# Patient Record
Sex: Female | Born: 1950
Health system: Southern US, Community
[De-identification: ages and names within clinical notes are randomized; demographics above are authoritative.]

## PROBLEM LIST (undated history)

## (undated) DIAGNOSIS — I493 Ventricular premature depolarization: Secondary | ICD-10-CM

## (undated) DIAGNOSIS — R490 Dysphonia: Secondary | ICD-10-CM

## (undated) DIAGNOSIS — K219 Gastro-esophageal reflux disease without esophagitis: Secondary | ICD-10-CM

## (undated) DIAGNOSIS — G4762 Sleep related leg cramps: Secondary | ICD-10-CM

## (undated) DIAGNOSIS — J040 Acute laryngitis: Secondary | ICD-10-CM

## (undated) DIAGNOSIS — K5792 Diverticulitis of intestine, part unspecified, without perforation or abscess without bleeding: Secondary | ICD-10-CM

## (undated) DIAGNOSIS — E785 Hyperlipidemia, unspecified: Secondary | ICD-10-CM

## (undated) DIAGNOSIS — J3089 Other allergic rhinitis: Secondary | ICD-10-CM

## (undated) DIAGNOSIS — H269 Unspecified cataract: Secondary | ICD-10-CM

## (undated) DIAGNOSIS — M5030 Other cervical disc degeneration, unspecified cervical region: Secondary | ICD-10-CM

## (undated) DIAGNOSIS — R0982 Postnasal drip: Secondary | ICD-10-CM

## (undated) DIAGNOSIS — M810 Age-related osteoporosis without current pathological fracture: Secondary | ICD-10-CM

## (undated) DIAGNOSIS — E039 Hypothyroidism, unspecified: Secondary | ICD-10-CM

## (undated) DIAGNOSIS — E559 Vitamin D deficiency, unspecified: Secondary | ICD-10-CM

## (undated) DIAGNOSIS — E079 Disorder of thyroid, unspecified: Secondary | ICD-10-CM

## (undated) DIAGNOSIS — N393 Stress incontinence (female) (male): Secondary | ICD-10-CM

## (undated) DIAGNOSIS — F329 Major depressive disorder, single episode, unspecified: Secondary | ICD-10-CM

## (undated) HISTORY — DX: Postnasal drip: R09.82

## (undated) HISTORY — DX: Vitamin D deficiency, unspecified: E55.9

## (undated) HISTORY — DX: Stress incontinence (female) (male): N39.3

## (undated) HISTORY — DX: Acute laryngitis: J04.0

## (undated) HISTORY — PX: OTHER SURGICAL HISTORY: SHX169

## (undated) HISTORY — DX: Other allergic rhinitis: J30.89

## (undated) HISTORY — DX: Ventricular premature depolarization: I49.3

## (undated) HISTORY — DX: Hypothyroidism, unspecified: E03.9

## (undated) HISTORY — DX: Hyperlipidemia, unspecified: E78.5

## (undated) HISTORY — DX: Sleep related leg cramps: G47.62

## (undated) HISTORY — DX: Unspecified cataract: H26.9

## (undated) HISTORY — PX: TONSILLECTOMY: SUR1361

## (undated) HISTORY — PX: WISDOM TOOTH EXTRACTION: SHX21

## (undated) HISTORY — PX: BREAST SURGERY: SHX581

## (undated) HISTORY — DX: Dysphonia: R49.0

## (undated) HISTORY — DX: Major depressive disorder, single episode, unspecified: F32.9

## (undated) HISTORY — DX: Other cervical disc degeneration, unspecified cervical region: M50.30

## (undated) HISTORY — PX: LIPOSUCTION: SHX10

## (undated) HISTORY — DX: Age-related osteoporosis without current pathological fracture: M81.0

## (undated) HISTORY — DX: Gastro-esophageal reflux disease without esophagitis: K21.9

---

## 1981-11-18 HISTORY — PX: COMBINED HYSTERECTOMY VAGINAL W/ MMK / A&P REPAIR: SUR296

## 1989-11-18 HISTORY — PX: BREAST EXCISIONAL BIOPSY: SUR124

## 1998-04-20 ENCOUNTER — Other Ambulatory Visit: Admission: RE | Admit: 1998-04-20 | Discharge: 1998-04-20 | Payer: Self-pay | Admitting: Obstetrics and Gynecology

## 1999-04-20 ENCOUNTER — Other Ambulatory Visit: Admission: RE | Admit: 1999-04-20 | Discharge: 1999-04-20 | Payer: Self-pay | Admitting: Obstetrics and Gynecology

## 2001-02-18 ENCOUNTER — Ambulatory Visit (HOSPITAL_BASED_OUTPATIENT_CLINIC_OR_DEPARTMENT_OTHER): Admission: RE | Admit: 2001-02-18 | Discharge: 2001-02-18 | Payer: Self-pay | Admitting: *Deleted

## 2001-02-18 ENCOUNTER — Encounter (INDEPENDENT_AMBULATORY_CARE_PROVIDER_SITE_OTHER): Payer: Self-pay | Admitting: *Deleted

## 2001-07-03 ENCOUNTER — Other Ambulatory Visit: Admission: RE | Admit: 2001-07-03 | Discharge: 2001-07-03 | Payer: Self-pay | Admitting: Obstetrics and Gynecology

## 2004-09-24 ENCOUNTER — Ambulatory Visit (HOSPITAL_COMMUNITY): Admission: RE | Admit: 2004-09-24 | Discharge: 2004-09-24 | Payer: Self-pay | Admitting: *Deleted

## 2004-11-18 DIAGNOSIS — F32A Depression, unspecified: Secondary | ICD-10-CM

## 2004-11-18 DIAGNOSIS — M5030 Other cervical disc degeneration, unspecified cervical region: Secondary | ICD-10-CM

## 2004-11-18 HISTORY — DX: Depression, unspecified: F32.A

## 2004-11-18 HISTORY — DX: Other cervical disc degeneration, unspecified cervical region: M50.30

## 2005-03-29 ENCOUNTER — Encounter: Admission: RE | Admit: 2005-03-29 | Discharge: 2005-03-29 | Payer: Self-pay | Admitting: *Deleted

## 2005-06-27 ENCOUNTER — Encounter: Admission: RE | Admit: 2005-06-27 | Discharge: 2005-06-27 | Payer: Self-pay | Admitting: *Deleted

## 2005-10-14 ENCOUNTER — Encounter: Payer: Self-pay | Admitting: Obstetrics and Gynecology

## 2006-09-18 DIAGNOSIS — E559 Vitamin D deficiency, unspecified: Secondary | ICD-10-CM

## 2006-09-18 HISTORY — DX: Vitamin D deficiency, unspecified: E55.9

## 2006-10-15 ENCOUNTER — Encounter: Admission: RE | Admit: 2006-10-15 | Discharge: 2006-10-15 | Payer: Self-pay | Admitting: Obstetrics and Gynecology

## 2006-10-18 DIAGNOSIS — N393 Stress incontinence (female) (male): Secondary | ICD-10-CM

## 2006-10-18 HISTORY — DX: Stress incontinence (female) (male): N39.3

## 2007-11-11 ENCOUNTER — Encounter: Admission: RE | Admit: 2007-11-11 | Discharge: 2007-11-11 | Payer: Self-pay | Admitting: Obstetrics and Gynecology

## 2008-01-22 ENCOUNTER — Encounter: Admission: RE | Admit: 2008-01-22 | Discharge: 2008-01-22 | Payer: Self-pay | Admitting: Obstetrics and Gynecology

## 2008-01-25 ENCOUNTER — Ambulatory Visit (HOSPITAL_COMMUNITY): Admission: RE | Admit: 2008-01-25 | Discharge: 2008-01-25 | Payer: Self-pay | Admitting: *Deleted

## 2008-02-12 ENCOUNTER — Ambulatory Visit (HOSPITAL_COMMUNITY): Admission: RE | Admit: 2008-02-12 | Discharge: 2008-02-12 | Payer: Self-pay | Admitting: *Deleted

## 2008-02-12 ENCOUNTER — Encounter (INDEPENDENT_AMBULATORY_CARE_PROVIDER_SITE_OTHER): Payer: Self-pay | Admitting: *Deleted

## 2008-07-28 ENCOUNTER — Ambulatory Visit (HOSPITAL_COMMUNITY): Admission: RE | Admit: 2008-07-28 | Discharge: 2008-07-28 | Payer: Self-pay | Admitting: *Deleted

## 2008-10-07 ENCOUNTER — Encounter: Admission: RE | Admit: 2008-10-07 | Discharge: 2008-10-07 | Payer: Self-pay | Admitting: Obstetrics & Gynecology

## 2008-11-04 ENCOUNTER — Ambulatory Visit (HOSPITAL_COMMUNITY): Admission: RE | Admit: 2008-11-04 | Discharge: 2008-11-04 | Payer: Self-pay | Admitting: *Deleted

## 2008-11-07 ENCOUNTER — Ambulatory Visit (HOSPITAL_BASED_OUTPATIENT_CLINIC_OR_DEPARTMENT_OTHER): Admission: RE | Admit: 2008-11-07 | Discharge: 2008-11-07 | Payer: Self-pay | Admitting: *Deleted

## 2009-05-15 ENCOUNTER — Ambulatory Visit (HOSPITAL_COMMUNITY): Admission: RE | Admit: 2009-05-15 | Discharge: 2009-05-15 | Payer: Self-pay | Admitting: *Deleted

## 2009-05-26 ENCOUNTER — Encounter: Admission: RE | Admit: 2009-05-26 | Discharge: 2009-05-26 | Payer: Self-pay | Admitting: Obstetrics and Gynecology

## 2009-10-18 HISTORY — PX: HAND SURGERY: SHX662

## 2009-11-01 ENCOUNTER — Encounter: Admission: RE | Admit: 2009-11-01 | Discharge: 2009-11-01 | Payer: Self-pay | Admitting: Obstetrics and Gynecology

## 2010-02-16 ENCOUNTER — Encounter: Admission: RE | Admit: 2010-02-16 | Discharge: 2010-02-16 | Payer: Self-pay | Admitting: Obstetrics and Gynecology

## 2010-11-08 ENCOUNTER — Encounter
Admission: RE | Admit: 2010-11-08 | Discharge: 2010-11-08 | Payer: Self-pay | Source: Home / Self Care | Attending: Obstetrics and Gynecology | Admitting: Obstetrics and Gynecology

## 2010-12-09 ENCOUNTER — Encounter: Payer: Self-pay | Admitting: *Deleted

## 2011-04-02 NOTE — Op Note (Signed)
NAMEDUBLIN, CANTERO NO.:  [id number]   MEDICAL RECORD NO.:  [id number]          PATIENT TYPE:  AMB   LOCATION:  ENDO                         FACILITY:  [facility]   PHYSICIAN:  Georgiana Spinner, M.D.    DATE OF BIRTH:  [birth year]   DATE OF PROCEDURE:  05/15/2009  DATE OF DISCHARGE:                               OPERATIVE REPORT   PROCEDURE:  Colonoscopy.   INDICATIONS:  Diarrhea.   ANESTHESIA:  Fentanyl 100 mcg, Versed 12 mg.   PROCEDURE:  With the patient in the left lateral decubitus position  before sedation was given, a rectal examination was performed.  Her  rectal tone was mild to moderately decreased on intentional contracture.  Subsequently, the Pentax videoscopic pediatric colonoscope was inserted  in the rectum after sedation was given and passed under direct vision  with pressure applied to reach the cecum identified by ileocecal valve  and appendiceal orifice, both of which were photographed.  From this  point, the colonoscope was slowly withdrawn taking circumferential views  of colonic mucosa stopping only in the rectum to photograph an  occasional diverticulum along the way until we reached the rectum which  appeared normal on direct and showed hemorrhoids on retroflexed view.  The endoscope was straightened and withdrawn.  The patient's vital signs  and pulse oximeter remained stable.  The patient tolerated the procedure  well without apparent complication.   FINDINGS:  Small internal hemorrhoids, occasional diverticulum,  otherwise a fairly unremarkable exam.   PLAN:  Have the patient follow up with me as needed.  Suggest Kegel  exercises.           ______________________________  Georgiana Spinner, M.D.     GMO/MEDQ  D:  05/15/2009  T:  05/15/2009  Job:  [id number]

## 2011-04-02 NOTE — Op Note (Signed)
Sharon Harrell, Sharon Harrell                ACCOUNT NO.:  [id number]   MEDICAL RECORD NO.:  [id number]          PATIENT TYPE:  AMB   LOCATION:  DSC                          FACILITY:  [facility]   PHYSICIAN:  Tennis Must Meyerdierks, M.D.DATE OF BIRTH:  September 14, 1951   DATE OF PROCEDURE:  11/07/2008  DATE OF DISCHARGE:                               OPERATIVE REPORT   PREOPERATIVE DIAGNOSIS:  Scapholunate ligament tear, left wrist.   POSTOPERATIVE DIAGNOSIS:  Scapholunate ligament tear, left wrist.   PROCEDURE:  Arthroscopic debridement with percutaneous pinning of  scapholunate joint, left wrist.   SURGEON:  Lowell Bouton, MD   ANESTHESIA:  General.   OPERATIVE FINDINGS:  The patient had a moderate amount of dorsal  synovitis.  The scapholunate ligament was torn in the central portion.  There was no instability of the scapholunate joint.  The tear did appear  to be subacute as opposed to chronic as there were no degenerative  changes.  The radiocarpal joint was completely smooth as was the  triangular fiber cartilage and the midcarpal joint.   PROCEDURE:  Under general anesthesia with a tourniquet on the left arm,  the left hand was prepped and draped in usual fashion and the index,  long, and ring fingers were suspended from finger traps in the  arthroscopy tower.  Ten pounds of traction was applied across the wrist.  The radiocarpal joint was inflated with 10 mL of fluid and an outflow  was established without difficulty in the 6U portal.  An 11 blade was  used to make a stab wound over the dorsal 3/4 portal and blunt  dissection was carried down to the capsule.  A sharp trocar and blunt  trocar were used to get the arthroscope into the radiocarpal joint.  A  pump was attached for fluid and there was good flow.  Initial  examination revealed the radiocarpal joint to be smooth and the volar  ligaments to be intact.  Going ulnarly, the TFC was intact and the  articular surface of  the scaphoid lunate and radius were quite smooth.  A small scapholunate tear was identified and so a shaver was inserted in  a 4/5 portal.  The synovitis was debrided and a nerve hook was inserted  in the 4/5 portal.  The tear was examined with the nerve hook and there  was no gross instability noted.  The scaphoid was stressed manually and  there was no instability that could be seen through the arthroscope.  At  this point, the arthroscope was removed and then through the same, skin  incision was passed into the midcarpal portal.  The scapholunate joint  was examined from the midcarpal portal and the articular surfaces of the  capitate, scaphoid, and lunate were smooth.  A probe was placed in the  ulnar mid carpal portal and the scapholunate joint was examined with the  probe.  There was no widening or opening for the probe to go through.  There was poor outflow through the ulnocarpal outflow with the pump  inflating the midcarpal joint.  The midcarpal portal instruments  were  then removed and the radiocarpal portal was entered again.  The dorsal  synovium was debrided with a synovial shaver that was placed in the 4/5  portal.  The edges of the scapholunate ligament were debrided with the  shaver.  At this point, it was determined that it would not be necessary  to perform an arthrotomy; however, I felt that the safest thing to do  was to pin the scaphoid and lunate joints to allow for healing of the  ligament tear.  The fluoroscopy unit was brought in and a 0.045 K-wire  x2 was inserted through the snuff box into the scaphoid and into the  lunate holding lunate in neutral position.  The pins were bent over left  protruding from the skin, a 0.25% Marcaine was placed in  the portals for pain control and in the wrist.  The skin portals were  closed with 4-0 nylon sutures.  Sterile dressings were applied followed  by a thumb spica splint.  The patient tolerated the procedure well and  left  the recovery room awake in stable and good condition.      Lowell Bouton, M.D.  Electronically Signed     EMM/MEDQ  D:  11/07/2008  T:  11/08/2008  Job:  [id number]

## 2011-04-02 NOTE — Op Note (Signed)
Sharon Harrell, DENNEN NO.:  [id number]   MEDICAL RECORD NO.:  [id number]          PATIENT TYPE:  AMB   LOCATION:  ENDO                         FACILITY:  [facility]   PHYSICIAN:  Georgiana Spinner, M.D.    DATE OF BIRTH:  1951-02-08   DATE OF PROCEDURE:  DATE OF DISCHARGE:                               OPERATIVE REPORT   PROCEDURE:  Upper endoscopy.   INDICATIONS:  Dysphagia.   ANESTHESIA:  Fentanyl 50 mcg, Versed 8 mg.   PROCEDURE:  With the patient mildly sedated in the left lateral  decubitus position, the Pentax videoscopic endoscope was inserted in the  mouth, passed under direct vision through the esophagus which appeared  normal.  There was no evidence of esophagitis or Barrett's esophagus  noted.  We entered into the stomach. The fundus, body, antrum, duodenal  bulb and second portion of duodenum were visualized. From this point,  the endoscope was slowly withdrawn taking circumferential views of the  duodenal mucosa until the endoscope had been pulled back in the stomach  placed in retroflexion to view the stomach from below. The endoscope was  straightened and withdrawn taking circumferential views of the remaining  gastric and esophageal mucosa stopping in the fundus of the stomach  where some mild erythematous and edematous changes were noted,  photographed and biopsied. The patient's vital signs and pulse oximeter  remained stable.  The patient tolerated the procedure well without  apparent complications.   FINDINGS:  Mild changes of gastritis seen in the fundus of the stomach  biopsied.  There was no evidence of stricture but there did appear to be  slightly decreased peristalsis noted consistent with possibly some mild  degree of dysmotility. At this point, we will continue the patient on  PPI therapy and have patient follow-up with me as an outpatient as  needed.           ______________________________  Georgiana Spinner, M.D.     GMO/MEDQ  D:   02/12/2008  T:  02/13/2008  Job:  [id number]

## 2011-04-02 NOTE — Op Note (Signed)
NAMEDOROTHEA, YOW NO.:  [id number]   MEDICAL RECORD NO.:  [id number]          PATIENT TYPE:  AMB   LOCATION:  ENDO                         FACILITY:  [facility]   PHYSICIAN:  Georgiana Spinner, M.D.    DATE OF BIRTH:  07-14-1951   DATE OF PROCEDURE:  DATE OF DISCHARGE:                               OPERATIVE REPORT   PROCEDURE:  Upper endoscopy with biopsy.   INDICATIONS:  Recurrent H. pylori gastritis.   ANESTHESIA:  1. Fentanyl 75 mcg.  2. Versed 7.5 mg.   PROCEDURE:  With the patient mildly sedated in the left lateral  decubitus position, the Pentax videoscopic endoscope was inserted in the  mouth and passed under direct vision through the esophagus which  appeared normal into the stomach fundus, body, antrum, duodenal bulb,  and second portion of the duodenum, all appeared normal.  From this  point, the endoscope was slowly withdrawn taking circumferential views  of duodenal mucosa until the endoscope had been pulled back and the  stomach placed in retroflexion to view the stomach from below.  The  endoscope was straightened and withdrawn and taking circumferential  views of the remaining gastric and esophageal mucosa stopping in the  antrum, body and fundus to take biopsies of what appeared to be normal-  appearing mucosa to check for H. pylori culture, which was placed in a  sterile container with sterile water.  The endoscope was then withdrawn.  The patient's vital signs, pulse oximeter remained stable.  The patient  tolerated the procedure well without apparent complications.   FINDINGS:  Essentially negative examination at this time with biopsies  taken as above.  Await biopsy reports of H. pylori culture.           ______________________________  Georgiana Spinner, M.D.     GMO/MEDQ  D:  07/28/2008  T:  07/28/2008  Job:  [id number]

## 2011-04-05 NOTE — Op Note (Signed)
[place]  Patient:    Sharon Harrell, Sharon Harrell                         MRN: [id number] Proc. Date: 02/18/01 Attending:  Alfonse Flavors, M.D.                           Operative Report  PREOPERATIVE DIAGNOSIS:  Nasopharyngeal cyst.  POSTOPERATIVE DIAGNOSIS:  Nasopharyngeal cyst.  OPERATION PERFORMED:  Biopsy of the nasopharynx.  SURGEON:  Alfonse Flavors, M.D.  ANESTHESIA:  General endotracheal.  INDICATIONS FOR PROCEDURE:  Sharon Harrell is a 60 year old patient who presented to our office in May 2001.  She had a two to three month history of a foreign body sensation in her left ear.  During her initial physical examination, flexible fiberoptic nasopharyngoscopy and laryngoscopy were performed.  A smooth convex area was noted on the posterior pharyngeal wall which was consistent with a mucous retention cyst.  [name] was followed with intermittent re-examination of the nasopharynx.  The cystic area began to be protuberant.  Because of its growth, further evaluation was indicated.  A CT scan of the neck showed no evidence of abnormality.  [name] was advised to undergo biopsy of the area to determine the nature of the lesion.  The indications and complications of the procedure were discussed in detail.  DESCRIPTION OF PROCEDURE:  The patient was brought to the operating room and placed supine on the operating table.  She was induced for general anesthesia, intubated with an orotracheal tube.  The face was draped in a sterile fashion. The mouth was opened with a Crowe-Davis mouth gag.  The palate was elevated with a red rubber catheter.  Under visualization by mirror a curved adenoid punch was used to open the anterior surface of the cystic mass.  A small amount of thick, yellow mucus was released.  That specimen and additional specimens from the margins of the cystic area were submitted for examination by pathology.  The cyst was marsupialized with  adenoid forceps.  The area was cauterized with suction cautery.  The pharynx was suctioned free of debris. The patient tolerated the procedure well and was taken to the recovery room in satisfactory condition.  FOLLOW-UP:  [name] will use amoxicillin over the next week. She will be re-examined in our office on February 24, 2001. DD:  02/18/01 TD:  02/18/01 Job: [id number]

## 2011-04-05 NOTE — Op Note (Signed)
Sharon Harrell, Sharon Harrell                ACCOUNT NO.:  [id number]   MEDICAL RECORD NO.:  [id number]          PATIENT TYPE:  AMB   LOCATION:  ENDO                         FACILITY:  [facility]   PHYSICIAN:  Georgiana Spinner, M.D.    DATE OF BIRTH:  1950/12/29   DATE OF PROCEDURE:  09/24/2004  DATE OF DISCHARGE:                                 OPERATIVE REPORT   PROCEDURE:  Colonoscopy.   ENDOSCOPIST:  Georgiana Spinner, M.D.   INDICATIONS FOR PROCEDURE:  Colon cancer screening.   ANESTHESIA:  Demerol 70 mg, Versed 6 mg.   DESCRIPTION OF PROCEDURE:  With the patient mildly sedated in the left  lateral decubitus position, the Olympus videoscopic colonoscope was inserted  into the rectum and passed under direct vision to the cecum, identified by  the ileocecal valve and the appendiceal orifice, both of which were  photographed.  From this point the colonoscope was slowly withdrawn, taking  circumferential views of the colonic mucosa, stopping only in the rectum,  which appeared normal on direct and retroflexed view.  The endoscope was  straightened and withdrawn.  The patient's vital signs and pulse oximeter  remained stable.  The patient tolerated the procedure well without apparent complications.   FINDINGS:  This is an unremarkable colonoscopic examination to the cecum.   PLAN:  Consider repeat examination in five to 10 years.      GMO/MEDQ  D:  09/24/2004  T:  09/24/2004  Job:  [id number]   cc:   Oley Balm. Georgina Pillion, M.D.  [address]  [place]  [place] [address]  Fax: [phone]

## 2011-04-08 ENCOUNTER — Other Ambulatory Visit: Payer: Self-pay | Admitting: Internal Medicine

## 2011-04-08 ENCOUNTER — Ambulatory Visit
Admission: RE | Admit: 2011-04-08 | Discharge: 2011-04-08 | Disposition: A | Discharge status: Home / Self Care | Payer: 59 | Source: Ambulatory Visit | Attending: Internal Medicine | Admitting: Internal Medicine

## 2011-04-08 DIAGNOSIS — L539 Erythematous condition, unspecified: Secondary | ICD-10-CM

## 2011-04-10 ENCOUNTER — Other Ambulatory Visit: Payer: Self-pay | Admitting: Internal Medicine

## 2011-04-10 DIAGNOSIS — L539 Erythematous condition, unspecified: Secondary | ICD-10-CM

## 2011-04-20 ENCOUNTER — Other Ambulatory Visit: Payer: 59

## 2011-04-23 ENCOUNTER — Ambulatory Visit
Admission: RE | Admit: 2011-04-23 | Discharge: 2011-04-23 | Disposition: A | Discharge status: Home / Self Care | Payer: Self-pay | Source: Ambulatory Visit | Attending: Internal Medicine | Admitting: Internal Medicine

## 2011-04-23 DIAGNOSIS — L539 Erythematous condition, unspecified: Secondary | ICD-10-CM

## 2011-04-23 MED ORDER — GADOBENATE DIMEGLUMINE 529 MG/ML IV SOLN
11.0000 mL | Freq: Once | INTRAVENOUS | Status: AC | PRN
  Administered 2011-04-23: 11 mL via INTRAVENOUS

## 2011-05-30 ENCOUNTER — Emergency Department (HOSPITAL_COMMUNITY)
Admission: EM | Admit: 2011-05-30 | Discharge: 2011-05-31 | Disposition: A | Discharge status: Home / Self Care | Payer: 59 | Attending: Emergency Medicine | Admitting: Emergency Medicine

## 2011-05-30 ENCOUNTER — Emergency Department (HOSPITAL_COMMUNITY): Payer: 59

## 2011-05-30 ENCOUNTER — Encounter (HOSPITAL_COMMUNITY): Payer: Self-pay | Admitting: Radiology

## 2011-05-30 DIAGNOSIS — R197 Diarrhea, unspecified: Secondary | ICD-10-CM | POA: Insufficient documentation

## 2011-05-30 DIAGNOSIS — R630 Anorexia: Secondary | ICD-10-CM | POA: Insufficient documentation

## 2011-05-30 DIAGNOSIS — E039 Hypothyroidism, unspecified: Secondary | ICD-10-CM | POA: Insufficient documentation

## 2011-05-30 DIAGNOSIS — R509 Fever, unspecified: Secondary | ICD-10-CM | POA: Insufficient documentation

## 2011-05-30 DIAGNOSIS — K5732 Diverticulitis of large intestine without perforation or abscess without bleeding: Secondary | ICD-10-CM | POA: Insufficient documentation

## 2011-05-30 DIAGNOSIS — Z79899 Other long term (current) drug therapy: Secondary | ICD-10-CM | POA: Insufficient documentation

## 2011-05-30 DIAGNOSIS — R112 Nausea with vomiting, unspecified: Secondary | ICD-10-CM | POA: Insufficient documentation

## 2011-05-30 DIAGNOSIS — R1032 Left lower quadrant pain: Secondary | ICD-10-CM | POA: Insufficient documentation

## 2011-05-30 DIAGNOSIS — K625 Hemorrhage of anus and rectum: Secondary | ICD-10-CM | POA: Insufficient documentation

## 2011-05-30 LAB — CBC
Hemoglobin: 13.9 g/dL (ref 12.0–15.0)
MCHC: 36.5 g/dL — ABNORMAL HIGH (ref 30.0–36.0)
Platelets: 175 10*3/uL (ref 150–400)
RBC: 4.41 MIL/uL (ref 3.87–5.11)
RDW: 13 % (ref 11.5–15.5)

## 2011-05-30 LAB — COMPREHENSIVE METABOLIC PANEL
Albumin: 3.5 g/dL (ref 3.5–5.2)
Alkaline Phosphatase: 72 U/L (ref 39–117)
CO2: 27 mEq/L (ref 19–32)
Calcium: 8.6 mg/dL (ref 8.4–10.5)
GFR calc Af Amer: >60 mL/min (ref >60)
GFR calc non Af Amer: >60 mL/min (ref >60)
Glucose, Bld: 98 mg/dL (ref 70–99)
Potassium: 3.8 mEq/L (ref 3.5–5.1)
Total Bilirubin: 0.5 mg/dL (ref 0.3–1.2)

## 2011-05-30 LAB — DIFFERENTIAL
Basophils Absolute: 0 10*3/uL (ref 0.0–0.1)
Basophils Relative: 0 % (ref 0–1)
Eosinophils Absolute: 0.1 10*3/uL (ref 0.0–0.7)
Lymphs Abs: 1.5 10*3/uL (ref 0.7–4.0)
Neutrophils Relative %: 76 % (ref 43–77)

## 2011-05-30 LAB — POCT I-STAT, CHEM 8
BUN: 13 mg/dL (ref 6–23)
Calcium, Ion: 1.11 mmol/L — ABNORMAL LOW (ref 1.12–1.32)

## 2011-05-30 MED ORDER — IOHEXOL 300 MG/ML  SOLN
80.0000 mL | Freq: Once | INTRAMUSCULAR | Status: AC | PRN
  Administered 2011-05-30: 80 mL via INTRAVENOUS

## 2011-08-21 LAB — MISCELLANEOUS TEST

## 2011-10-28 ENCOUNTER — Other Ambulatory Visit: Payer: Self-pay

## 2011-10-28 ENCOUNTER — Emergency Department (HOSPITAL_COMMUNITY)
Admission: EM | Admit: 2011-10-28 | Discharge: 2011-10-28 | Disposition: A | Discharge status: Home / Self Care | Payer: BC Managed Care – PPO | Attending: Emergency Medicine | Admitting: Emergency Medicine

## 2011-10-28 ENCOUNTER — Encounter (HOSPITAL_COMMUNITY): Payer: Self-pay | Admitting: *Deleted

## 2011-10-28 DIAGNOSIS — R109 Unspecified abdominal pain: Secondary | ICD-10-CM | POA: Insufficient documentation

## 2011-10-28 DIAGNOSIS — R112 Nausea with vomiting, unspecified: Secondary | ICD-10-CM | POA: Insufficient documentation

## 2011-10-28 DIAGNOSIS — M79609 Pain in unspecified limb: Secondary | ICD-10-CM | POA: Insufficient documentation

## 2011-10-28 DIAGNOSIS — Z9079 Acquired absence of other genital organ(s): Secondary | ICD-10-CM | POA: Insufficient documentation

## 2011-10-28 DIAGNOSIS — R079 Chest pain, unspecified: Secondary | ICD-10-CM | POA: Insufficient documentation

## 2011-10-28 HISTORY — DX: Diverticulitis of intestine, part unspecified, without perforation or abscess without bleeding: K57.92

## 2011-10-28 HISTORY — DX: Disorder of thyroid, unspecified: E07.9

## 2011-10-28 LAB — CBC
HCT: 44 % (ref 36.0–46.0)
MCHC: 34.5 g/dL (ref 30.0–36.0)
MCV: 88 fL (ref 78.0–100.0)
RDW: 13.1 % (ref 11.5–15.5)

## 2011-10-28 LAB — URINE MICROSCOPIC-ADD ON

## 2011-10-28 LAB — TROPONIN I: Troponin I: <0.3 ng/mL (ref <0.30)

## 2011-10-28 LAB — URINALYSIS, ROUTINE W REFLEX MICROSCOPIC
Bilirubin Urine: NEGATIVE
Nitrite: NEGATIVE
Specific Gravity, Urine: 1.015 (ref 1.005–1.030)
Urobilinogen, UA: 0.2 mg/dL (ref 0.0–1.0)

## 2011-10-28 LAB — COMPREHENSIVE METABOLIC PANEL
Albumin: 4 g/dL (ref 3.5–5.2)
BUN: 21 mg/dL (ref 6–23)
Calcium: 9.8 mg/dL (ref 8.4–10.5)
Creatinine, Ser: 0.83 mg/dL (ref 0.50–1.10)
Total Protein: 6.9 g/dL (ref 6.0–8.3)

## 2011-10-28 LAB — LIPASE, BLOOD: Lipase: 28 U/L (ref 11–59)

## 2011-10-28 MED ORDER — ONDANSETRON HCL 4 MG PO TABS
4.0000 mg | ORAL_TABLET | Freq: Once | ORAL | Status: AC
  Administered 2011-10-28: 4 mg via ORAL
  Filled 2011-10-28: qty 1

## 2011-10-28 MED ORDER — SODIUM CHLORIDE 0.9 % IV BOLUS (SEPSIS)
1000.0000 mL | Freq: Once | INTRAVENOUS | Status: AC
  Administered 2011-10-28: 1000 mL via INTRAVENOUS

## 2011-10-28 NOTE — ED Provider Notes (Signed)
History     CSN: [id number] Arrival date & time: 10/28/2011  3:15 AM   First MD Initiated Contact with Patient 10/28/11 [phone]      Chief Complaint  Patient presents with  . Nausea  . Emesis  . Chest Pain  . Extremity Pain    (Consider location/radiation/quality/duration/timing/severity/associated sxs/prior treatment) The history is provided by the patient.   patient reports she developed upper abdominal pain this evening with associated nausea and vomiting without diarrhea.  She reports abdominal pain is improving now however she had multiple episodes of vomiting through the evening and is present in the ER for evaluation.  She still nauseated at this time.  Her vomitus described as nonbloody and nonbilious.  She's had no diarrhea or recent sick contacts.  She denies fever hours shows report chills.  She works on the way to the emergency department she did mild chest pain that radiated to her back in her right arm that has since resolved.  She reports her abdomen is nontender but does feel distended.  She's never had problems with her gallbladder that she knows of.  She does not have symptoms worsened by food.  Nothing worsens her symptoms.  Nothing improves her symptoms.  Her symptoms are constant.  Past Medical History  Diagnosis Date  . Thyroid disease   . Diverticulitis     Past Surgical History  Procedure Date  . Abdominal hysterectomy   . Hand surgery     S-L     History reviewed. No pertinent family history.  History  Substance Use Topics  . Smoking status: Never Smoker   . Smokeless tobacco: Not on file  . Alcohol Use: Yes     occaisional    OB History    Grav Para Term Preterm Abortions TAB SAB Ect Mult Living                  Review of Systems  Cardiovascular: Positive for chest pain.  Gastrointestinal: Positive for vomiting.  All other systems reviewed and are negative.    Allergies  Review of patient's allergies indicates no known  allergies.  Home Medications   Current Outpatient Rx  Name Route Sig Dispense Refill  . ALLEGRA PO Oral Take by mouth.      . OMEGA-3 FATTY ACIDS 1000 MG PO CAPS Oral Take 1 g by mouth daily.      Marland Kitchen LEVOTHYROXINE SODIUM 75 MCG PO TABS Oral Take 75 mcg by mouth daily.      Marland Kitchen ONE-DAILY MULTI VITAMINS PO TABS Oral Take 1 tablet by mouth daily.      Marland Kitchen VITAMIN D (ERGOCALCIFEROL) 50000 UNITS PO CAPS Oral Take 5,000 Units by mouth.        BP 94/65  Pulse 93  Temp(Src) 98.3 F (36.8 C) (Oral)  Resp 20  Ht 5\' 6"  (1.676 m)  Wt 132 lb (59.875 kg)  BMI 21.31 kg/m2  SpO2 98%  Physical Exam  Nursing note and vitals reviewed. Constitutional: She is oriented to person, place, and time. She appears well-developed and well-nourished. No distress.  HENT:  Head: Normocephalic and atraumatic.  Eyes: EOM are normal.  Neck: Normal range of motion.  Cardiovascular: Normal rate, regular rhythm and normal heart sounds.   Pulmonary/Chest: Effort normal and breath sounds normal.  Abdominal: Soft. She exhibits no distension. There is no tenderness. There is no rebound and no guarding.  Musculoskeletal: Normal range of motion.  Neurological: She is alert and oriented to person, place,  and time.  Skin: Skin is warm and dry.  Psychiatric: She has a normal mood and affect. Judgment normal.    ED Course  Procedures (including critical care time)   Date: 10/28/2011  Rate: 83  Rhythm: normal sinus rhythm  QRS Axis: normal  Intervals: normal  ST/T Wave abnormalities: nonspecific ST changes  Conduction Disutrbances:none  Narrative Interpretation:   Old EKG Reviewed:      Labs Reviewed  CBC - Abnormal; Notable for the following:    WBC 11.1 (*)    Hemoglobin 15.2 (*)    All other components within normal limits  COMPREHENSIVE METABOLIC PANEL - Abnormal; Notable for the following:    Glucose, Bld 114 (*)    GFR calc non Af Amer 76 (*)    GFR calc Af Amer 88 (*)    All other components within  normal limits  URINALYSIS, ROUTINE W REFLEX MICROSCOPIC - Abnormal; Notable for the following:    APPearance CLOUDY (*)    pH 8.5 (*)    Protein, ur TRACE (*)    All other components within normal limits  URINE MICROSCOPIC-ADD ON - Abnormal; Notable for the following:    Bacteria, UA FEW (*)    All other components within normal limits  LIPASE, BLOOD  TROPONIN I   No results found.   1. Nausea and vomiting       MDM  Her abdomen is benign on exam at this time.  Hydrate the patient and give her antibiotics.  Basic labs including CMP and lipase will be obtained.  Her EKG is normal.  I suspect her pain in her chest was secondary to her vomiting and not ACS.  Urine pending  6:48 AM The patient feels much better at this time.  Her urine is without signs of infection.  She reports her nausea is gone.  Repeat abdominal exam is benign.  Discharge him at this time.  Patient reports she has both pain medicine and nausea medicine, we'll take those as needed for her symptoms.        Lyanne Co, MD 10/28/11 [phone]

## 2011-10-28 NOTE — ED Notes (Signed)
Pt reports improvement in nausea.  Ambulated to BR with no difficulty.

## 2011-10-28 NOTE — ED Notes (Signed)
Pt c/o n/v, chest pain radiating to her back, and right arm pain x 2 hrs

## 2011-10-28 NOTE — ED Notes (Signed)
Pt reports nausea and vomiting starting tonight.  Also reports one episode of chest pain radiating into right shoulder and into back.  No distress noted at present time.  IV started, labs drawn.

## 2011-10-31 ENCOUNTER — Other Ambulatory Visit (HOSPITAL_COMMUNITY): Payer: Self-pay

## 2012-04-28 ENCOUNTER — Other Ambulatory Visit: Payer: Self-pay | Admitting: Obstetrics and Gynecology

## 2012-04-28 DIAGNOSIS — Z1231 Encounter for screening mammogram for malignant neoplasm of breast: Secondary | ICD-10-CM

## 2012-05-06 ENCOUNTER — Ambulatory Visit
Admission: RE | Admit: 2012-05-06 | Discharge: 2012-05-06 | Disposition: A | Discharge status: Home / Self Care | Payer: BC Managed Care – PPO | Source: Ambulatory Visit | Attending: Obstetrics and Gynecology | Admitting: Obstetrics and Gynecology

## 2012-05-06 DIAGNOSIS — Z1231 Encounter for screening mammogram for malignant neoplasm of breast: Secondary | ICD-10-CM

## 2013-04-19 ENCOUNTER — Other Ambulatory Visit: Payer: Self-pay

## 2013-04-19 DIAGNOSIS — Z1231 Encounter for screening mammogram for malignant neoplasm of breast: Secondary | ICD-10-CM

## 2013-05-25 ENCOUNTER — Other Ambulatory Visit: Payer: Self-pay

## 2013-05-25 DIAGNOSIS — Z1231 Encounter for screening mammogram for malignant neoplasm of breast: Secondary | ICD-10-CM

## 2013-05-31 ENCOUNTER — Other Ambulatory Visit: Payer: Self-pay | Admitting: Internal Medicine

## 2013-05-31 DIAGNOSIS — Z780 Asymptomatic menopausal state: Secondary | ICD-10-CM

## 2013-06-22 ENCOUNTER — Ambulatory Visit: Payer: BC Managed Care – PPO

## 2013-06-23 ENCOUNTER — Ambulatory Visit
Admission: RE | Admit: 2013-06-23 | Discharge: 2013-06-23 | Disposition: A | Discharge status: Home / Self Care | Payer: BC Managed Care – PPO | Source: Ambulatory Visit | Attending: Internal Medicine | Admitting: Internal Medicine

## 2013-06-23 ENCOUNTER — Ambulatory Visit (INDEPENDENT_AMBULATORY_CARE_PROVIDER_SITE_OTHER): Payer: BC Managed Care – PPO | Admitting: Nurse Practitioner

## 2013-06-23 ENCOUNTER — Encounter: Payer: Self-pay | Admitting: Nurse Practitioner

## 2013-06-23 ENCOUNTER — Telehealth: Payer: Self-pay

## 2013-06-23 ENCOUNTER — Ambulatory Visit
Admission: RE | Admit: 2013-06-23 | Discharge: 2013-06-23 | Disposition: A | Discharge status: Home / Self Care | Payer: BC Managed Care – PPO | Source: Ambulatory Visit

## 2013-06-23 VITALS — BP 100/60 | HR 76 | Ht 66.0 in | Wt 130.0 lb

## 2013-06-23 DIAGNOSIS — Z1231 Encounter for screening mammogram for malignant neoplasm of breast: Secondary | ICD-10-CM

## 2013-06-23 DIAGNOSIS — M791 Myalgia, unspecified site: Secondary | ICD-10-CM

## 2013-06-23 DIAGNOSIS — IMO0001 Reserved for inherently not codable concepts without codable children: Secondary | ICD-10-CM

## 2013-06-23 DIAGNOSIS — Z01419 Encounter for gynecological examination (general) (routine) without abnormal findings: Secondary | ICD-10-CM

## 2013-06-23 DIAGNOSIS — Z780 Asymptomatic menopausal state: Secondary | ICD-10-CM

## 2013-06-23 NOTE — Patient Instructions (Signed)

## 2013-06-23 NOTE — Progress Notes (Signed)
Patient ID: Sharon Harrell, female   DOB: 08-11-1951, 62 y.o.   MRN: [id number] 62 y.o. G2, P2. Married Caucasian Fe here for annual exam.  She has now been off Femring for about 6 + months. Only occasional vaso symptoms and they are tolerable. Some increase in vaginal dryness. Recent health concerns about leg cramps and 'spasms'.  No LMP recorded. Patient has had a hysterectomy.          Sexually active: no  The current method of family planning is abstinence.    Exercising: yes  walking occasionally stair steps daily Smoker:  no  Health Maintenance: Pap:  Years ago, no abnormal pap before hysterectomy MMG:  05/06/12 normal, scheduled today Colonoscopy:  04/2009, normal, repeat in 5 years BMD:   02/16/10, normal, scheduled today TDaP:  09/2006 Labs: 03/2013, labs at PCP attached, Vit D is good   reports that she has never smoked. She does not have any smokeless tobacco history on file. She reports that  drinks alcohol. She reports that she does not use illicit drugs.  Past Medical History  Diagnosis Date  . Thyroid disease   . Diverticulitis     Past Surgical History  Procedure Laterality Date  . Abdominal hysterectomy    . Hand surgery      S-L     Current Outpatient Prescriptions  Medication Sig Dispense Refill  . Cholecalciferol (VITAMIN D-3) 5000 UNITS TABS Take 1 tablet by mouth daily.      Marland Kitchen Fexofenadine HCl (ALLEGRA PO) Take by mouth.        . fish oil-omega-3 fatty acids 1000 MG capsule Take 1 g by mouth daily.        Marland Kitchen levothyroxine (SYNTHROID, LEVOTHROID) 75 MCG tablet Take 75 mcg by mouth daily.        . Magnesium 100 MG CAPS Take 2.5 capsules by mouth daily.      . MOMETASONE FUROATE EX Apply topically daily as needed.      . Multiple Vitamin (MULTIVITAMIN) tablet Take 1 tablet by mouth daily.         No current facility-administered medications for this visit.    No family history on file.  ROS:  Pertinent items are noted in HPI.  Otherwise, a comprehensive ROS  was negative.  Exam:   BP 100/60  Pulse 76  Ht 5\' 6"  (1.676 m)  Wt 130 lb (58.968 kg)  BMI 20.99 kg/m2 Height: 5\' 6"  (167.6 cm)  Ht Readings from Last 3 Encounters:  06/23/13 5\' 6"  (1.676 m)  10/28/11 5\' 6"  (1.676 m)    General appearance: alert, cooperative and appears stated age Head: Normocephalic, without obvious abnormality, atraumatic Neck: no adenopathy, supple, symmetrical, trachea midline and thyroid normal to inspection and palpation Lungs: clear to auscultation bilaterally Breasts: normal appearance, no masses or tenderness Heart: regular rate and rhythm Abdomen: soft, non-tender; no masses,  no organomegaly Extremities: extremities normal, atraumatic, no cyanosis or edema Skin: Skin color, texture, turgor normal. No rashes or lesions Lymph nodes: Cervical, supraclavicular, and axillary nodes normal. No abnormal inguinal nodes palpated Neurologic: Grossly normal   Pelvic: External genitalia:  no lesions              Urethra:  normal appearing urethra with no masses, tenderness or lesions              Bartholin's and Skene's: normal                 Vagina: normal appearing  vagina with normal color and discharge, no lesions. Femring which she thought was removed earlier is now removed.              Cervix: absent              Pap taken: no Bimanual Exam:  Uterus:  uterus absent              Adnexa: no mass, fullness, tenderness               Rectovaginal: Confirms               Anus:  normal sphincter tone, no lesions  A:  Well Woman with normal exam  S/P TVH with  AP repair 1983, off ERT about 6+ months  Osteopenia with Vit D deficiency - followed by PCP  P:   Pap smear as per guidelines   Mammogram scheduled today  Did not refill her Femring since she is doing OK without active ring  With her issues of pain in various muscles and legs and feet she may want to see a rheumatologist. Patient called back and did want referral. It has been sent.  Counseled on  breast self exam, adequate intake of calcium and vitamin D, diet and exercise return annually or prn  An After Visit Summary was printed and given to the patient.

## 2013-06-23 NOTE — Telephone Encounter (Signed)
Patient was seen today.  Regarding referral to Dr. Jon Billings. Patient would like early A.M appointment only.

## 2013-06-25 ENCOUNTER — Encounter: Payer: Self-pay | Admitting: Nurse Practitioner

## 2013-06-25 NOTE — Progress Notes (Signed)
Encounter reviewed by Dr. Brook Silva.  

## 2013-06-30 ENCOUNTER — Telehealth: Payer: Self-pay | Admitting: *Deleted

## 2013-06-30 NOTE — Telephone Encounter (Signed)
Message copied by Luisa Dago on Wed Jun 30, 2013 11:51 AM ------      Message from: Ria Comment R      Created: Thu Jun 24, 2013  8:39 AM       Let patient know that there is no significant change in BMD from 2009 and 2011.  Spine is in the normal range and left femur neck is in the osteopenic range.  Still must get weight bearing exercise and walking up to 3-4 times weekly. Repeat in 2-3 years. ------

## 2013-06-30 NOTE — Telephone Encounter (Signed)
Message left to return call.  RE: lab results.

## 2013-07-02 NOTE — Telephone Encounter (Signed)
Advised pt of results.  She is agreeable.

## 2013-09-23 ENCOUNTER — Other Ambulatory Visit: Payer: Self-pay

## 2014-04-11 ENCOUNTER — Encounter (HOSPITAL_COMMUNITY): Payer: Self-pay | Admitting: Emergency Medicine

## 2014-04-11 ENCOUNTER — Emergency Department (INDEPENDENT_AMBULATORY_CARE_PROVIDER_SITE_OTHER)
Admission: EM | Admit: 2014-04-11 | Discharge: 2014-04-11 | Disposition: A | Discharge status: Home / Self Care | Payer: BC Managed Care – PPO | Source: Home / Self Care | Attending: Family Medicine | Admitting: Family Medicine

## 2014-04-11 DIAGNOSIS — W260XXA Contact with knife, initial encounter: Secondary | ICD-10-CM

## 2014-04-11 DIAGNOSIS — S61219A Laceration without foreign body of unspecified finger without damage to nail, initial encounter: Secondary | ICD-10-CM

## 2014-04-11 DIAGNOSIS — W261XXA Contact with sword or dagger, initial encounter: Secondary | ICD-10-CM

## 2014-04-11 DIAGNOSIS — Y93G1 Activity, food preparation and clean up: Secondary | ICD-10-CM

## 2014-04-11 DIAGNOSIS — S61209A Unspecified open wound of unspecified finger without damage to nail, initial encounter: Secondary | ICD-10-CM

## 2014-04-11 NOTE — Discharge Instructions (Signed)
Thank you for coming in today. Remove the sutures in 7-10 days. Come back as needed.   Laceration Care, Adult A laceration is a cut or lesion that goes through all layers of the skin and into the tissue just beneath the skin. TREATMENT  Some lacerations may not require closure. Some lacerations may not be able to be closed due to an increased risk of infection. It is important to see your caregiver as soon as possible after an injury to minimize the risk of infection and maximize the opportunity for successful closure. If closure is appropriate, pain medicines may be given, if needed. The wound will be cleaned to help prevent infection. Your caregiver will use stitches (sutures), staples, wound glue (adhesive), or skin adhesive strips to repair the laceration. These tools bring the skin edges together to allow for faster healing and a better cosmetic outcome. However, all wounds will heal with a scar. Once the wound has healed, scarring can be minimized by covering the wound with sunscreen during the day for 1 full year. HOME CARE INSTRUCTIONS  For sutures or staples:  Keep the wound clean and dry.  If you were given a bandage (dressing), you should change it at least once a day. Also, change the dressing if it becomes wet or dirty, or as directed by your caregiver.  Wash the wound with soap and water 2 times a day. Rinse the wound off with water to remove all soap. Pat the wound dry with a clean towel.  After cleaning, apply a thin layer of the antibiotic ointment as recommended by your caregiver. This will help prevent infection and keep the dressing from sticking.  You may shower as usual after the first 24 hours. Do not soak the wound in water until the sutures are removed.  Only take over-the-counter or prescription medicines for pain, discomfort, or fever as directed by your caregiver.  Get your sutures or staples removed as directed by your caregiver. For skin adhesive strips:  Keep  the wound clean and dry.  Do not get the skin adhesive strips wet. You may bathe carefully, using caution to keep the wound dry.  If the wound gets wet, pat it dry with a clean towel.  Skin adhesive strips will fall off on their own. You may trim the strips as the wound heals. Do not remove skin adhesive strips that are still stuck to the wound. They will fall off in time. For wound adhesive:  You may briefly wet your wound in the shower or bath. Do not soak or scrub the wound. Do not swim. Avoid periods of heavy perspiration until the skin adhesive has fallen off on its own. After showering or bathing, gently pat the wound dry with a clean towel.  Do not apply liquid medicine, cream medicine, or ointment medicine to your wound while the skin adhesive is in place. This may loosen the film before your wound is healed.  If a dressing is placed over the wound, be careful not to apply tape directly over the skin adhesive. This may cause the adhesive to be pulled off before the wound is healed.  Avoid prolonged exposure to sunlight or tanning lamps while the skin adhesive is in place. Exposure to ultraviolet light in the first year will darken the scar.  The skin adhesive will usually remain in place for 5 to 10 days, then naturally fall off the skin. Do not pick at the adhesive film. You may need a tetanus shot if:  You cannot remember when you had your last tetanus shot.  You have never had a tetanus shot. If you get a tetanus shot, your arm may swell, get red, and feel warm to the touch. This is common and not a problem. If you need a tetanus shot and you choose not to have one, there is a rare chance of getting tetanus. Sickness from tetanus can be serious. SEEK MEDICAL CARE IF:   You have redness, swelling, or increasing pain in the wound.  You see a red line that goes away from the wound.  You have yellowish-white fluid (pus) coming from the wound.  You have a fever.  You notice a  bad smell coming from the wound or dressing.  Your wound breaks open before or after sutures have been removed.  You notice something coming out of the wound such as wood or glass.  Your wound is on your hand or foot and you cannot move a finger or toe. SEEK IMMEDIATE MEDICAL CARE IF:   Your pain is not controlled with prescribed medicine.  You have severe swelling around the wound causing pain and numbness or a change in color in your arm, hand, leg, or foot.  Your wound splits open and starts bleeding.  You have worsening numbness, weakness, or loss of function of any joint around or beyond the wound.  You develop painful lumps near the wound or on the skin anywhere on your body. MAKE SURE YOU:   Understand these instructions.  Will watch your condition.  Will get help right away if you are not doing well or get worse. Document Released: 11/04/2005 Document Revised: 01/27/2012 Document Reviewed: 04/30/2011 [facility] Patient Information 2014 [place], [place].

## 2014-04-11 NOTE — ED Notes (Addendum)
Was cutting onions and cut L ring finger with a serrated knife @ 1730.  1/4 " laceration to tip of finger. Bleeding stopped.  Good sensation distally.

## 2014-04-11 NOTE — ED Provider Notes (Signed)
Sharon Harrell is a 63 y.o. female who presents to Urgent Care today for a finger laceration. Patient is here for laceration of her left fourth digit. Laceration of her this evening while cutting an onion. Her last tetanus destination was less than 10 years ago. She feels well otherwise. No fevers chills nausea vomiting or diarrhea.   Past Medical History  Diagnosis Date  . Diverticulitis   . Thyroid disease 12/2007  . SUI (stress urinary incontinence, female) 12/07  . Osteopenia 5/99  . Depression 1/06    resolved  . Vitamin D deficiency disease 11/07  . DDD (degenerative disc disease), cervical 2006   History  Substance Use Topics  . Smoking status: Former Smoker -- 0.25 packs/day for 8 years    Quit date: 11/19/1971  . Smokeless tobacco: Not on file  . Alcohol Use: Yes     Comment: occaisional   ROS as above Medications: No current facility-administered medications for this encounter.   Current Outpatient Prescriptions  Medication Sig Dispense Refill  . Cholecalciferol (VITAMIN D-3) 5000 UNITS TABS Take 1 tablet by mouth daily.      . fish oil-omega-3 fatty acids 1000 MG capsule Take 1 g by mouth daily.        Marland Kitchen levothyroxine (SYNTHROID, LEVOTHROID) 75 MCG tablet Take 75 mcg by mouth daily.        . Loratadine (CLARITIN) 10 MG CAPS Take 10 mg by mouth.      . Magnesium 100 MG CAPS Take 2.5 capsules by mouth daily.      . MOMETASONE FUROATE EX Apply topically daily as needed.      . Multiple Vitamin (MULTIVITAMIN) tablet Take 1 tablet by mouth daily.        Marland Kitchen Fexofenadine HCl (ALLEGRA PO) Take by mouth.          Exam:  BP 114/74  Pulse 69  Temp(Src) 98 F (36.7 C) (Oral)  Resp 12  SpO2 98% Gen: Well NAD Left fourth digit: Flap-type laceration extending through the dermis. Total length is about 1 cm. Occurs at the tip of the digit. Capillary refill pulses sensation and motion are intact.  Laceration repair: Consent obtained and timeout performed. Skin cleaned and a  digital block was injected using 4 mL of lidocaine without epinephrine.  The wound was copiously irrigated.  The skin was prepped and draped in the usual sterile fashion. One simple interrupted suture and one horizontal mattress suture using 5-0 Prolene was used to close the wound. Patient tolerated the procedure well.  No results found for this or any previous visit (from the past 24 hour(s)). No results found.  Assessment and Plan: 63 y.o. female with  laceration of the finger. Repaired with suturing. Followup as needed. Sutures out in 7-10 days.  Discussed warning signs or symptoms. Please see discharge instructions. Patient expresses understanding.    Gregor Hams, MD 04/11/14 [phone]

## 2014-04-19 ENCOUNTER — Encounter (HOSPITAL_COMMUNITY): Payer: Self-pay | Admitting: Emergency Medicine

## 2014-04-19 ENCOUNTER — Emergency Department (HOSPITAL_COMMUNITY)
Admission: EM | Admit: 2014-04-19 | Discharge: 2014-04-19 | Disposition: A | Discharge status: Home / Self Care | Payer: BC Managed Care – PPO | Source: Home / Self Care | Attending: Family Medicine | Admitting: Family Medicine

## 2014-04-19 DIAGNOSIS — Z4802 Encounter for removal of sutures: Secondary | ICD-10-CM

## 2014-04-19 NOTE — ED Notes (Signed)
Pt here for suture removal.  States "unsure if healed well because I keep hitting it"

## 2014-04-19 NOTE — Discharge Instructions (Signed)
Suture Removal, Care After Refer to this sheet in the next few weeks. These instructions provide you with information on caring for yourself after your procedure. Your health care provider may also give you more specific instructions. Your treatment has been planned according to current medical practices, but problems sometimes occur. Call your health care provider if you have any problems or questions after your procedure. WHAT TO EXPECT AFTER THE PROCEDURE After your stitches (sutures) are removed, it is typical to have the following:  Some discomfort and swelling in the wound area.  Slight redness in the area. HOME CARE INSTRUCTIONS   If you have skin adhesive strips over the wound area, do not take the strips off. They will fall off on their own in a few days. If the strips remain in place after 14 days, you may remove them.  Change any bandages (dressings) at least once a day or as directed by your health care provider. If the bandage sticks, soak it off with warm, soapy water.  Apply cream or ointment only as directed by your health care provider. If using cream or ointment, wash the area with soap and water 2 times a day to remove all the cream or ointment. Rinse off the soap and pat the area dry with a clean towel.  Keep the wound area dry and clean. If the bandage becomes wet or dirty, or if it develops a bad smell, change it as soon as possible.  Continue to protect the wound from injury.  Use sunscreen when out in the sun. New scars become sunburned easily. SEEK MEDICAL CARE IF:  You have increasing redness, swelling, or pain in the wound.  You see pus coming from the wound.  You have a fever.  You notice a bad smell coming from the wound or dressing.  Your wound breaks open (edges not staying together). Document Released: 07/30/2001 Document Revised: 08/25/2013 Document Reviewed: 06/16/2013 [employer] Patient Information 2014 [employer].  

## 2014-04-19 NOTE — ED Provider Notes (Signed)
Medical screening examination/treatment/procedure(s) were performed by resident physician or non-physician practitioner and as supervising physician I was immediately available for consultation/collaboration.   Brodric Schauer DOUGLAS MD.   Owen Pratte D Carrington Olazabal, MD 04/19/14 1557 

## 2014-04-19 NOTE — ED Provider Notes (Signed)
CSN: [id number]     Arrival date & time 04/19/14  0856 History   First MD Initiated Contact with Patient 04/19/14 1000     Chief Complaint  Patient presents with  . Suture / Staple Removal   (Consider location/radiation/quality/duration/timing/severity/associated sxs/prior Treatment) HPI Comments: Here for suture removal. Denies issues with wound since repair  Patient is a 63 y.o. female presenting with suture removal. The history is provided by the patient.  Suture / Staple Removal This is a new problem. Episode onset: suture placed at left ring finger on 04-11-2014.    Past Medical History  Diagnosis Date  . Diverticulitis   . Thyroid disease 12/2007  . SUI (stress urinary incontinence, female) 12/07  . Osteopenia 5/99  . Depression 1/06    resolved  . Vitamin D deficiency disease 11/07  . DDD (degenerative disc disease), cervical 2006   Past Surgical History  Procedure Laterality Date  . Hand surgery Left 10/2009    S-L   . Combined hysterectomy vaginal w/ mmk / a&p repair  1983  . Breast surgery    . Breast excisional biopsy Right 1991    benign  . Tonsillectomy  as child   Family History  Problem Relation Age of Onset  . Cancer Mother   . Osteoarthritis Mother   . Endometriosis Sister   . Stroke Maternal Grandmother   . Heart disease Brother    History  Substance Use Topics  . Smoking status: Former Smoker -- 0.25 packs/day for 8 years    Quit date: 11/19/1971  . Smokeless tobacco: Not on file  . Alcohol Use: Yes     Comment: occaisional   OB History   Grav Para Term Preterm Abortions TAB SAB Ect Mult Living   2 2 2             Review of Systems  All other systems reviewed and are negative.   Allergies  Review of patient's allergies indicates no known allergies.  Home Medications   Prior to Admission medications   Medication Sig Start Date End Date Taking? Authorizing Provider  Cholecalciferol (VITAMIN D-3) 5000 UNITS TABS Take 1 tablet by mouth  daily.   Yes Historical Provider, MD  Fexofenadine HCl (ALLEGRA PO) Take by mouth.     Yes Historical Provider, MD  fish oil-omega-3 fatty acids 1000 MG capsule Take 1 g by mouth daily.     Yes Historical Provider, MD  levothyroxine (SYNTHROID, LEVOTHROID) 75 MCG tablet Take 75 mcg by mouth daily.     Yes Historical Provider, MD  Loratadine (CLARITIN) 10 MG CAPS Take 10 mg by mouth.   Yes Historical Provider, MD  Magnesium 100 MG CAPS Take 2.5 capsules by mouth daily.   Yes Historical Provider, MD  Multiple Vitamin (MULTIVITAMIN) tablet Take 1 tablet by mouth daily.     Yes Historical Provider, MD  MOMETASONE FUROATE EX Apply topically daily as needed.    Historical Provider, MD   BP 104/70  Pulse 68  Temp(Src) 97.9 F (36.6 C) (Oral)  Resp 14  SpO2 100% Physical Exam  Nursing note and vitals reviewed. Constitutional: She is oriented to person, place, and time. She appears well-developed and well-nourished. No distress.  HENT:  Head: Normocephalic and atraumatic.  Cardiovascular: Normal rate.   Pulmonary/Chest: Effort normal.  Musculoskeletal: Normal range of motion.  Neurological: She is alert and oriented to person, place, and time.  Skin: Skin is warm and dry.  No signs of infection. Wound healing well with  prolene suture at tip of left ring finger  Psychiatric: She has a normal mood and affect. Her behavior is normal.    ED Course  Procedures (including critical care time) Labs Review Labs Reviewed - No data to display  Imaging Review No results found.   MDM   1. Encounter for removal of sutures   Suture removed ay CMA. Printed instructions provided to patient regarding continued wound care at home.     [place], [place] 04/19/14 1120

## 2014-06-02 ENCOUNTER — Encounter: Payer: Self-pay | Admitting: *Deleted

## 2014-06-06 ENCOUNTER — Ambulatory Visit (INDEPENDENT_AMBULATORY_CARE_PROVIDER_SITE_OTHER): Payer: BC Managed Care – PPO | Admitting: Neurology

## 2014-06-06 ENCOUNTER — Encounter: Payer: Self-pay | Admitting: Neurology

## 2014-06-06 VITALS — BP 108/71 | HR 82 | Wt 136.0 lb

## 2014-06-06 DIAGNOSIS — G4762 Sleep related leg cramps: Secondary | ICD-10-CM | POA: Insufficient documentation

## 2014-06-06 HISTORY — DX: Sleep related leg cramps: G47.62

## 2014-06-06 NOTE — Progress Notes (Signed)
Reason for visit: Leg cramps  Sharon Harrell is a 63 y.o. female  History of present illness:  Sharon Harrell is a 63 year old right-handed white female with a history of nocturnal leg cramps that began approximately 15 years ago. Over the last 7 years, cramps of become more prominent. Initially, the cramps would mainly affect the toes with flexion cramps. The patient has had worsening of the cramps that may affect either leg that now affect the calf muscles and sometimes the thighs. The patient cannot always get control of the cramps with stretching, and she will frequently have to get in the bathtub with hot water running on the legs. She may get up 5 times a night because of cramps, and about 50% of the nights she will have some sort of a cramp in the leg. She occasionally will have some intermittent tingling in the legs, but this is not persistent. She has been tried on ropinirole for restless leg syndrome without benefit. She has been seen by Dr. Estanislado Pandy and she has had extensive blood work testing done showing normal electrolytes and a normal CK enzyme level and thyroid profile. The patient indicates that her mother also has some cramps at night. She denies any weakness of the extremities, or difficulty controlling the bowels or the bladder with exception that there is some stress incontinence at times. She does have some slight right neck and shoulder discomfort, but no significant back pain or pain radiating down the arms or legs. She denies any balance issues. She is sent to this office for an evaluation. The patient will report occasional fasciculations of the muscle, but this is not a predominant symptom.  Past Medical History  Diagnosis Date  . Diverticulitis   . Thyroid disease 12/2007  . SUI (stress urinary incontinence, female) 12/07  . Osteopenia 5/99  . Depression 1/06    resolved  . Vitamin D deficiency disease 11/07  . DDD (degenerative disc disease), cervical 2006  . Nocturnal  leg cramps 06/06/2014    Past Surgical History  Procedure Laterality Date  . Hand surgery Left 10/2009    S-L   . Combined hysterectomy vaginal w/ mmk / a&p repair  1983  . Breast surgery    . Breast excisional biopsy Right 1991    benign  . Tonsillectomy  as child    Family History  Problem Relation Age of Onset  . Cancer Mother     cervical/breast  . Osteoarthritis Mother   . Endometriosis Sister   . Stroke Maternal Grandmother   . Heart disease Brother     Social history:  reports that she quit smoking about 42 years ago. She has never used smokeless tobacco. She reports that she drinks alcohol. She reports that she does not use illicit drugs.  Medications:  Current Outpatient Prescriptions on File Prior to Visit  Medication Sig Dispense Refill  . Cholecalciferol (VITAMIN D-3) 5000 UNITS TABS Take 1 tablet by mouth daily.      Marland Kitchen Fexofenadine HCl (ALLEGRA PO) Take by mouth.        . fish oil-omega-3 fatty acids 1000 MG capsule Take 1 g by mouth daily.        Marland Kitchen levothyroxine (SYNTHROID, LEVOTHROID) 75 MCG tablet Take 75 mcg by mouth daily.        . Loratadine (CLARITIN) 10 MG CAPS Take 10 mg by mouth.      . MOMETASONE FUROATE EX Apply topically daily as needed.      Marland Kitchen  Multiple Vitamin (MULTIVITAMIN) tablet Take 1 tablet by mouth daily.         No current facility-administered medications on file prior to visit.     No Known Allergies  ROS:  Out of a complete 14 system review of symptoms, the patient complains only of the following symptoms, and all other reviewed systems are negative.  Eye pain, burning and itching Constipation Feeling hot, cold, flushing, hot flashes Muscle cramps Seasonal allergies, runny nose Stress incontinence of the bladder Restless legs  Blood pressure 108/71, pulse 82, weight 136 lb (61.689 kg).  Physical Exam  General: The patient is alert and cooperative at the time of the examination.  Eyes: Pupils are equal, round, and reactive  to light. Discs are flat bilaterally.  Neck: The neck is supple, no carotid bruits are noted.  Respiratory: The respiratory examination is clear.  Cardiovascular: The cardiovascular examination reveals a regular rate and rhythm, no obvious murmurs or rubs are noted.  Neuromuscular: Range of movement of the cervical and lumbar spine is full.  Skin: Extremities are without significant edema.  Neurologic Exam  Mental status: The patient is alert and oriented x 3 at the time of the examination. The patient has apparent normal recent and remote memory, with an apparently normal attention span and concentration ability.  Cranial nerves: Facial symmetry is present. There is good sensation of the face to pinprick and soft touch bilaterally. The strength of the facial muscles and the muscles to head turning and shoulder shrug are normal bilaterally. Speech is well enunciated, no aphasia or dysarthria is noted. Extraocular movements are full. Visual fields are full. The tongue is midline, and the patient has symmetric elevation of the soft palate. No obvious hearing deficits are noted.  Motor: The motor testing reveals 5 over 5 strength of all 4 extremities. Good symmetric motor tone is noted throughout.  Sensory: Sensory testing is intact to pinprick, soft touch, vibration sensation, and position sense on all 4 extremities. No evidence of extinction is noted.  Coordination: Cerebellar testing reveals good finger-nose-finger and heel-to-shin bilaterally.  Gait and station: Gait is normal. Tandem gait is normal. Romberg is negative. No drift is seen.  Reflexes: Deep tendon reflexes are symmetric and normal bilaterally. Toes are downgoing bilaterally.   Assessment/Plan:  1. Nocturnal leg cramps  The patient has had a significant issue with nocturnal leg cramps over many years. The patient has gone on low-dose magnesium supplementation taking magnesium citrate 250 mg at night. I recommend  doubling this dose, and if this is not effective, she is to contact our office, and we will initiate baclofen therapy. In the future, quinine may need to be used for this issue. She will followup in 4 or 5 months. Clinical examination today is normal. EMG and nerve conduction study evaluation is likely to be of low yield.  Jill Alexanders MD 06/06/2014 7:16 PM  [facility] [address] [place], [place] [address]  Phone [phone] Fax [phone]

## 2014-06-06 NOTE — Patient Instructions (Signed)

## 2014-07-11 ENCOUNTER — Other Ambulatory Visit: Payer: Self-pay

## 2014-07-11 DIAGNOSIS — Z1231 Encounter for screening mammogram for malignant neoplasm of breast: Secondary | ICD-10-CM

## 2014-07-18 ENCOUNTER — Ambulatory Visit
Admission: RE | Admit: 2014-07-18 | Discharge: 2014-07-18 | Disposition: A | Discharge status: Home / Self Care | Payer: BC Managed Care – PPO | Source: Ambulatory Visit

## 2014-07-18 DIAGNOSIS — Z1231 Encounter for screening mammogram for malignant neoplasm of breast: Secondary | ICD-10-CM

## 2014-08-16 ENCOUNTER — Encounter: Payer: Self-pay | Admitting: Nurse Practitioner

## 2014-08-16 ENCOUNTER — Ambulatory Visit (INDEPENDENT_AMBULATORY_CARE_PROVIDER_SITE_OTHER): Payer: BC Managed Care – PPO | Admitting: Nurse Practitioner

## 2014-08-16 VITALS — BP 96/62 | HR 80 | Ht 66.5 in | Wt 131.0 lb

## 2014-08-16 DIAGNOSIS — Z1211 Encounter for screening for malignant neoplasm of colon: Secondary | ICD-10-CM

## 2014-08-16 DIAGNOSIS — Z01419 Encounter for gynecological examination (general) (routine) without abnormal findings: Secondary | ICD-10-CM

## 2014-08-16 MED ORDER — OMEPRAZOLE 40 MG PO CPDR: 40.0000 mg | DELAYED_RELEASE_CAPSULE | Freq: Every day | ORAL | Status: DC

## 2014-08-16 NOTE — Patient Instructions (Addendum)

## 2014-08-16 NOTE — Progress Notes (Signed)
Encounter reviewed by Dr. Tonny Isensee Silva.  

## 2014-08-16 NOTE — Progress Notes (Signed)
63 y.o. G48P2000 Married Caucasian Fe here for annual exam.  After 24 years of working she now has a new job change.  She is Engineer, building services for [employer]  During the time she was out of work her Mother was diagnosed with bilateral breast cancer and had mastectomy. We think she is BRCA negative.  She is doing very well. At age 63.  [name] last menstrual period was 08/18/1982.          Sexually active: no  The current method of family planning is abstinence.  Exercising: yes walking occasionally stair steps daily  Smoker: no   Health Maintenance:  Pap: Years ago, no abnormal pap before hysterectomy  MMG: 07/18/14, Bi-Rads1:  Negative   Colonoscopy: 04/2009, normal, repeat in 10 years  BMD: 06/23/13,  T Score: spine: -0.4; Left hip -1.8 - stable compared to  2009 and 2011 TDaP: 09/2006 Shingles:  04/2012  Labs:  Per Dr. Minna Antis    reports that she quit smoking about 42 years ago. She has never used smokeless tobacco. She reports that she drinks alcohol. She reports that she does not use illicit drugs.  Past Medical History  Diagnosis Date  . Diverticulitis   . Thyroid disease 12/2007  . SUI (stress urinary incontinence, female) 12/07  . Osteopenia 5/99  . Depression 1/06    resolved  . Vitamin D deficiency disease 11/07  . DDD (degenerative disc disease), cervical 2006  . Nocturnal leg cramps 06/06/2014    Past Surgical History  Procedure Laterality Date  . Hand surgery Left 10/2009    S-L   . Combined hysterectomy vaginal w/ mmk / a&p repair  1983  . Breast surgery    . Breast excisional biopsy Right 1991    benign  . Tonsillectomy  as child    Current Outpatient Prescriptions  Medication Sig Dispense Refill  . Cholecalciferol (VITAMIN D-3) 5000 UNITS TABS Take 1 tablet by mouth daily.      . [facility]S MAGNESIUM CITRATE PO Take 250 mg by mouth at bedtime.      Marland Kitchen Fexofenadine HCl (ALLEGRA PO) Take by mouth.        . fish oil-omega-3 fatty acids 1000 MG capsule Take 1 g  by mouth daily.        Marland Kitchen levothyroxine (SYNTHROID, LEVOTHROID) 75 MCG tablet Take 75 mcg by mouth daily.        . Loratadine (CLARITIN) 10 MG CAPS Take 10 mg by mouth.      . MOMETASONE FUROATE EX Apply topically daily as needed.      . Multiple Vitamin (MULTIVITAMIN) tablet Take 1 tablet by mouth daily.        Marland Kitchen omeprazole (PRILOSEC) 40 MG capsule Take 1 capsule (40 mg total) by mouth daily.  90 capsule  3  . Potassium Gluconate 595 MG CAPS Take 595 mg by mouth daily.      Marland Kitchen rOPINIRole (REQUIP) 0.5 MG tablet Take 0.5 mg by mouth daily as needed.       No current facility-administered medications for this visit.    Family History  Problem Relation Age of Onset  . Cancer Mother     cervical/breast  . Osteoarthritis Mother   . Endometriosis Sister   . Stroke Maternal Grandmother   . Heart disease Brother     ROS:  Pertinent items are noted in HPI.  Otherwise, a comprehensive ROS was negative.  Exam:   BP 96/62  Pulse 80  Ht 5' 6.5" (1.689  m)  Wt 131 lb (59.421 kg)  BMI 20.83 kg/m2  LMP 08/18/1982 Height: 5' 6.5" (168.9 cm)  Ht Readings from Last 3 Encounters:  08/16/14 5' 6.5" (1.689 m)  06/23/13 '5\' 6"'  (1.676 m)  10/28/11 '5\' 6"'  (1.676 m)    General appearance: alert, cooperative and appears stated age Head: Normocephalic, without obvious abnormality, atraumatic Neck: no adenopathy, supple, symmetrical, trachea midline and thyroid normal to inspection and palpation Lungs: clear to auscultation bilaterally Breasts: normal appearance, no masses or tenderness Heart: regular rate and rhythm Abdomen: soft, non-tender; no masses,  no organomegaly Extremities: extremities normal, atraumatic, no cyanosis or edema Skin: Skin color, texture, turgor normal. No rashes or lesions Lymph nodes: Cervical, supraclavicular, and axillary nodes normal. No abnormal inguinal nodes palpated Neurologic: Grossly normal   Pelvic: External genitalia:  no lesions              Urethra:  normal  appearing urethra with no masses, tenderness or lesions              Bartholin's and Skene's: normal                 Vagina: normal appearing vagina with normal color and discharge, no lesions              Cervix: absent              Pap taken: No. Bimanual Exam:  Uterus:  uterus absent              Adnexa: no mass, fullness, tenderness               Rectovaginal: Confirms               Anus:  normal sphincter tone, no lesions  A:  Well Woman with normal exam  S/P TVH with AP repair 1983, off ERT about 6+ months   Osteopenia with Vit D deficiency - followed by PCP  Hypothyroid   History of GERD  P:   Reviewed health and wellness pertinent to exam  Pap smear not taken today  Mammogram is due 8/16  Needs refill on Prilosec  Counseled on breast self exam, mammography screening, adequate intake of calcium and vitamin D, diet and exercise, Kegel's exercises return annually or prn  An After Visit Summary was printed and given to the patient.

## 2014-08-18 ENCOUNTER — Encounter: Payer: Self-pay | Admitting: Nurse Practitioner

## 2014-08-19 NOTE — Telephone Encounter (Signed)
Last AEX 08-16-14 with Sharon Harrell. Is this something you would prescribe?

## 2014-08-22 NOTE — Addendum Note (Signed)
Addended by: Antonietta Barcelona on: 08/22/2014 06:03 PM   Modules accepted: Orders

## 2014-08-29 ENCOUNTER — Telehealth: Payer: Self-pay | Admitting: Nurse Practitioner

## 2014-08-29 NOTE — Telephone Encounter (Signed)
I have reviewed the test results of her mother Sharon Harrell with the pathology of her breast cancer from 11/02/13.  The mother is positive estrogen, progesterone and HER2 NU negative.  There is no BRCA test results on this form.  Patient was notified and will try to get that information.

## 2014-08-31 LAB — FECAL OCCULT BLOOD, IMMUNOCHEMICAL: IFOBT: NEGATIVE

## 2014-08-31 NOTE — Addendum Note (Signed)
Addended by: Graylon Good on: 08/31/2014 08:53 AM   Modules accepted: Orders

## 2014-09-19 ENCOUNTER — Encounter: Payer: Self-pay | Admitting: Nurse Practitioner

## 2014-11-01 ENCOUNTER — Ambulatory Visit: Payer: BC Managed Care – PPO | Admitting: Neurology

## 2015-07-10 ENCOUNTER — Other Ambulatory Visit: Payer: Self-pay

## 2015-07-10 DIAGNOSIS — Z1231 Encounter for screening mammogram for malignant neoplasm of breast: Secondary | ICD-10-CM

## 2015-08-17 ENCOUNTER — Ambulatory Visit
Admission: RE | Admit: 2015-08-17 | Discharge: 2015-08-17 | Disposition: A | Discharge status: Home / Self Care | Payer: 59 | Source: Ambulatory Visit

## 2015-08-17 DIAGNOSIS — Z1231 Encounter for screening mammogram for malignant neoplasm of breast: Secondary | ICD-10-CM

## 2015-09-06 ENCOUNTER — Ambulatory Visit (INDEPENDENT_AMBULATORY_CARE_PROVIDER_SITE_OTHER): Payer: 59 | Admitting: Nurse Practitioner

## 2015-09-06 ENCOUNTER — Encounter: Payer: Self-pay | Admitting: Nurse Practitioner

## 2015-09-06 VITALS — BP 110/66 | HR 60 | Ht 66.25 in | Wt 134.0 lb

## 2015-09-06 DIAGNOSIS — Z1211 Encounter for screening for malignant neoplasm of colon: Secondary | ICD-10-CM

## 2015-09-06 DIAGNOSIS — M8580 Other specified disorders of bone density and structure, unspecified site: Secondary | ICD-10-CM | POA: Diagnosis not present

## 2015-09-06 DIAGNOSIS — Z01419 Encounter for gynecological examination (general) (routine) without abnormal findings: Secondary | ICD-10-CM

## 2015-09-06 DIAGNOSIS — Z0000 Encounter for general adult medical examination without abnormal findings: Secondary | ICD-10-CM

## 2015-09-06 DIAGNOSIS — R350 Frequency of micturition: Secondary | ICD-10-CM | POA: Diagnosis not present

## 2015-09-06 LAB — POCT URINALYSIS DIPSTICK
Bilirubin, UA: NEGATIVE
Glucose, UA: NEGATIVE
Ketones, UA: NEGATIVE
Leukocytes, UA: NEGATIVE
Nitrite, UA: NEGATIVE
PROTEIN UA: NEGATIVE
RBC UA: NEGATIVE
UROBILINOGEN UA: NEGATIVE
pH, UA: 6

## 2015-09-06 MED ORDER — FLUCONAZOLE 150 MG PO TABS: 150.0000 mg | ORAL_TABLET | Freq: Once | ORAL | Status: DC

## 2015-09-06 NOTE — Patient Instructions (Signed)

## 2015-09-06 NOTE — Progress Notes (Signed)
Patient ID: Sharon Harrell, female   DOB: [birth year], 64 y.o.   MRN: [id number] 64 y.o. G29P2002 Married  Caucasian Fe here for annual exam. She feels well.  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    [name] last menstrual period was 08/18/1982 (approximate).          Sexually active: No.  The current method of family planning is status post hysterectomy.    Exercising: Yes.    Home exercise routine includes walking about 2 miles at least 3 times per week. Smoker:  no  Health Maintenance: Pap: Years ago, no abnormal pap before hysterectomy  MMG: 08/17/15, Bi-Rads1: Negative  Colonoscopy: 04/2009, normal, repeat in 10 years  BMD: 06/23/13, T Score: spine: -0.4; Left hip -1.8 - stable compared to 2009 and 2011 TDaP: 09/2006 Shingles: 04/2012  Labs: PCP, Dr. Noah Delaine   reports that she quit smoking about 43 years ago. She has never used smokeless tobacco. She reports that she drinks alcohol. She reports that she does not use illicit drugs.  Past Medical History  Diagnosis Date  . Diverticulitis   . Thyroid disease 12/2007  . SUI (stress urinary incontinence, female) 12/07  . Osteopenia 5/99  . Depression 1/06    resolved  . Vitamin D deficiency disease 11/07  . DDD (degenerative disc disease), cervical 2006  . Nocturnal leg cramps 06/06/2014    Past Surgical History  Procedure Laterality Date  . Hand surgery Left 10/2009    S-L   . Combined hysterectomy vaginal w/ mmk / a&p repair  1983  . Breast surgery    . Breast excisional biopsy Right 1991    benign  . Tonsillectomy  as child    Current Outpatient Prescriptions  Medication Sig Dispense Refill  . Cholecalciferol (VITAMIN D-3) 5000 UNITS TABS Take 1 tablet by mouth daily.    . CVS MAGNESIUM CITRATE PO Take 250 mg by mouth at bedtime.    Marland Kitchen doxycycline (VIBRAMYCIN) 100 MG capsule Take 1 capsule by mouth 2 (two) times daily.  0  . fish oil-omega-3 fatty acids 1000 MG capsule Take 1 g by mouth daily.       Marland Kitchen levothyroxine (SYNTHROID, LEVOTHROID) 75 MCG tablet Take 75 mcg by mouth daily.      . Loratadine (CLARITIN) 10 MG CAPS Take 10 mg by mouth.    . MOMETASONE FUROATE EX Apply topically daily as needed.    . Multiple Vitamin (MULTIVITAMIN) tablet Take 1 tablet by mouth daily.      Marland Kitchen omeprazole (PRILOSEC) 40 MG capsule Take 1 capsule (40 mg total) by mouth daily. 90 capsule 3  . Potassium Gluconate 595 MG CAPS Take 595 mg by mouth daily.    Marland Kitchen rOPINIRole (REQUIP) 0.5 MG tablet Take 0.5 mg by mouth daily as needed.     No current facility-administered medications for this visit.    Family History  Problem Relation Age of Onset  . Cancer Mother     cervical/breast  . Osteoarthritis Mother   . Endometriosis Sister   . Stroke Maternal Grandmother   . Heart disease Brother     ROS:  Pertinent items are noted in HPI.  Otherwise, a comprehensive ROS was negative.  Exam:   BP 110/66 mmHg  Pulse 60  Ht 5' 6.25" (1.683 m)  Wt 134 lb (60.782 kg)  BMI 21.46 kg/m2  LMP 08/18/1982 (Approximate) Height: 5' 6.25" (168.3 cm) Ht Readings from Last 3 Encounters:  09/06/15 5' 6.25" (1.683 m)  08/16/14 5' 6.5" (1.689 m)  06/23/13 5\' 6"  (1.676 m)    General appearance: alert, cooperative and appears stated age Head: Normocephalic, without obvious abnormality, atraumatic Neck: no adenopathy, supple, symmetrical, trachea midline and thyroid normal to inspection and palpation Lungs: clear to auscultation bilaterally Breasts: normal appearance, no masses or tenderness Heart: regular rate and rhythm Abdomen: soft, non-tender; no masses,  no organomegaly Extremities: extremities normal, atraumatic, no cyanosis or edema Skin: Skin color, texture, turgor normal. No rashes or lesions Lymph nodes: Cervical, supraclavicular, and axillary nodes normal. No abnormal inguinal nodes palpated Neurologic: Grossly normal   Pelvic: External genitalia:  no lesions              Urethra:  normal appearing  urethra with no masses, tenderness or lesions              Bartholin's and Skene's: normal                 Vagina: normal appearing vagina with normal color and discharge, no lesions.  She has a grade I pelvic relaxation              Cervix: absent              Pap taken: No. Bimanual Exam:  Uterus:  uterus absent              Adnexa: no mass, fullness, tenderness               Rectovaginal: Confirms               Anus:  normal sphincter tone, no lesions  Chaperone present: no  A:  Well Woman with normal exam  S/P TVH with AP repair 1983, off ERT about 1 1/2 years  Osteopenia with Vit D deficiency - followed by PCP Hypothyroid  History of GERD   P:   Reviewed health and wellness pertinent to exam  Pap smear as above  Mammogram is due 07/2016  IFOB is given  Order is placed for BMD  Counseled on breast self exam, mammography screening, adequate intake of calcium and vitamin D, diet and exercise, Kegel's exercises return annually or prn  An After Visit Summary was printed and given to the patient.

## 2015-09-08 NOTE — Progress Notes (Signed)
Encounter reviewed by Dr. Brook Amundson C. Silva.  

## 2015-09-12 LAB — FECAL OCCULT BLOOD, IMMUNOCHEMICAL: IFOBT: NEGATIVE

## 2015-09-12 NOTE — Addendum Note (Signed)
Addended by: Susanne Greenhouse E on: 09/12/2015 09:26 AM   Modules accepted: Orders

## 2015-11-19 DIAGNOSIS — M810 Age-related osteoporosis without current pathological fracture: Secondary | ICD-10-CM

## 2015-11-19 HISTORY — DX: Age-related osteoporosis without current pathological fracture: M81.0

## 2016-01-01 ENCOUNTER — Ambulatory Visit (INDEPENDENT_AMBULATORY_CARE_PROVIDER_SITE_OTHER): Payer: 59 | Admitting: Obstetrics and Gynecology

## 2016-01-01 ENCOUNTER — Encounter: Payer: Self-pay | Admitting: Obstetrics and Gynecology

## 2016-01-01 VITALS — BP 90/60 | HR 86 | Temp 98.2°F | Resp 15 | Ht 66.5 in | Wt 136.0 lb

## 2016-01-01 DIAGNOSIS — N898 Other specified noninflammatory disorders of vagina: Secondary | ICD-10-CM

## 2016-01-01 DIAGNOSIS — R3915 Urgency of urination: Secondary | ICD-10-CM

## 2016-01-01 DIAGNOSIS — R350 Frequency of micturition: Secondary | ICD-10-CM

## 2016-01-01 DIAGNOSIS — N3289 Other specified disorders of bladder: Secondary | ICD-10-CM | POA: Diagnosis not present

## 2016-01-01 DIAGNOSIS — N9489 Other specified conditions associated with female genital organs and menstrual cycle: Secondary | ICD-10-CM

## 2016-01-01 DIAGNOSIS — R3989 Other symptoms and signs involving the genitourinary system: Secondary | ICD-10-CM

## 2016-01-01 DIAGNOSIS — N393 Stress incontinence (female) (male): Secondary | ICD-10-CM

## 2016-01-01 LAB — URINALYSIS, MICROSCOPIC ONLY
BACTERIA UA: NONE SEEN [HPF]
CRYSTALS: NONE SEEN [HPF]
Casts: NONE SEEN [LPF]
SQUAMOUS EPITHELIAL / LPF: NONE SEEN [HPF] (ref <=5)
Yeast: NONE SEEN [HPF]

## 2016-01-01 LAB — POCT URINALYSIS DIPSTICK
BILIRUBIN UA: NEGATIVE
Blood, UA: NEGATIVE
Glucose, UA: NEGATIVE
KETONES UA: NEGATIVE
Leukocytes, UA: NEGATIVE
Nitrite, UA: NEGATIVE
PH UA: 7.5
Protein, UA: NEGATIVE
Urobilinogen, UA: NEGATIVE

## 2016-01-01 NOTE — Progress Notes (Signed)
Patient ID: Sharon Harrell, female   DOB: 09-04-1951, 65 y.o.   MRN: [id number] GYNECOLOGY  VISIT   HPI: 65 y.o.   Married  Caucasian  female   G2P2002 with [name] last menstrual period was 08/18/1982 (approximate).   here c/o urinary frequency, urgency and pressure x 8 days. Her symptoms have been intermittent. 8 days ago she woke up in the middle of the night, was having trouble emptying. She doesn't feel she is emptying her bladder. She denies a vaginal bulge. She has pressure in the suprapubic region and in her vagina. She is voiding small amounts. No dysuria, no fevers. She feels a strange sensation when she voids. Prior to the last 8 days she felt she was emptying her bladder. She has a long h/o GSI, wears a mini-pad, leaks a small amount daily. She will rarely leak on the way to the bathroom She also c/o an intermittent, mild vaginal odor. Not sexually active for 10 years.   GYNECOLOGIC HISTORY: [name] last menstrual period was 08/18/1982 (approximate). Contraception:postmenopause Menopausal hormone therapy: none        OB History    Gravida Para Term Preterm AB TAB SAB Ectopic Multiple Living   2 2 2  0 0 0 0 0 0 2         Patient Active Problem List   Diagnosis Date Noted  . Nocturnal leg cramps 06/06/2014    Past Medical History  Diagnosis Date  . Diverticulitis   . Thyroid disease 12/2007  . SUI (stress urinary incontinence, female) 12/07  . Osteopenia 5/99  . Depression 1/06    resolved  . Vitamin D deficiency disease 11/07  . DDD (degenerative disc disease), cervical 2006  . Nocturnal leg cramps 06/06/2014    Past Surgical History  Procedure Laterality Date  . Hand surgery Left 10/2009    S-L   . Combined hysterectomy vaginal w/ mmk / a&p repair  1983  . Breast surgery    . Breast excisional biopsy Right 1991    benign  . Tonsillectomy  as child    Current Outpatient Prescriptions  Medication Sig Dispense Refill  . Cholecalciferol (VITAMIN D-3) 5000  UNITS TABS Take 1 tablet by mouth daily.    . CVS MAGNESIUM CITRATE PO Take 250 mg by mouth at bedtime.    . fish oil-omega-3 fatty acids 1000 MG capsule Take 1 g by mouth daily.      Marland Kitchen levothyroxine (SYNTHROID, LEVOTHROID) 75 MCG tablet Take 75 mcg by mouth daily.      . Loratadine (CLARITIN) 10 MG CAPS Take 10 mg by mouth.    . MOMETASONE FUROATE EX Apply topically daily as needed.    . Multiple Vitamin (MULTIVITAMIN) tablet Take 1 tablet by mouth daily.      Marland Kitchen omeprazole (PRILOSEC) 40 MG capsule Take 1 capsule (40 mg total) by mouth daily. 90 capsule 3  . Potassium Gluconate 595 MG CAPS Take 595 mg by mouth daily.     No current facility-administered medications for this visit.     ALLERGIES: Review of patient's allergies indicates no known allergies.  Family History  Problem Relation Age of Onset  . Cancer Mother     cervical/breast  . Osteoarthritis Mother   . Endometriosis Sister   . Stroke Maternal Grandmother   . Heart disease Brother     Social History   Social History  . Marital Status: Married    Spouse Name: N/A  . Number of Children: 2  .  Years of Education: college-1   Occupational History  .      [employer]   Social History Main Topics  . Smoking status: Former Smoker -- 0.25 packs/day for 8 years    Quit date: 11/19/1971  . Smokeless tobacco: Never Used  . Alcohol Use: Yes     Comment: occaisional  . Drug Use: No  . Sexual Activity: Yes    Birth Control/ Protection: Surgical   Other Topics Concern  . Not on file   Social History Narrative    Review of Systems  Constitutional: Negative.   HENT: Negative.   Eyes: Negative.   Respiratory: Negative.   Cardiovascular: Negative.   Gastrointestinal: Negative.   Genitourinary: Positive for urgency and frequency.       Urinary pressure- with burning at times  Musculoskeletal: Negative.   Skin: Negative.   Neurological: Negative.   Endo/Heme/Allergies: Negative.   Psychiatric/Behavioral:  Negative.     PHYSICAL EXAMINATION:    BP 90/60 mmHg  Pulse 86  Temp(Src) 98.2 F (36.8 C)  Resp 15  Ht 5' 6.5" (1.689 m)  Wt 136 lb (61.689 kg)  BMI 21.62 kg/m2  LMP 08/18/1982 (Approximate)    General appearance: alert, cooperative and appears stated age Abdomen: soft, non-tender; bowel sounds normal; no masses,  no organomegaly CVA: not tender  Pelvic: External genitalia:  no lesions              Urethra:  normal appearing urethra with no masses, tenderness or lesions              Bartholins and Skenes: normal                 Vagina: normal appearing vagina with normal color and discharge, no lesions. Atrophic. No prolapse.              Cervix: absent              Bimanual Exam:  Uterus:  uterus absent              Adnexa: no mass, fullness, tenderness                PVR: 50 cc. Patient was catheterized using sterile technique.   Chaperone was present for exam.  ASSESSMENT Urinary frequency, urgency and sensation that she isn't emptying her bladder. Negative urine dip, normal PVR Suspect overactive bladder Vaginal odor GSI, stable, mild    PLAN Negative urine dip Will send urine for ua, c&s PVR 50 cc Recommended she cut back on caffeine, watch ETOH use. Discussed that spicy foods can be irritating to the bladder. Discussed the option of physical therapy for GSI and OAB Wet prep probe   An After Visit Summary was printed and given to the patient.  25 minutes face to face time of which over 50% was spent in counseling.

## 2016-01-01 NOTE — Patient Instructions (Signed)
Overactive Bladder, Adult Overactive bladder is a group of urinary symptoms. With overactive bladder, you may suddenly feel the need to pass urine (urinate) right away. After feeling this sudden urge, you might also leak urine if you cannot get to the bathroom fast enough (urinary incontinence). These symptoms might interfere with your daily work or social activities. Overactive bladder symptoms may also wake you up at night. Overactive bladder affects the nerve signals between your bladder and your brain. Your bladder may get the signal to empty before it is full. Very sensitive muscles can also make your bladder squeeze too soon. CAUSES Many things can cause an overactive bladder. Possible causes include:  Urinary tract infection.  Infection of nearby tissues, such as the prostate.  Prostate enlargement.  Being pregnant with twins or more (multiples).  Surgery on the uterus or urethra.  Bladder stones, inflammation, or tumors.  Drinking too much caffeine or alcohol.  Certain medicines, especially those that you take to help your body get rid of extra fluid (diuretics) by increasing urine production.  Muscle or nerve weakness, especially from:  A spinal cord injury.  Stroke.  Multiple sclerosis.  Parkinson disease.  Diabetes. This can cause a high urine volume that fills the bladder so quickly that the normal urge to urinate is triggered very strongly.  Constipation. A buildup of too much stool can put pressure on your bladder. RISK FACTORS You may be at greater risk for overactive bladder if you:  Are an older adult.  Smoke.  Are going through menopause.  Have prostate problems.  Have a neurological disease, such as stroke, dementia, Parkinson disease, or multiple sclerosis (MS).  Eat or drink things that irritate the bladder. These include alcohol, spicy food, and caffeine.  Are overweight or obese. SIGNS AND SYMPTOMS  The signs and symptoms of an overactive  bladder include:  Sudden, strong urges to urinate.  Leaking urine.  Urinating eight or more times per day.  Waking up to urinate two or more times per night. DIAGNOSIS Your health care provider may suspect overactive bladder based on your symptoms. The health care provider will do a physical exam and take your medical history. Blood or urine tests may also be done. For example, you might need to have a bladder function test to check how well you can hold your urine. You might also need to see a health care provider who specializes in the urinary tract (urologist). TREATMENT Treatment for overactive bladder depends on the cause of your condition and whether it is mild or severe. Certain treatments can be done in your health care provider's office or clinic. You can also make lifestyle changes at home. Options include: Behavioral Treatments  Biofeedback. A specialist uses sensors to help you become aware of your body's signals.  Keeping a daily log of when you need to urinate and what happens after the urge. This may help you manage your condition.  Bladder training. This helps you learn to control the urge to urinate by following a schedule that directs you to urinate at regular intervals (timed voiding). At first, you might have to wait a few minutes after feeling the urge. In time, you should be able to schedule bathroom visits an hour or more apart.  Kegel exercises. These are exercises to strengthen the pelvic floor muscles, which support the bladder. Toning these muscles can help you control urination, even if your bladder muscles are overactive. A specialist will teach you how to do these exercises correctly. They   require daily practice.  Weight loss. If you are obese or overweight, losing weight might relieve your symptoms of overactive bladder. Talk to your health care provider about losing weight and whether there is a specific program or method that would work best for you.  Diet  change. This might help if constipation is making your overactive bladder worse. Your health care provider or a dietitian can explain ways to change what you eat to ease constipation. You might also need to consume less alcohol and caffeine or drink other fluids at different times of the day.  Stopping smoking.  Wearing pads to absorb leakage while you wait for other treatments to take effect. Physical Treatments  Electrical stimulation. Electrodes send gentle pulses of electricity to strengthen the nerves or muscles that help to control the bladder. Sometimes, the electrodes are placed outside of the body. In other cases, they might be placed inside the body (implanted). This treatment can take several months to have an effect.  Supportive devices. Women may need a plastic device that fits into the vagina and supports the bladder (pessary). Medicines Several medicines can help treat overactive bladder and are usually used along with other treatments. Some are injected into the muscles involved in urination. Others come in pill form. Your health care provider may prescribe:  Antispasmodics. These medicines block the signals that the nerves send to the bladder. This keeps the bladder from releasing urine at the wrong time.  Tricyclic antidepressants. These types of antidepressants also relax bladder muscles. Surgery  You may have a device implanted to help manage the nerve signals that indicate when you need to urinate.  You may have surgery to implant electrodes for electrical stimulation.  Sometimes, very severe cases of overactive bladder require surgery to change the shape of the bladder. HOME CARE INSTRUCTIONS   Take medicines only as directed by your health care provider.  Use any implants or a pessary as directed by your health care provider.  Make any diet or lifestyle changes that are recommended by your health care provider. These might include:  Drinking less fluid or  drinking at different times of the day. If you need to urinate often during the night, you may need to stop drinking fluids early in the evening.  Cutting down on caffeine or alcohol. Both can make an overactive bladder worse. Caffeine is found in coffee, tea, and sodas.  Doing Kegel exercises to strengthen muscles.  Losing weight if you need to.  Eating a healthy and balanced diet to prevent constipation.  Keep a journal or log to track how much and when you drink and also when you feel the need to urinate. This will help your health care provider to monitor your condition. SEEK MEDICAL CARE IF:  Your symptoms do not get better after treatment.  Your pain and discomfort are getting worse.  You have more frequent urges to urinate.  You have a fever. SEEK IMMEDIATE MEDICAL CARE IF: You are not able to control your bladder at all.   This information is not intended to replace advice given to you by your health care provider. Make sure you discuss any questions you have with your health care provider.   Document Released: 08/31/2009 Document Revised: 11/25/2014 Document Reviewed: 03/30/2014 Elsevier Interactive Patient Education 2016 [employer]  

## 2016-01-02 LAB — WET PREP BY MOLECULAR PROBE
Candida species: NEGATIVE
GARDNERELLA VAGINALIS: NEGATIVE
TRICHOMONAS VAG: NEGATIVE

## 2016-01-03 ENCOUNTER — Telehealth: Payer: Self-pay

## 2016-01-03 MED ORDER — SULFAMETHOXAZOLE-TRIMETHOPRIM 800-160 MG PO TABS: 1.0000 | ORAL_TABLET | Freq: Two times a day (BID) | ORAL | Status: DC

## 2016-01-03 NOTE — Telephone Encounter (Signed)
Spoke with patient. Advised of results as seen below from [place]. She is agreeable and verbalizes understanding. Rx for Bactrim DS 1 po bid x 3 days sent to [facility] off [address] per patient request.  Routing to provider for final review. Patient agreeable to disposition. Will close encounter.

## 2016-01-03 NOTE — Telephone Encounter (Signed)
Left message to call [place] Sloop at [phone]. 

## 2016-01-03 NOTE — Telephone Encounter (Signed)
-----   Message from Salvadore Dom, MD sent at 01/03/2016 10:09 AM EST ----- Please inform the patient that her urine did grow out E. Coli and send in a script for bactrim ds, 1 po BID x 3 days.

## 2016-01-03 NOTE — Telephone Encounter (Signed)
Left message to call Kaitlyn at [phone]. 

## 2016-01-03 NOTE — Telephone Encounter (Signed)
Return call to Kaityln. °

## 2016-01-04 LAB — URINE CULTURE

## 2016-02-06 ENCOUNTER — Encounter: Payer: Self-pay | Admitting: Obstetrics and Gynecology

## 2016-02-06 ENCOUNTER — Emergency Department (HOSPITAL_COMMUNITY)
Admission: EM | Admit: 2016-02-06 | Discharge: 2016-02-06 | Disposition: A | Discharge status: Home / Self Care | Payer: 59

## 2016-02-06 ENCOUNTER — Ambulatory Visit (INDEPENDENT_AMBULATORY_CARE_PROVIDER_SITE_OTHER): Payer: 59 | Admitting: Obstetrics and Gynecology

## 2016-02-06 VITALS — BP 100/60 | HR 72 | Resp 15 | Wt 137.0 lb

## 2016-02-06 DIAGNOSIS — N3090 Cystitis, unspecified without hematuria: Secondary | ICD-10-CM | POA: Diagnosis not present

## 2016-02-06 DIAGNOSIS — R300 Dysuria: Secondary | ICD-10-CM | POA: Diagnosis not present

## 2016-02-06 LAB — POCT URINALYSIS DIPSTICK
Bilirubin, UA: NEGATIVE
Blood, UA: NEGATIVE
GLUCOSE UA: NEGATIVE
KETONES UA: NEGATIVE
Nitrite, UA: NEGATIVE
PH UA: 6.5
Protein, UA: NEGATIVE
Urobilinogen, UA: NEGATIVE

## 2016-02-06 MED ORDER — NITROFURANTOIN MONOHYD MACRO 100 MG PO CAPS
100.0000 mg | ORAL_CAPSULE | Freq: Two times a day (BID) | ORAL | Status: DC
Start: 2016-02-06 — End: 2016-07-25

## 2016-02-06 MED ORDER — PHENAZOPYRIDINE HCL 200 MG PO TABS
200.0000 mg | ORAL_TABLET | Freq: Three times a day (TID) | ORAL | Status: DC
Start: 2016-02-06 — End: 2016-07-25

## 2016-02-06 NOTE — Patient Instructions (Signed)

## 2016-02-06 NOTE — Progress Notes (Signed)
Patient ID: Sharon Harrell, female   DOB: 09-10-1951, 65 y.o.   MRN: [id number] GYNECOLOGY  VISIT   HPI: 65 y.o.   Married  Caucasian  female   G2P2002 with [name] last menstrual period was 08/18/1982 (approximate).   here c/o dysuria, urinary frequency and urgency x 2days. Yesterday she was having trouble voiding, today it's better.  No fevers or flank pain.  She was treated approximately 5 weeks ago for a UTI. She had a PVR of 50 at that visit secondary to the sensation she wasn't emptying her bladder. No other UTI's in the last year. She is sexually active without penetration.  She is going to [place] to see her son. He is retired Nature conservation officer and is now Chief Executive Officer.   GYNECOLOGIC HISTORY: [name] last menstrual period was 08/18/1982 (approximate). Contraception:postmenopause Menopausal hormone therapy: none         OB History    Gravida Para Term Preterm AB TAB SAB Ectopic Multiple Living   2 2 2  0 0 0 0 0 0 2         Patient Active Problem List   Diagnosis Date Noted  . Nocturnal leg cramps 06/06/2014    Past Medical History  Diagnosis Date  . Diverticulitis   . Thyroid disease 12/2007  . SUI (stress urinary incontinence, female) 12/07  . Osteopenia 5/99  . Depression 1/06    resolved  . Vitamin D deficiency disease 11/07  . DDD (degenerative disc disease), cervical 2006  . Nocturnal leg cramps 06/06/2014    Past Surgical History  Procedure Laterality Date  . Hand surgery Left 10/2009    S-L   . Combined hysterectomy vaginal w/ mmk / a&p repair  1983  . Breast surgery    . Breast excisional biopsy Right 1991    benign  . Tonsillectomy  as child    Current Outpatient Prescriptions  Medication Sig Dispense Refill  . Cholecalciferol (VITAMIN D-3) 5000 UNITS TABS Take 1 tablet by mouth daily.    . CVS MAGNESIUM CITRATE PO Take 250 mg by mouth at bedtime.    . fish oil-omega-3 fatty acids 1000 MG capsule Take 1 g by mouth daily.      Marland Kitchen levothyroxine  (SYNTHROID, LEVOTHROID) 75 MCG tablet Take 75 mcg by mouth daily.      . Loratadine (CLARITIN) 10 MG CAPS Take 10 mg by mouth.    . MOMETASONE FUROATE EX Apply topically daily as needed.    . Multiple Vitamin (MULTIVITAMIN) tablet Take 1 tablet by mouth daily.      Marland Kitchen omeprazole (PRILOSEC) 40 MG capsule Take 1 capsule (40 mg total) by mouth daily. (Patient taking differently: Take 40 mg by mouth daily. Patient takes PRN) 90 capsule 3  . Potassium Gluconate 595 MG CAPS Take 595 mg by mouth daily.    . nitrofurantoin, macrocrystal-monohydrate, (MACROBID) 100 MG capsule Take 1 capsule (100 mg total) by mouth 2 (two) times daily. 14 capsule 0  . phenazopyridine (PYRIDIUM) 200 MG tablet Take 1 tablet (200 mg total) by mouth 3 (three) times daily with meals. 6 tablet 0   No current facility-administered medications for this visit.     ALLERGIES: Review of patient's allergies indicates no known allergies.  Family History  Problem Relation Age of Onset  . Cancer Mother     cervical/breast  . Osteoarthritis Mother   . Endometriosis Sister   . Stroke Maternal Grandmother   . Heart disease Brother     Social History  Social History  . Marital Status: Married    Spouse Name: N/A  . Number of Children: 2  . Years of Education: college-1   Occupational History  .      [employer]   Social History Main Topics  . Smoking status: Former Smoker -- 0.25 packs/day for 8 years    Quit date: 11/19/1971  . Smokeless tobacco: Never Used  . Alcohol Use: Yes     Comment: occaisional  . Drug Use: No  . Sexual Activity: Yes    Birth Control/ Protection: Surgical   Other Topics Concern  . Not on file   Social History Narrative    Review of Systems  Constitutional: Negative.   HENT: Negative.   Eyes: Negative.   Respiratory: Negative.   Cardiovascular: Negative.   Gastrointestinal: Positive for constipation.  Genitourinary: Positive for dysuria, urgency and frequency.   Musculoskeletal: Negative.   Skin: Negative.   Neurological: Negative.   Endo/Heme/Allergies: Negative.   Psychiatric/Behavioral: Negative.     PHYSICAL EXAMINATION:    BP 100/60 mmHg  Pulse 72  Resp 15  Wt 137 lb (62.143 kg)  LMP 08/18/1982 (Approximate)    General appearance: alert, cooperative and appears stated age Abdomen: soft, non-tender; non-distended; no masses,  no organomegaly CVA: not tender  ASSESSMENT Cystitis, this is her second episode in 5 weeks    PLAN Send urine for ua, c&s Treat with macrobid and pyridium Advised her void after any sexual activity   An After Visit Summary was printed and given to the patient.

## 2016-02-07 LAB — URINALYSIS, MICROSCOPIC ONLY
Bacteria, UA: NONE SEEN [HPF]
Casts: NONE SEEN [LPF]
Crystals: NONE SEEN [HPF]
RBC / HPF: NONE SEEN RBC/HPF (ref <=2)
SQUAMOUS EPITHELIAL / LPF: NONE SEEN [HPF] (ref <=5)
WBC, UA: NONE SEEN WBC/HPF (ref <=5)
YEAST: NONE SEEN [HPF]

## 2016-02-07 LAB — URINE CULTURE
COLONY COUNT: NO GROWTH
ORGANISM ID, BACTERIA: NO GROWTH

## 2016-02-08 ENCOUNTER — Telehealth: Payer: Self-pay | Admitting: *Deleted

## 2016-02-08 NOTE — Telephone Encounter (Signed)
-----   Message from Salvadore Dom, MD sent at 02/08/2016 10:45 AM EDT ----- Please inform the patient that her urine culture was negative. She should be able to stop the antibiotics. Please see how she is feeling?

## 2016-02-08 NOTE — Telephone Encounter (Signed)
[place] in regards to [place]

## 2016-02-09 NOTE — Telephone Encounter (Signed)
Left message to call Kaitlyn at [phone]. 

## 2016-02-09 NOTE — Telephone Encounter (Signed)
Spoke with patient. Advised of message as seen below from [place]. She is agreeable and verbalizes understanding. States she is feeling much better and her symptoms have resolved. Aware if her symptoms return she will need to be seen in office for further evaluation. She is agreeable.  Routing to provider for final review. Patient agreeable to disposition. Will close encounter.

## 2016-02-09 NOTE — Telephone Encounter (Signed)
Patient is returning a call to Rebecca. Margaretha Sheffield is out of the office and patient would like a call back from a nurse today. Patient is going out of town today and wants to know if she needs to pick up a prescription. Last seen 02/06/16.

## 2016-07-16 ENCOUNTER — Ambulatory Visit
Admission: RE | Admit: 2016-07-16 | Discharge: 2016-07-16 | Disposition: A | Discharge status: Home / Self Care | Payer: 59 | Source: Ambulatory Visit | Attending: Nurse Practitioner | Admitting: Nurse Practitioner

## 2016-07-16 DIAGNOSIS — M8580 Other specified disorders of bone density and structure, unspecified site: Secondary | ICD-10-CM

## 2016-07-17 ENCOUNTER — Telehealth: Payer: Self-pay

## 2016-07-17 NOTE — Telephone Encounter (Signed)
Patient notified of results. See lab 

## 2016-07-17 NOTE — Telephone Encounter (Signed)
Pt states she had labs done at [facility]. Office was called & asked to fax those to [place] here.

## 2016-07-17 NOTE — Telephone Encounter (Signed)
-----   Message from [name], [place] sent at 07/17/2016  9:07 AM EDT ----- Results via my chart:  Please call pt to help her make an apt for follow up.  Sharon Harrell,  The Bone Density test done on 07/16/16 shows a T Score at the Spine: -1.1; right hip neck -2.4; left hip neck -2.5. The lowest reading at the hip puts you in the Osteoporosis range.  In comparison to results from 06/23/2013 there is a loss at the spine -6.8% and left hip at -1.21%.  With your family medical history of:   parent with hip fracture, parent with Osteoporosis, your history of hysterectomy and now off hormones X 2 yrs, history of being on thyroid medications, and being Vit D deficient - this puts you at higher risk of bone loss.  You are doing good at your exercise and walking 3 times a week but I am afraid that may not be enough.  My advise is for you to consider an office visit with one of or MD's to discuss further options of treatment.  You do not want further loss.  You may also decide to discuss this with your PCP.  A nurse will call you and help with making an apt if needed. You still have to do walking 3-4 times a week, upper body weight bearing exercises, good calcium and Vit D support.

## 2016-07-17 NOTE — Telephone Encounter (Signed)
lmtcb

## 2016-07-18 ENCOUNTER — Other Ambulatory Visit: Payer: Self-pay | Admitting: Internal Medicine

## 2016-07-18 ENCOUNTER — Other Ambulatory Visit: Payer: Self-pay | Admitting: Nurse Practitioner

## 2016-07-18 DIAGNOSIS — Z1231 Encounter for screening mammogram for malignant neoplasm of breast: Secondary | ICD-10-CM

## 2016-07-25 ENCOUNTER — Encounter: Payer: Self-pay | Admitting: Obstetrics and Gynecology

## 2016-07-25 ENCOUNTER — Ambulatory Visit (INDEPENDENT_AMBULATORY_CARE_PROVIDER_SITE_OTHER): Payer: 59 | Admitting: Obstetrics and Gynecology

## 2016-07-25 VITALS — BP 102/60 | HR 64 | Resp 13 | Wt 132.0 lb

## 2016-07-25 DIAGNOSIS — M810 Age-related osteoporosis without current pathological fracture: Secondary | ICD-10-CM

## 2016-07-25 DIAGNOSIS — D72819 Decreased white blood cell count, unspecified: Secondary | ICD-10-CM | POA: Diagnosis not present

## 2016-07-25 MED ORDER — ALENDRONATE SODIUM 70 MG PO TABS
70.0000 mg | ORAL_TABLET | ORAL | 11 refills | Status: DC
Start: 2016-07-25 — End: 2016-09-18

## 2016-07-25 NOTE — Progress Notes (Signed)
GYNECOLOGY  VISIT   HPI: 65 y.o.   Married  Caucasian  female   G2P2002 with [name] last menstrual period was 08/18/1982 (approximate).   here to discuss DEXA scan results   She reports mild issues with reflux, not on medication. She eats small amounts. She has had H. Pylori in the past.  She is on 5,000 IU of vit d a day, calcium in her multivitamin and gets some calcium in her diet. She exercises at minimum 2-3 x a week.  She is a non-smoker, only occasional ETOH use.   GYNECOLOGIC HISTORY: [name] last menstrual period was 08/18/1982 (approximate). Contraception:postmenopause Menopausal hormone therapy: none         OB History    Gravida Para Term Preterm AB Living   2 2 2  0 0 2   SAB TAB Ectopic Multiple Live Births   0 0 0 0 2         Patient Active Problem List   Diagnosis Date Noted  . Nocturnal leg cramps 06/06/2014    Past Medical History:  Diagnosis Date  . DDD (degenerative disc disease), cervical 2006  . Depression 1/06   resolved  . Diverticulitis   . Nocturnal leg cramps 06/06/2014  . Osteopenia 5/99  . SUI (stress urinary incontinence, female) 12/07  . Thyroid disease 12/2007  . Vitamin D deficiency disease 11/07    Past Surgical History:  Procedure Laterality Date  . BREAST EXCISIONAL BIOPSY Right 1991   benign  . BREAST SURGERY    . COMBINED HYSTERECTOMY VAGINAL W/ MMK / A&P REPAIR  1983  . HAND SURGERY Left 10/2009   S-L   . TONSILLECTOMY  as child    Current Outpatient Prescriptions  Medication Sig Dispense Refill  . Cholecalciferol (VITAMIN D-3) 5000 UNITS TABS Take 1 tablet by mouth daily.    . CVS MAGNESIUM CITRATE PO Take 250 mg by mouth at bedtime.    . fish oil-omega-3 fatty acids 1000 MG capsule Take 1 g by mouth daily.      Marland Kitchen levothyroxine (SYNTHROID, LEVOTHROID) 75 MCG tablet Take 75 mcg by mouth daily.      . Loratadine (CLARITIN) 10 MG CAPS Take 10 mg by mouth.    . loteprednol (LOTEMAX) 0.5 % ophthalmic suspension Place 1  drop into both eyes 4 (four) times daily.    . metroNIDAZOLE (METROCREAM) 0.75 % cream Apply 1 application topically daily as needed.    . MOMETASONE FUROATE EX Apply topically daily as needed.    . Multiple Vitamin (MULTIVITAMIN) tablet Take 1 tablet by mouth daily.      . Potassium Gluconate 595 MG CAPS Take 595 mg by mouth daily.    Marland Kitchen Propylene Glycol (SYSTANE BALANCE OP) Apply to eye.     No current facility-administered medications for this visit.      ALLERGIES: Review of patient's allergies indicates no known allergies.  Family History  Problem Relation Age of Onset  . Cancer Mother     cervical/breast  . Osteoarthritis Mother   . Endometriosis Sister   . Stroke Maternal Grandmother   . Heart disease Brother     Social History   Social History  . Marital status: Married    Spouse name: N/A  . Number of children: 2  . Years of education: college-1   Occupational History  .  [employer]    [place]   Social History Main Topics  . Smoking status: Former Smoker    Packs/day:  0.25    Years: 8.00    Quit date: 11/19/1971  . Smokeless tobacco: Never Used  . Alcohol use Yes     Comment: occaisional  . Drug use: No  . Sexual activity: Yes    Birth control/ protection: Surgical   Other Topics Concern  . Not on file   Social History Narrative  . No narrative on file    Review of Systems  Constitutional: Negative.   HENT: Negative.   Eyes: Negative.   Respiratory: Negative.   Cardiovascular: Negative.   Gastrointestinal: Negative.   Genitourinary: Negative.   Musculoskeletal: Negative.   Skin: Negative.   Neurological: Negative.   Endo/Heme/Allergies: Negative.   Psychiatric/Behavioral: Negative.     PHYSICAL EXAMINATION:    BP 102/60 (BP Location: Right Arm, Patient Position: Sitting, Cuff Size: Normal)   Pulse 64   Resp 13   Wt 132 lb (59.9 kg)   LMP 08/18/1982 (Approximate)   BMI 20.99 kg/m     General appearance: alert,  cooperative and appears stated age  ASSESSMENT Osteoporosis Leukopenia H/O mild reflux, not on medication    PLAN Discussed calcium, vit d and exercise Discussed oral and IV biphosphonates and prolia, including risks and side effects Discussed Evista, but it doesn't build bone like the other medications we discussed Will try the fosamax and see if she tolerates it Discussed the prolia, I need to look into the prolia with her chronically low WBC (not neutropenic)    An After Visit Summary was printed and given to the patient.  25 minutes face to face time of which over 50% was spent in counseling.

## 2016-07-25 NOTE — Patient Instructions (Signed)
Osteoporosis  Osteoporosis is the thinning and loss of density in the bones. Osteoporosis makes the bones more brittle, fragile, and likely to break (fracture). Over time, osteoporosis can cause the bones to become so weak that they fracture after a simple fall. The bones most likely to fracture are the bones in the hip, wrist, and spine.  CAUSES   The exact cause is not known.  RISK FACTORS  Anyone can develop osteoporosis. You may be at greater risk if you have a family history of the condition or have poor nutrition. You may also have a higher risk if you are:   · Female.    · 50 years old or older.  · A smoker.  · Not physically active.    · White or Asian.  · Slender.  SIGNS AND SYMPTOMS   A fracture might be the first sign of the disease, especially if it results from a fall or injury that would not usually cause a bone to break. Other signs and symptoms include:   · Low back and neck pain.  · Stooped posture.  · Height loss.  DIAGNOSIS   To make a diagnosis, your health care provider may:  · Take a medical history.  · Perform a physical exam.  · Order tests, such as:    A bone mineral density test.    A dual-energy X-ray absorptiometry test.  TREATMENT   The goal of osteoporosis treatment is to strengthen your bones to reduce your risk of a fracture. Treatment may involve:  · Making lifestyle changes, such as:    Eating a diet rich in calcium.    Doing weight-bearing and muscle-strengthening exercises.    Stopping tobacco use.    Limiting alcohol intake.  · Taking medicine to slow the process of bone loss or to increase bone density.  · Monitoring your levels of calcium and vitamin D.  HOME CARE INSTRUCTIONS  · Include calcium and vitamin D in your diet. Calcium is important for bone health, and vitamin D helps the body absorb calcium.  · Perform weight-bearing and muscle-strengthening exercises as directed by your health care provider.  · Do not use any tobacco products, including cigarettes, chewing  tobacco, and electronic cigarettes. If you need help quitting, ask your health care provider.  · Limit your alcohol intake.  · Take medicines only as directed by your health care provider.  · Keep all follow-up visits as directed by your health care provider. This is important.  · Take precautions at home to lower your risk of falling, such as:    Keeping rooms well lit and clutter free.    Installing safety rails on stairs.    Using rubber mats in the bathroom and other areas that are often wet or slippery.  SEEK IMMEDIATE MEDICAL CARE IF:   You fall or injure yourself.      This information is not intended to replace advice given to you by your health care provider. Make sure you discuss any questions you have with your health care provider.     Document Released: 08/14/2005 Document Revised: 11/25/2014 Document Reviewed: 04/14/2014  Elsevier Interactive Patient Education ©2016 Elsevier Inc.

## 2016-08-19 ENCOUNTER — Ambulatory Visit
Admission: RE | Admit: 2016-08-19 | Discharge: 2016-08-19 | Disposition: A | Discharge status: Home / Self Care | Payer: 59 | Source: Ambulatory Visit | Attending: Internal Medicine | Admitting: Internal Medicine

## 2016-08-19 DIAGNOSIS — Z1231 Encounter for screening mammogram for malignant neoplasm of breast: Secondary | ICD-10-CM

## 2016-08-21 ENCOUNTER — Other Ambulatory Visit: Payer: Self-pay | Admitting: Internal Medicine

## 2016-08-21 DIAGNOSIS — R928 Other abnormal and inconclusive findings on diagnostic imaging of breast: Secondary | ICD-10-CM

## 2016-08-29 ENCOUNTER — Ambulatory Visit
Admission: RE | Admit: 2016-08-29 | Discharge: 2016-08-29 | Disposition: A | Discharge status: Home / Self Care | Payer: 59 | Source: Ambulatory Visit | Attending: Internal Medicine | Admitting: Internal Medicine

## 2016-08-29 DIAGNOSIS — R928 Other abnormal and inconclusive findings on diagnostic imaging of breast: Secondary | ICD-10-CM

## 2016-09-18 ENCOUNTER — Other Ambulatory Visit: Payer: Self-pay | Admitting: Nurse Practitioner

## 2016-09-18 ENCOUNTER — Encounter: Payer: Self-pay | Admitting: Nurse Practitioner

## 2016-09-18 ENCOUNTER — Ambulatory Visit (INDEPENDENT_AMBULATORY_CARE_PROVIDER_SITE_OTHER): Payer: 59 | Admitting: Nurse Practitioner

## 2016-09-18 VITALS — BP 100/62 | HR 80 | Ht 66.0 in | Wt 127.0 lb

## 2016-09-18 DIAGNOSIS — M810 Age-related osteoporosis without current pathological fracture: Secondary | ICD-10-CM | POA: Diagnosis not present

## 2016-09-18 DIAGNOSIS — Z1211 Encounter for screening for malignant neoplasm of colon: Secondary | ICD-10-CM | POA: Diagnosis not present

## 2016-09-18 DIAGNOSIS — Z0000 Encounter for general adult medical examination without abnormal findings: Secondary | ICD-10-CM

## 2016-09-18 DIAGNOSIS — Z01411 Encounter for gynecological examination (general) (routine) with abnormal findings: Secondary | ICD-10-CM | POA: Diagnosis not present

## 2016-09-18 DIAGNOSIS — N952 Postmenopausal atrophic vaginitis: Secondary | ICD-10-CM | POA: Diagnosis not present

## 2016-09-18 LAB — POCT URINALYSIS DIPSTICK
Bilirubin, UA: NEGATIVE
Glucose, UA: NEGATIVE
Ketones, UA: NEGATIVE
LEUKOCYTES UA: NEGATIVE
NITRITE UA: NEGATIVE
PH UA: 7
PROTEIN UA: NEGATIVE
RBC UA: NEGATIVE
UROBILINOGEN UA: NEGATIVE

## 2016-09-18 MED ORDER — ALENDRONATE SODIUM 70 MG PO TABS: 70.0000 mg | ORAL_TABLET | ORAL | 4 refills | Status: DC

## 2016-09-18 NOTE — Progress Notes (Signed)
Patient ID: Sharon Harrell, female   DOB: 01/10/1951, 65 y.o.   MRN: [id number]  65 y.o. G5P2002 Married  Caucasian Fe here for annual exam.  She feels well.  There has been some increase in vaginal dryness and at times itching.  She has not been SA for about 15 yrs.  She is using no vaginal lubrication and does not want vaginal estrogen due to mother's history of cancer.  She has been in to see Dr. Talbert Nan about her BMD and is now on Fosamax since 07/25/16.  Seems to be doing OK.  She is not a candidate for Prolia due to chronically low WBC.  She is having problems with bad cramps in her legs and toes.  This can happen every week several times and mostly at night.  Does not seem to be related to activities or walking.  [name] last menstrual period was 08/18/1982 (approximate).          Sexually active: No.  The current method of family planning is post menopausal status.    Exercising: Yes.    walking 3-5 times per week Smoker:  no  Health Maintenance: Pap: Years ago, no abnormal pap before hysterectomy  MMG: 08/19/16 3D with Diagnostic Left on 08/29/16, Bi-Rads 2: Benign Colonoscopy: 04/2009, normal, repeat in 10 years - we given IFOB BMD: 07/16/16, T Score, -1.1 Spine / -2.5 Left Femur Neck; 06/23/13, T Score: spine: -0.4; Left hip -1.8 - stable compared to 2009 and 2011 TDaP: 09/2006 Shingles: 04/2012 Pneumonia: Not indicated due to age Hep C and HIV: drawn today Labs: PCP takes care of screening labs  Urine: Negative    reports that she quit smoking about 44 years ago. She has a 2.00 pack-year smoking history. She has never used smokeless tobacco. She reports that she drinks alcohol. She reports that she does not use drugs.  Past Medical History:  Diagnosis Date  . DDD (degenerative disc disease), cervical 2006  . Depression 1/06   resolved  . Diverticulitis   . Nocturnal leg cramps 06/06/2014  . Osteopenia 5/99  . SUI (stress urinary incontinence, female) 12/07  . Thyroid disease  12/2007  . Vitamin D deficiency disease 11/07    Past Surgical History:  Procedure Laterality Date  . BREAST EXCISIONAL BIOPSY Right 1991   benign  . BREAST SURGERY    . COMBINED HYSTERECTOMY VAGINAL W/ MMK / A&P REPAIR  1983  . HAND SURGERY Left 10/2009   S-L   . TONSILLECTOMY  as child    Current Outpatient Prescriptions  Medication Sig Dispense Refill  . alendronate (FOSAMAX) 70 MG tablet Take 1 tablet (70 mg total) by mouth every 7 (seven) days. Take with a full glass of water on an empty stomach. 4 tablet 11  . Cholecalciferol (VITAMIN D-3) 5000 UNITS TABS Take 1 tablet by mouth daily.    . CVS MAGNESIUM CITRATE PO Take 250 mg by mouth at bedtime.    . fish oil-omega-3 fatty acids 1000 MG capsule Take 1 g by mouth daily.      Marland Kitchen levothyroxine (SYNTHROID, LEVOTHROID) 75 MCG tablet Take 75 mcg by mouth daily.      . Loratadine (CLARITIN) 10 MG CAPS Take 10 mg by mouth.    . loteprednol (LOTEMAX) 0.5 % ophthalmic suspension Place 1 drop into both eyes 4 (four) times daily.    . metroNIDAZOLE (METROCREAM) 0.75 % cream Apply 1 application topically daily as needed.    . MOMETASONE FUROATE EX Apply topically  daily as needed.    . Multiple Vitamin (MULTIVITAMIN) tablet Take 1 tablet by mouth daily.      . Potassium Gluconate 595 MG CAPS Take 595 mg by mouth daily.    Marland Kitchen Propylene Glycol (SYSTANE BALANCE OP) Apply to eye.     No current facility-administered medications for this visit.     Family History  Problem Relation Age of Onset  . Cancer Mother     cervical/breast  . Osteoarthritis Mother   . Endometriosis Sister   . Stroke Maternal Grandmother   . Heart disease Brother     ROS:  Pertinent items are noted in HPI.  Otherwise, a comprehensive ROS was negative.  Exam:   LMP 08/18/1982 (Approximate)    Ht Readings from Last 3 Encounters:  01/01/16 5' 6.5" (1.689 m)  09/06/15 5' 6.25" (1.683 m)  08/16/14 5' 6.5" (1.689 m)    General appearance: alert, cooperative and  appears stated age Head: Normocephalic, without obvious abnormality, atraumatic Neck: no adenopathy, supple, symmetrical, trachea midline and thyroid normal to inspection and palpation Lungs: clear to auscultation bilaterally Breasts: normal appearance, no masses or tenderness, FCB changes with dense breast bilaterally. Heart: regular rate and rhythm Abdomen: soft, non-tender; no masses,  no organomegaly Extremities: extremities normal, atraumatic, no cyanosis or edema Skin: Skin color, texture, turgor normal. No rashes or lesions Lymph nodes: Cervical, supraclavicular, and axillary nodes normal. No abnormal inguinal nodes palpated Neurologic: Grossly normal   Pelvic: External genitalia:  no lesions              Urethra:  normal appearing urethra with no masses, tenderness or lesions              Bartholin's and Skene's: normal                 Vagina: normal appearing vagina with normal color and discharge, no lesions              Cervix: absent              Pap taken: No. Bimanual Exam:  Uterus:  uterus absent              Adnexa: no mass, fullness, tenderness               Rectovaginal: Confirms               Anus:  normal sphincter tone, no lesions  Chaperone present: no  A:  Well Woman with normal exam     S/P TVH with AP repair 1983, off ERT about 2 1/2 years  Osteoporosis now on Fosamax since 07/25/16  Vit D deficiency - followed by PCP Hypothyroid  History of GERD  Atrophic vaginitis   P:   Reviewed health and wellness pertinent to exam  Pap smear as above  Mammogram is due 10/18  Advise OTC products for vaginal dryness  She may try Mustard prn for cramps in feet - will also check BMP today  IFOB is given today  Refill fosamax to mail order pharmacy for a year  Counseled on breast self exam, mammography screening, adequate intake of calcium and vitamin D, diet and exercise, Kegel's exercises return annually or prn  An After  Visit Summary was printed and given to the patient.

## 2016-09-18 NOTE — Patient Instructions (Signed)

## 2016-09-19 LAB — BASIC METABOLIC PANEL
BUN: 15 mg/dL (ref 7–25)
CALCIUM: 9.5 mg/dL (ref 8.6–10.4)
CO2: 26 mmol/L (ref 20–31)
Chloride: 105 mmol/L (ref 98–110)
Creat: 0.75 mg/dL (ref 0.50–0.99)
GLUCOSE: 60 mg/dL — AB (ref 65–99)
Potassium: 4.4 mmol/L (ref 3.5–5.3)
SODIUM: 141 mmol/L (ref 135–146)

## 2016-09-19 LAB — HEPATITIS C ANTIBODY: HCV AB: NEGATIVE

## 2016-09-19 LAB — HIV ANTIBODY (ROUTINE TESTING W REFLEX): HIV 1&2 Ab, 4th Generation: NONREACTIVE

## 2016-09-20 LAB — HEMOGLOBIN A1C
Hgb A1c MFr Bld: 5 % (ref <5.7)
Mean Plasma Glucose: 97 mg/dL

## 2016-09-22 NOTE — Progress Notes (Signed)
Encounter reviewed by Dr. Guillermo Difrancesco Amundson C. Silva.  

## 2016-09-23 ENCOUNTER — Telehealth: Payer: Self-pay | Admitting: *Deleted

## 2016-09-23 LAB — FECAL OCCULT BLOOD, IMMUNOCHEMICAL: IFOBT: NEGATIVE

## 2016-09-23 NOTE — Addendum Note (Signed)
Addended by: [name] A on: 09/23/2016 08:37 AM   Modules accepted: Orders

## 2016-09-23 NOTE — Telephone Encounter (Signed)
-----   Message from Regina Eck, CNM sent at 09/21/2016  7:27 AM EDT ----- Notify patient that her HIV and Hep C is negative Kidney profile is normal Glucose slightly low, but Hgb A1-C is normal, no concerns

## 2016-09-23 NOTE — Telephone Encounter (Signed)
I have attempted to contact this patient by phone with the following results: left message to return call to [place] at [phone] answering machine (home per [facility]). Advised call was regarding recent labs and to call at her convenience.  [phone] (Home) *Preferred*

## 2016-09-25 NOTE — Telephone Encounter (Signed)
I have attempted to contact this patient by phone with the following results: left message to return call to [place] at [phone] answering machine (home per [facility]). Advised call was regarding recent labs and to call at her convenience. [phone] (Home) *Preferred*

## 2016-09-30 ENCOUNTER — Telehealth: Payer: Self-pay | Admitting: Obstetrics and Gynecology

## 2016-09-30 MED ORDER — NITROFURANTOIN MONOHYD MACRO 100 MG PO CAPS: 100.0000 mg | ORAL_CAPSULE | Freq: Two times a day (BID) | ORAL | 0 refills | Status: DC

## 2016-09-30 MED ORDER — PHENAZOPYRIDINE HCL 200 MG PO TABS
ORAL_TABLET | ORAL | 0 refills | Status: DC
Start: 2016-09-30 — End: 2017-08-19

## 2016-09-30 NOTE — Telephone Encounter (Signed)
Returned call to the patient. She c/o urgency to void, pain with urination. Felt like she was getting symptoms at the end of last week, improved. Now this evening her symptoms are severe.  Will call in macrobid and pyridium, only calling in the pyridium for 24 hours, if not better she needs to be seen.

## 2016-09-30 NOTE — Telephone Encounter (Signed)
Patient left message on office voicemail during lunch returning my call. I have attempted to contact this patient by phone with the following results: left message to return call to [place] at [phone] on answering machine (work). Name verified in voicemail, advised I was returning her call at the number given in message.  [phone]

## 2016-10-02 NOTE — Telephone Encounter (Signed)
Pt notified in result note.  Closing encounter. 

## 2017-06-23 ENCOUNTER — Telehealth: Payer: Self-pay

## 2017-07-16 DIAGNOSIS — M810 Age-related osteoporosis without current pathological fracture: Secondary | ICD-10-CM | POA: Insufficient documentation

## 2017-07-16 DIAGNOSIS — J040 Acute laryngitis: Secondary | ICD-10-CM | POA: Insufficient documentation

## 2017-07-16 DIAGNOSIS — K219 Gastro-esophageal reflux disease without esophagitis: Secondary | ICD-10-CM | POA: Insufficient documentation

## 2017-08-19 ENCOUNTER — Encounter: Payer: Self-pay | Admitting: Neurology

## 2017-08-19 ENCOUNTER — Ambulatory Visit (INDEPENDENT_AMBULATORY_CARE_PROVIDER_SITE_OTHER): Payer: 59 | Admitting: Neurology

## 2017-08-19 ENCOUNTER — Other Ambulatory Visit: Payer: Self-pay | Admitting: Obstetrics and Gynecology

## 2017-08-19 VITALS — BP 98/68 | HR 64 | Ht 66.0 in | Wt 133.5 lb

## 2017-08-19 DIAGNOSIS — G4762 Sleep related leg cramps: Secondary | ICD-10-CM | POA: Diagnosis not present

## 2017-08-19 DIAGNOSIS — Z1231 Encounter for screening mammogram for malignant neoplasm of breast: Secondary | ICD-10-CM

## 2017-08-19 MED ORDER — BACLOFEN 10 MG PO TABS: 5.0000 mg | ORAL_TABLET | Freq: Every day | ORAL | 3 refills | Status: DC

## 2017-08-19 NOTE — Patient Instructions (Signed)
   We will start baclofen at night for the muscle cramps. Take 1/2 of a 10 mg tablet at night and call for any dose adjustments.

## 2017-08-19 NOTE — Progress Notes (Signed)
Reason for visit: Nocturnal leg cramps  Referring physician: Dr. Willeen Niece Sharon Harrell is a 67 y.o. female  History of present illness:  Sharon Harrell is a 66 year old right-handed white female with a history of nocturnal leg cramps that have been an increasing problem for her over the last 18 years. The patient was seen through this office greater than 3 years ago for the same issue. She had been on magnesium therapy at that time, she never returned for follow-up. The patient has cramps mainly in the feet, sometimes in the hands and sometimes in the calf muscles of the legs. The patient will have 3-5 nights a week where she has muscle cramps and she may have multiple episodes of cramping during the night. The patient has some difficulty sleeping because of this. The patient denies any muscle weakness, she denies any numbness or tingling sensations with exception of some slight numbness in the hands. The patient denies any atrophy of the muscle or fasciculations in the muscle. The patient has undergone recent blood work to include a chemistry profile and a magnesium level which were unremarkable. The patient does occasionally have cramps during the day, but most of her issues are at nighttime. She indicates that she drinks anywhere from 1-3 cups of strong coffee a day. She also eats chocolate on a regular basis. She indicates that her mother also has nocturnal leg cramps. She has not noted any changes in balance, or difficulty controlling the bowels or the bladder. She has noted that when she is on vacation she is more relaxed and the nocturnal leg cramps are less of a problem. She comes to this office for further evaluation.  Past Medical History:  Diagnosis Date  . DDD (degenerative disc disease), cervical 2006  . Depression 1/06   resolved  . Diverticulitis   . Nocturnal leg cramps 06/06/2014  . Osteopenia 5/99  . Osteoporosis 2017  . SUI (stress urinary incontinence, female) 12/07  .  Thyroid disease 12/2007  . Vitamin D deficiency disease 11/07    Past Surgical History:  Procedure Laterality Date  . BREAST EXCISIONAL BIOPSY Right 1991   benign  . BREAST SURGERY    . COMBINED HYSTERECTOMY VAGINAL W/ MMK / A&P REPAIR  1983  . HAND SURGERY Left 10/2009   S-L   . TONSILLECTOMY  as child    Family History  Problem Relation Age of Onset  . Cancer Mother        cervical/breast  . Osteoarthritis Mother   . Endometriosis Sister   . Heart disease Brother   . Stroke Maternal Grandmother     Social history:  reports that she quit smoking about 45 years ago. She has a 2.00 pack-year smoking history. She has never used smokeless tobacco. She reports that she drinks alcohol. She reports that she does not use drugs.  Medications:  Prior to Admission medications   Medication Sig Start Date End Date Taking? Authorizing Provider  alendronate (FOSAMAX) 70 MG tablet Take 1 tablet (70 mg total) by mouth every 7 (seven) days. Take with a full glass of water on an empty stomach. 09/18/16  Yes [name], FNP  Cholecalciferol (VITAMIN D-3) 5000 UNITS TABS Take 1 tablet by mouth daily.   Yes [provider]  [facility] MAGNESIUM CITRATE PO Take 250 mg by mouth 2 (two) times daily.    Yes [provider]  fish oil-omega-3 fatty acids 1000 MG capsule Take 1 g by mouth daily.  Yes [provider]  fluticasone (FLONASE) 50 MCG/ACT nasal spray Place 1 spray into both nostrils daily.   Yes [provider]  levothyroxine (SYNTHROID, LEVOTHROID) 75 MCG tablet Take 75 mcg by mouth daily.     Yes [provider]  Loratadine (CLARITIN) 10 MG CAPS Take 10 mg by mouth.   Yes [provider]  metroNIDAZOLE (METROCREAM) 0.75 % cream Apply 1 application topically daily as needed.   Yes [provider]  MOMETASONE FUROATE EX Apply topically daily as needed.   Yes [provider]  Multiple Vitamin (MULTIVITAMIN) tablet Take 1  tablet by mouth daily.     Yes [provider]  nitrofurantoin, macrocrystal-monohydrate, (MACROBID) 100 MG capsule Take 1 capsule (100 mg total) by mouth 2 (two) times daily. 09/30/16  Yes Salvadore Dom, MD  olopatadine (PATANOL) 0.1 % ophthalmic solution Place 1 drop into both eyes 2 (two) times daily. 09/16/16  Yes [provider]  omeprazole (PRILOSEC) 40 MG capsule Take 40 mg by mouth daily.   Yes [provider]  phenazopyridine (PYRIDIUM) 200 MG tablet 1 tab po tid for the next 24 hours. If symptoms persist you should be seen 09/30/16  Yes Salvadore Dom, MD  Potassium Gluconate 595 MG CAPS Take 595 mg by mouth daily.   Yes [provider]  Propylene Glycol (SYSTANE BALANCE OP) Apply to eye.   Yes [provider]  ranitidine (ZANTAC) 150 MG tablet Take 150 mg by mouth at bedtime.   Yes [provider]     No Known Allergies  ROS:  Out of a complete 14 system review of symptoms, the patient complains only of the following symptoms, and all other reviewed systems are negative.  Cough Exertional urinary incontinence Easy bruising Feeling hot, cold Cramps Allergies, runny nose Memory loss, numbness, weakness  Blood pressure 98/68, pulse 64, height 5\' 6"  (1.676 m), weight 133 lb 8 oz (60.6 kg), last menstrual period 08/18/1982.  Physical Exam  General: The patient is alert and cooperative at the time of the examination.  Eyes: Pupils are equal, round, and reactive to light. Discs are flat bilaterally.  Neck: The neck is supple, no carotid bruits are noted.  Respiratory: The respiratory examination is clear.  Cardiovascular: The cardiovascular examination reveals a regular rate and rhythm, no obvious murmurs or rubs are noted.  Skin: Extremities are without significant edema.  Neurologic Exam  Mental status: The patient is alert and oriented x 3 at the time of the examination. The patient has apparent normal  recent and remote memory, with an apparently normal attention span and concentration ability.  Cranial nerves: Facial symmetry is present. There is good sensation of the face to pinprick and soft touch bilaterally. The strength of the facial muscles and the muscles to head turning and shoulder shrug are normal bilaterally. Speech is well enunciated, no aphasia or dysarthria is noted. Extraocular movements are full. Visual fields are full. The tongue is midline, and the patient has symmetric elevation of the soft palate. No obvious hearing deficits are noted.  Motor: The motor testing reveals 5 over 5 strength of all 4 extremities. Good symmetric motor tone is noted throughout.  Sensory: Sensory testing is intact to pinprick, soft touch, vibration sensation, and position sense on all 4 extremities. No evidence of extinction is noted.  Coordination: Cerebellar testing reveals good finger-nose-finger and heel-to-shin bilaterally.  Gait and station: Gait is normal. Tandem gait is normal. Romberg is negative. No drift is  seen.  Reflexes: Deep tendon reflexes are symmetric and normal bilaterally, with exception that the ankle jerk reflexes are depressed. Toes are downgoing bilaterally.   Assessment/Plan:  1. Nocturnal leg cramps  The patient is having ongoing problems with nocturnal leg cramps, this has been an issue for about 18 years. The patient will be placed on low-dose baclofen at night, she will call for any dose adjustments. She will follow-up otherwise in 6 months. If the cramps cannot be controlled, we may consider EMG and nerve conduction study in the future.  Jill Alexanders MD 08/19/2017 8:32 AM  [facility] [address] [place], [place] [address]  Phone [phone] Fax [phone]

## 2017-08-28 ENCOUNTER — Ambulatory Visit: Payer: 59

## 2017-08-29 ENCOUNTER — Ambulatory Visit
Admission: RE | Admit: 2017-08-29 | Discharge: 2017-08-29 | Disposition: A | Discharge status: Home / Self Care | Payer: 59 | Source: Ambulatory Visit | Attending: Obstetrics and Gynecology | Admitting: Obstetrics and Gynecology

## 2017-08-29 DIAGNOSIS — Z1231 Encounter for screening mammogram for malignant neoplasm of breast: Secondary | ICD-10-CM

## 2017-09-25 ENCOUNTER — Encounter: Payer: Self-pay | Admitting: Obstetrics and Gynecology

## 2017-09-25 ENCOUNTER — Other Ambulatory Visit: Payer: Self-pay

## 2017-09-25 ENCOUNTER — Ambulatory Visit (INDEPENDENT_AMBULATORY_CARE_PROVIDER_SITE_OTHER): Payer: 59 | Admitting: Obstetrics and Gynecology

## 2017-09-25 ENCOUNTER — Other Ambulatory Visit: Payer: Self-pay | Admitting: Obstetrics and Gynecology

## 2017-09-25 VITALS — BP 100/62 | HR 64 | Resp 12 | Ht 66.0 in | Wt 132.0 lb

## 2017-09-25 DIAGNOSIS — Z803 Family history of malignant neoplasm of breast: Secondary | ICD-10-CM | POA: Diagnosis not present

## 2017-09-25 DIAGNOSIS — Z9189 Other specified personal risk factors, not elsewhere classified: Secondary | ICD-10-CM

## 2017-09-25 DIAGNOSIS — Z01419 Encounter for gynecological examination (general) (routine) without abnormal findings: Secondary | ICD-10-CM

## 2017-09-25 DIAGNOSIS — Z8739 Personal history of other diseases of the musculoskeletal system and connective tissue: Secondary | ICD-10-CM | POA: Diagnosis not present

## 2017-09-25 DIAGNOSIS — N644 Mastodynia: Secondary | ICD-10-CM

## 2017-09-25 NOTE — Patient Instructions (Signed)
EXERCISE AND DIET:  We recommended that you start or continue a regular exercise program for good health. Regular exercise means any activity that makes your heart beat faster and makes you sweat.  We recommend exercising at least 30 minutes per day at least 3 days a week, preferably 4 or 5.  We also recommend a diet low in fat and sugar.  Inactivity, poor dietary choices and obesity can cause diabetes, heart attack, stroke, and kidney damage, among others.    ALCOHOL AND SMOKING:  Women should limit their alcohol intake to no more than 7 drinks/beers/glasses of wine (combined, not each!) per week. Moderation of alcohol intake to this level decreases your risk of breast cancer and liver damage. And of course, no recreational drugs are part of a healthy lifestyle.  And absolutely no smoking or even second hand smoke. Most people know smoking can cause heart and lung diseases, but did you know it also contributes to weakening of your bones? Aging of your skin?  Yellowing of your teeth and nails?  CALCIUM AND VITAMIN D:  Adequate intake of calcium and Vitamin D are recommended.  The recommendations for exact amounts of these supplements seem to change often, but generally speaking 600 mg of calcium (either carbonate or citrate) and 800 units of Vitamin D per day seems prudent. Certain women may benefit from higher intake of Vitamin D.  If you are among these women, your doctor will have told you during your visit.    PAP SMEARS:  Pap smears, to check for cervical cancer or precancers,  have traditionally been done yearly, although recent scientific advances have shown that most women can have pap smears less often.  However, every woman still should have a physical exam from her gynecologist every year. It will include a breast check, inspection of the vulva and vagina to check for abnormal growths or skin changes, a visual exam of the cervix, and then an exam to evaluate the size and shape of the uterus and  ovaries.  And after 65 years of age, a rectal exam is indicated to check for rectal cancers. We will also provide age appropriate advice regarding health maintenance, like when you should have certain vaccines, screening for sexually transmitted diseases, bone density testing, colonoscopy, mammograms, etc.   MAMMOGRAMS:  All women over 40 years old should have a yearly mammogram. Many facilities now offer a "3D" mammogram, which may cost around $50 extra out of pocket. If possible,  we recommend you accept the option to have the 3D mammogram performed.  It both reduces the number of women who will be called back for extra views which then turn out to be normal, and it is better than the routine mammogram at detecting truly abnormal areas.    COLONOSCOPY:  Colonoscopy to screen for colon cancer is recommended for all women at age 50.  We know, you hate the idea of the prep.  We agree, BUT, having colon cancer and not knowing it is worse!!  Colon cancer so often starts as a polyp that can be seen and removed at colonscopy, which can quite literally save your life!  And if your first colonoscopy is normal and you have no family history of colon cancer, most women don't have to have it again for 10 years.  Once every ten years, you can do something that may end up saving your life, right?  We will be happy to help you get it scheduled when you are ready.    Be sure to check your insurance coverage so you understand how much it will cost.  It may be covered as a preventative service at no cost, but you should check your particular policy.      Breast Self-Awareness Breast self-awareness means being familiar with how your breasts look and feel. It involves checking your breasts regularly and reporting any changes to your health care provider. Practicing breast self-awareness is important. A change in your breasts can be a sign of a serious medical problem. Being familiar with how your breasts look and feel allows  you to find any problems early, when treatment is more likely to be successful. All women should practice breast self-awareness, including women who have had breast implants. How to do a breast self-exam One way to learn what is normal for your breasts and whether your breasts are changing is to do a breast self-exam. To do a breast self-exam: Look for Changes  1. Remove all the clothing above your waist. 2. Stand in front of a mirror in a room with good lighting. 3. Put your hands on your hips. 4. Push your hands firmly downward. 5. Compare your breasts in the mirror. Look for differences between them (asymmetry), such as: ? Differences in shape. ? Differences in size. ? Puckers, dips, and bumps in one breast and not the other. 6. Look at each breast for changes in your skin, such as: ? Redness. ? Scaly areas. 7. Look for changes in your nipples, such as: ? Discharge. ? Bleeding. ? Dimpling. ? Redness. ? A change in position. Feel for Changes  Carefully feel your breasts for lumps and changes. It is best to do this while lying on your back on the floor and again while sitting or standing in the shower or tub with soapy water on your skin. Feel each breast in the following way:  Place the arm on the side of the breast you are examining above your head.  Feel your breast with the other hand.  Start in the nipple area and make  inch (2 cm) overlapping circles to feel your breast. Use the pads of your three middle fingers to do this. Apply light pressure, then medium pressure, then firm pressure. The light pressure will allow you to feel the tissue closest to the skin. The medium pressure will allow you to feel the tissue that is a little deeper. The firm pressure will allow you to feel the tissue close to the ribs.  Continue the overlapping circles, moving downward over the breast until you feel your ribs below your breast.  Move one finger-width toward the center of the body.  Continue to use the  inch (2 cm) overlapping circles to feel your breast as you move slowly up toward your collarbone.  Continue the up and down exam using all three pressures until you reach your armpit.  Write Down What You Find  Write down what is normal for each breast and any changes that you find. Keep a written record with breast changes or normal findings for each breast. By writing this information down, you do not need to depend only on memory for size, tenderness, or location. Write down where you are in your menstrual cycle, if you are still menstruating. If you are having trouble noticing differences in your breasts, do not get discouraged. With time you will become more familiar with the variations in your breasts and more comfortable with the exam. How often should I examine my breasts? Examine   your breasts every month. If you are breastfeeding, the best time to examine your breasts is after a feeding or after using a breast pump. If you menstruate, the best time to examine your breasts is 5-7 days after your period is over. During your period, your breasts are lumpier, and it may be more difficult to notice changes. When should I see my health care provider? See your health care provider if you notice:  A change in shape or size of your breasts or nipples.  A change in the skin of your breast or nipples, such as a reddened or scaly area.  Unusual discharge from your nipples.  A lump or thick area that was not there before.  Pain in your breasts.  Anything that concerns you.  This information is not intended to replace advice given to you by your health care provider. Make sure you discuss any questions you have with your health care provider. Document Released: 11/04/2005 Document Revised: 04/11/2016 Document Reviewed: 09/24/2015 Elsevier Interactive Patient Education  2018 Elsevier Inc.  

## 2017-09-25 NOTE — Progress Notes (Signed)
Scheduled patient while in office for left breast diagnostic mammogram with ultrasound at the [facility] on 10/01/2017 at 8:40 am. Patient is agreeable to date and time. Placed in mammogram hold.

## 2017-09-25 NOTE — Progress Notes (Signed)
66 y.o. [id number] MarriedCaucasianF here for annual exam.   Husband became disabled last year. He was a hand physical therapist and ended up working for the person he sold his business to, caused depression and led to his disability.  Mother and Brother died this year. 3 y/o [place] daughter (Sons child) tried to commit suicide and is in inpatient therapy. She has some twinges of pain in her left breast, notices some skin changes on the left as well. The breast pain has been intermittent for the last few years.  Mom had bilateral breast cancers.      [name] last menstrual period was 08/18/1982 (approximate).          Sexually active: No.  The current method of family planning is status post hysterectomy.    Exercising: Yes.    walking Smoker: former smoker  Health Maintenance: Pap:  unsure History of abnormal Pap:  no MMG:  08-29-17 WNL  Colonoscopy:  2010 diverticulitis  BMD:   07-16-16 osteoporosis  TDaP: unsure Gardasil: N/A   reports that she quit smoking about 45 years ago. She has a 2.00 pack-year smoking history. she has never used smokeless tobacco. She reports that she drinks alcohol. She reports that she does not use drugs. 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 2 kids and 5 grand kids (6-15) all local.   Past Medical History:  Diagnosis Date  . DDD (degenerative disc disease), cervical 2006  . Depression 1/06   resolved  . Diverticulitis   . Nocturnal leg cramps 06/06/2014  . Osteoporosis 2017  . SUI (stress urinary incontinence, female) 12/07  . Thyroid disease 12/2007  . Vitamin D deficiency disease 11/07    Past Surgical History:  Procedure Laterality Date  . BREAST EXCISIONAL BIOPSY Right 1991   benign  . BREAST SURGERY    . COMBINED HYSTERECTOMY VAGINAL W/ MMK / A&P REPAIR  1983  . HAND SURGERY Left 10/2009   S-L   . TONSILLECTOMY  as child    Current Outpatient Medications  Medication Sig Dispense Refill  .  alendronate (FOSAMAX) 70 MG tablet Take 1 tablet (70 mg total) by mouth every 7 (seven) days. Take with a full glass of water on an empty stomach. 12 tablet 4  . baclofen (LIORESAL) 10 MG tablet Take 0.5 tablets (5 mg total) by mouth at bedtime. 15 each 3  . Cholecalciferol (VITAMIN D-3) 5000 UNITS TABS Take 1 tablet by mouth daily.    . [facility]S MAGNESIUM CITRATE PO Take 250 mg by mouth 2 (two) times daily.     . fish oil-omega-3 fatty acids 1000 MG capsule Take 1 g by mouth daily.      . fluticasone (FLONASE) 50 MCG/ACT nasal spray Place 1 spray into both nostrils daily.    Marland Kitchen levothyroxine (SYNTHROID, LEVOTHROID) 75 MCG tablet Take 75 mcg by mouth daily.      . metroNIDAZOLE (METROCREAM) 0.75 % cream Apply 1 application topically daily as needed.    . MOMETASONE FUROATE EX Apply topically daily as needed.    . Multiple Vitamin (MULTIVITAMIN) tablet Take 1 tablet by mouth daily.      Marland Kitchen olopatadine (PATANOL) 0.1 % ophthalmic solution Place 1 drop into both eyes 2 (two) times daily.    Marland Kitchen omeprazole (PRILOSEC) 40 MG capsule Take 40 mg by mouth daily.    . Potassium Gluconate 595 MG CAPS Take 595 mg by mouth daily.    Marland Kitchen  Propylene Glycol (SYSTANE BALANCE OP) Apply to eye.    . ranitidine (ZANTAC) 150 MG tablet Take 150 mg by mouth at bedtime.     No current facility-administered medications for this visit.     Family History  Problem Relation Age of Onset  . Cancer Mother        cervical/breast  . Osteoarthritis Mother   . Dementia Mother   . Endometriosis Sister   . Heart disease Brother   . Stroke Brother   . Stroke Maternal Grandmother   Mom with breast cancer bilaterally at 32  Review of Systems  Constitutional: Negative.   HENT: Negative.   Eyes: Negative.   Respiratory: Negative.   Cardiovascular: Negative.   Gastrointestinal: Negative.   Endocrine: Negative.   Genitourinary: Negative.   Musculoskeletal: Negative.   Skin: Negative.   Allergic/Immunologic: Negative.    Neurological: Negative.   Psychiatric/Behavioral: Negative.        Depression   The patient saw ENT for some issues with swallowing, was told she had reflux and was started on medication.   Exam:   BP 100/62 (BP Location: Right Arm, Patient Position: Sitting, Cuff Size: Normal)   Pulse 64   Resp 12   Ht 5\' 6"  (1.676 m)   Wt 132 lb (59.9 kg)   LMP 08/18/1982 (Approximate)   BMI 21.31 kg/m   Weight change: @WEIGHTCHANGE @ Height:   Height: 5\' 6"  (167.6 cm)  Ht Readings from Last 3 Encounters:  09/25/17 5\' 6"  (1.676 m)  08/19/17 5\' 6"  (1.676 m)  09/18/16 5\' 6"  (1.676 m)    General appearance: alert, cooperative and appears stated age Head: Normocephalic, without obvious abnormality, atraumatic Neck: no adenopathy, supple, symmetrical, trachea midline and thyroid normal to inspection and palpation Lungs: clear to auscultation bilaterally Cardiovascular: regular rate and rhythm Breasts: tender ridge of nodular tissue in the left upper outer quadrant (area of pain), evidence of breast biopsy on the right. No skin changes. The skin issue she was referring to are seborrhea keratosis.  Abdomen: soft, non-tender; non distended,  no masses,  no organomegaly Extremities: extremities normal, atraumatic, no cyanosis or edema Skin: Skin color, texture, turgor normal. No rashes or lesions Lymph nodes: Cervical, supraclavicular, and axillary nodes normal. No abnormal inguinal nodes palpated Neurologic: Grossly normal   Pelvic: External genitalia:  no lesions              Urethra:  normal appearing urethra with no masses, tenderness or lesions              Bartholins and Skenes: normal                 Vagina: normal appearing atrophic vagina with normal color and discharge, no lesions              Cervix: absent               Bimanual Exam:  Uterus:  uterus absent              Adnexa: no mass, fullness, tenderness               Rectovaginal: Confirms               Anus:  normal sphincter  tone, no lesions  Chaperone was present for exam.   A:  Well Woman with normal exam  Mom with a h/o bilateral breast cancer  Patient with dense breasts.   Mastalgia and tender nodular area in the upper  outer quadrant of the left breast  Osteoporosis, on fosamax. Will likely need to stop with her reflux.   P:   No pap needed  Labs and immunizations with her primary  Screening ammogram just done  Did the Tyrer-Cuzick risk assesment. Her lifetime risk of breast cancer is 20.8%   Discussed breast MRI  Will need diagnostic imaging on the left breast prior to MRI  Discussed breast self exam  Discussed calcium and vit D intake  DEXA in 8/17  Will get a copy of her labs from her primary, consider changing to prolia. She will discuss the fosamax with her ENT

## 2017-09-30 ENCOUNTER — Ambulatory Visit: Payer: 59 | Admitting: Nurse Practitioner

## 2017-10-01 ENCOUNTER — Ambulatory Visit
Admission: RE | Admit: 2017-10-01 | Discharge: 2017-10-01 | Disposition: A | Discharge status: Home / Self Care | Payer: 59 | Source: Ambulatory Visit | Attending: Obstetrics and Gynecology | Admitting: Obstetrics and Gynecology

## 2017-10-01 DIAGNOSIS — N644 Mastodynia: Secondary | ICD-10-CM

## 2017-10-06 ENCOUNTER — Telehealth: Payer: Self-pay

## 2017-10-06 DIAGNOSIS — Z9189 Other specified personal risk factors, not elsewhere classified: Secondary | ICD-10-CM

## 2017-10-06 NOTE — Telephone Encounter (Signed)
Left message to call [place] at [phone].  Removed from mammogram hold.

## 2017-10-06 NOTE — Telephone Encounter (Signed)
-----   Message from Salvadore Dom, MD sent at 10/01/2017  4:16 PM EST ----- You can take her off of mammogram hold. She needs to return for a 6 week breast check. She should do breast self exams.  She has just over a 20% risk of breast cancer. She should start doing yearly MRI's (please space out to 4/19).

## 2017-10-07 NOTE — Telephone Encounter (Signed)
Spoke with patient. Advised of message as seen below from [place]. 6 week recheck scheduled for 11/13/2017 at 1 pm with Dr.Jertson. Patient is agreeable. Order for Breast MRI placed for 6 months out. Patient states that she does not understand what she is supposed to be doing about finding out about taking Omeprazole and Zantac with Fosamax. Reports she does not understand why she would have been placed on these medications if they were contraindicated. Also states she had her lab results from PCP sent to [facility] for review to see if Prolia would be a good alternative for her. "I just need to know what I need to do." Declines to speak with ENT until clarification is given from [place].

## 2017-10-08 NOTE — Telephone Encounter (Signed)
Left message to return call -eh

## 2017-10-08 NOTE — Telephone Encounter (Signed)
Please let the patient know that I did get her labs from her primary, the only thing not done was a vit d. She should have a vit d level checked. If that is normal I think she would be a good candidate for the prolia. I will reach out to the ENT about her opinion about the Fosamax (email sent).  Please have her return for a vit d and ask Verline Lema to start working on prolia)

## 2017-10-13 ENCOUNTER — Other Ambulatory Visit: Payer: Self-pay

## 2017-10-13 NOTE — Telephone Encounter (Signed)
Patient returned call to [facility] and requested a call back on her mobile at: [phone]

## 2017-10-13 NOTE — Telephone Encounter (Signed)
Medication refill request: Alendronate  Last AEX:  09/25/17 JJ Next AEX: 11/05/18  Last MMG (if hormonal medication request): 10/01/17 MMG/US Left Breast - BIRADS 2 benign Refill authorized: 09/18/16 #12 w/4; today please advise

## 2017-10-13 NOTE — Telephone Encounter (Signed)
Spoke with patient. Advised of message as seen below from [place]. Patient states that her Vitamin D was included with her lab work. Was on the last page of labs. "It was in the 66s or 60s." Advised will review with Dr.Jertson and return call. Patient is agreeable. Does not wish to start process with Prolia until she hears from ENT.

## 2017-10-15 ENCOUNTER — Other Ambulatory Visit: Payer: Self-pay | Admitting: Podiatry

## 2017-10-17 MED ORDER — ALENDRONATE SODIUM 70 MG PO TABS
70.0000 mg | ORAL_TABLET | ORAL | 4 refills | Status: DC
Start: 2017-10-17 — End: 2017-10-30

## 2017-10-17 NOTE — Telephone Encounter (Signed)
Please let the patient know that I am still waiting to here back from ENT. I have sent her another message.  I do think that she should likely come off of the fosamax if it is causing reflux and necessitating her being on other medications. She has been advised about the option of Prolia. Let her know she would also have the option of IV biphosphonates (same category of medication as the Fosamax, but is given as a yearly injection).

## 2017-10-17 NOTE — Telephone Encounter (Signed)
[facility] called and stated that patient needs refill for Alendronate. Please advise, Dr. Talbert Nan out of office 10/17/17.

## 2017-10-17 NOTE — Telephone Encounter (Signed)
Spoke with patient. Advised of message as seen below from [place]. Patient verbalizes understanding. Would like to wait until Dr.Jertson has reviewed this with ENT before making a decision. States her reflux has not worsened since being on Fosamax. Is the same as it was before starting.

## 2017-10-20 ENCOUNTER — Telehealth: Payer: Self-pay | Admitting: Obstetrics and Gynecology

## 2017-10-20 NOTE — Telephone Encounter (Signed)
[facility] is asking for a verbal refill approval for alendronate.

## 2017-10-21 NOTE — Telephone Encounter (Signed)
RX was sent to [facility] on 10/17/17. Called patient to verify which pharmacy she wants. Patient states she wants to keep the RX at [place]

## 2017-10-21 NOTE — Telephone Encounter (Signed)
Spoke with patient. Patient states that she was contacted by [facility] to schedule her breast MRI. States she was advised this should be done in April. Wants to make sure she understands. Advised this will need to be done in 02/2018. Patient verbalizes understanding. Advised I will have a note sent to [facility] to schedule this for 02/2018. Patient is also asking about an update from ENT. Advised will review with Dr.Jertson and return call.

## 2017-10-24 NOTE — Telephone Encounter (Signed)
Please inform the patient that I did hear back from her ENT, she is fine with her being on Fosamax. Please refill for one year.

## 2017-10-24 NOTE — Telephone Encounter (Signed)
Left message to call -eh

## 2017-10-30 ENCOUNTER — Telehealth: Payer: Self-pay

## 2017-10-30 MED ORDER — ALENDRONATE SODIUM 70 MG PO TABS: 70.0000 mg | ORAL_TABLET | ORAL | 4 refills | Status: DC

## 2017-10-30 NOTE — Telephone Encounter (Signed)
Spoke with patient regarding Fosamax refill- Patient requested RX to be sent to mail in pharmacy this time- RX sent-eh

## 2017-10-30 NOTE — Telephone Encounter (Signed)
Spoke with patient. Patient states that she is returning a call to [place]. Advised will send a message to [facility] for return call. Patient is agreeable.

## 2017-11-04 NOTE — Telephone Encounter (Signed)
Dr.Jertson, okay to close encounter? Appears patient spoke with Margaretha Sheffield about a refill for Fosamax on 12/13.

## 2017-11-13 ENCOUNTER — Ambulatory Visit: Payer: 59 | Admitting: Obstetrics and Gynecology

## 2017-11-14 ENCOUNTER — Telehealth: Payer: Self-pay | Admitting: Obstetrics and Gynecology

## 2017-11-14 NOTE — Telephone Encounter (Signed)
Left patient a message to call and reschedule her appointment from 11/13/17, 6 week recheck due to power outage.

## 2017-12-01 ENCOUNTER — Ambulatory Visit: Payer: 59 | Admitting: Obstetrics and Gynecology

## 2017-12-01 ENCOUNTER — Other Ambulatory Visit: Payer: Self-pay

## 2017-12-01 ENCOUNTER — Encounter: Payer: Self-pay | Admitting: Obstetrics and Gynecology

## 2017-12-01 VITALS — BP 102/60 | HR 80 | Resp 16 | Wt 131.0 lb

## 2017-12-01 DIAGNOSIS — N644 Mastodynia: Secondary | ICD-10-CM | POA: Diagnosis not present

## 2017-12-01 DIAGNOSIS — Z9189 Other specified personal risk factors, not elsewhere classified: Secondary | ICD-10-CM | POA: Diagnosis not present

## 2017-12-01 NOTE — Progress Notes (Signed)
GYNECOLOGY  VISIT   HPI: 67 y.o.   Married  Caucasian  female   G2P2002 with [name] last menstrual period was 08/18/1982 (approximate).   here for 6 week breast recheck. At the time of her annual exam in 11/18 she had a tender ridge of nodular tissue in the upper outer quadrant of the left breast. Breast MRI was recommended for screening, she has an elevated risk of breast cancer over 20%.   GYNECOLOGIC HISTORY: [name] last menstrual period was 08/18/1982 (approximate). Contraception:Hysterectomy Menopausal hormone therapy: none        OB History    Gravida Para Term Preterm AB Living   2 2 2  0 0 2   SAB TAB Ectopic Multiple Live Births   0 0 0 0 2         Patient Active Problem List   Diagnosis Date Noted  . Nocturnal leg cramps 06/06/2014    Past Medical History:  Diagnosis Date  . DDD (degenerative disc disease), cervical 2006  . Depression 1/06   resolved  . Diverticulitis   . Nocturnal leg cramps 06/06/2014  . Osteoporosis 2017  . SUI (stress urinary incontinence, female) 12/07  . Thyroid disease 12/2007  . Vitamin D deficiency disease 11/07    Past Surgical History:  Procedure Laterality Date  . BREAST EXCISIONAL BIOPSY Right 1991   benign  . BREAST SURGERY    . COMBINED HYSTERECTOMY VAGINAL W/ MMK / A&P REPAIR  1983  . HAND SURGERY Left 10/2009   S-L   . TONSILLECTOMY  as child    Current Outpatient Medications  Medication Sig Dispense Refill  . alendronate (FOSAMAX) 70 MG tablet Take 1 tablet (70 mg total) by mouth every 7 (seven) days. Take with a full glass of water on an empty stomach. 12 tablet 4  . baclofen (LIORESAL) 10 MG tablet Take 0.5 tablets (5 mg total) by mouth at bedtime. 15 each 3  . Cholecalciferol (VITAMIN D-3) 5000 UNITS TABS Take 1 tablet by mouth daily.    . CVS MAGNESIUM CITRATE PO Take 250 mg by mouth 2 (two) times daily.     . fish oil-omega-3 fatty acids 1000 MG capsule Take 1 g by mouth daily.      . fluticasone (FLONASE) 50  MCG/ACT nasal spray Place 1 spray into both nostrils daily.    Marland Kitchen levothyroxine (SYNTHROID, LEVOTHROID) 75 MCG tablet Take 75 mcg by mouth daily.      . metroNIDAZOLE (METROCREAM) 0.75 % cream Apply 1 application topically daily as needed.    . MOMETASONE FUROATE EX Apply topically daily as needed.    . Multiple Vitamin (MULTIVITAMIN) tablet Take 1 tablet by mouth daily.      Marland Kitchen olopatadine (PATANOL) 0.1 % ophthalmic solution Place 1 drop into both eyes 2 (two) times daily.    Marland Kitchen omeprazole (PRILOSEC) 40 MG capsule Take 40 mg by mouth daily.    . Potassium Gluconate 595 MG CAPS Take 595 mg by mouth daily.    Marland Kitchen Propylene Glycol (SYSTANE BALANCE OP) Apply to eye.    . ranitidine (ZANTAC) 150 MG tablet Take 150 mg by mouth at bedtime.     No current facility-administered medications for this visit.      ALLERGIES: Patient has no known allergies.  Family History  Problem Relation Age of Onset  . Cancer Mother        cervical/breast  . Osteoarthritis Mother   . Dementia Mother   . Breast cancer Mother 68  .  Endometriosis Sister   . Heart disease Brother   . Stroke Brother   . Stroke Maternal Grandmother     Social History   Socioeconomic History  . Marital status: Married    Spouse name: Not on file  . Number of children: 2  . Years of education: college-1  . Highest education level: Not on file  Social Needs  . Financial resource strain: Not on file  . Food insecurity - worry: Not on file  . Food insecurity - inability: Not on file  . Transportation needs - medical: Not on file  . Transportation needs - non-medical: Not on file  Occupational History    Employer: [employer].    Comment: [place]  Tobacco Use  . Smoking status: Former Smoker    Packs/day: 0.25    Years: 8.00    Pack years: 2.00    Last attempt to quit: 11/19/1971    Years since quitting: 46.0  . Smokeless tobacco: Never Used  Substance and Sexual Activity  . Alcohol use: Yes    Comment:  occaisional  . Drug use: No  . Sexual activity: Not Currently    Partners: Male    Birth control/protection: Surgical  Other Topics Concern  . Not on file  Social History Narrative  . Not on file    Review of Systems  Constitutional: Negative.   HENT: Negative.   Eyes: Negative.   Respiratory: Negative.   Cardiovascular: Negative.   Gastrointestinal: Negative.   Genitourinary: Negative.   Musculoskeletal: Negative.   Skin: Negative.   Neurological: Negative.   Endo/Heme/Allergies: Negative.   Psychiatric/Behavioral: Negative.     PHYSICAL EXAMINATION:    BP 102/60 (BP Location: Right Arm, Patient Position: Sitting, Cuff Size: Normal)   Pulse 80   Resp 16   Wt 131 lb (59.4 kg)   LMP 08/18/1982 (Approximate)   BMI 21.14 kg/m     General appearance: alert, cooperative and appears stated age Breasts: less nodularity noted today in the upper outer quadrant of the left breast, still with mild tenderness. Normal right breast exam, evidence of prior breast biopsy.   No supraclavicular or axillary adenopathy.  ASSESSMENT Right breast tenderness and nodularity, improvement in nodularity on exam today, still tender Denies baseline pain Elevated risk of breast cancer    PLAN Recommended monthly breast self exam Discussed caffeine as a possible source of tenderness Plan breast MRI in April    An After Visit Summary was printed and given to the patient.

## 2017-12-01 NOTE — Patient Instructions (Signed)

## 2017-12-08 ENCOUNTER — Telehealth: Payer: Self-pay

## 2017-12-08 NOTE — Telephone Encounter (Signed)
Left message to call [place] at [phone].  Need to advise patient I have spoken with [facility] who will be reaching out to her to get her scheduled.

## 2017-12-08 NOTE — Telephone Encounter (Signed)
-----   Message from Salvadore Dom, MD sent at 12/01/2017  1:02 PM EST ----- There is an order in the system for a breast MRI. She plans to do this in April. She will have medicare then. Can we schedule this for her (early morning)? Thanks, Sharee Pimple

## 2017-12-09 ENCOUNTER — Other Ambulatory Visit: Payer: Self-pay | Admitting: Obstetrics and Gynecology

## 2017-12-09 MED ORDER — LEVOTHYROXINE SODIUM 75 MCG PO TABS: 75.0000 ug | ORAL_TABLET | Freq: Every day | ORAL | 1 refills | Status: DC

## 2017-12-09 NOTE — Telephone Encounter (Signed)
Spoke with patient. Advised [facility] will be contacting her regarding scheduling her bilateral breast MRI. Patient verbalizes understanding. Patient states that Dr.Mackenzie has been prescribing her Synthroid 75 mcg daily, but has been out on leave and is not going to return. Patient has a little over a week left of the medication. Asking if Dr.Jertson will fill this rx for her. Advised will review with Dr.Jertson and return call.

## 2017-12-09 NOTE — Telephone Encounter (Signed)
Please let the patient know that I will call in one month with one refill of her Synthroid. I can not find a TSH in her chart. Was Dr Noah Delaine an Endocrinologist? Dr Shelia Media is listed as her primary. She should f/u with her primary for blood work and further management.

## 2017-12-10 NOTE — Telephone Encounter (Signed)
Left message to call Maribel Hadley at [phone]. 

## 2017-12-16 NOTE — Telephone Encounter (Signed)
Left detailed message at number provided [phone], okay per ROI. Advised rx for Synthroid 1 month supply has been sent to pharmacy on file. Advised our office will not be able to fill this medication after 1 month. Advised will need to have this filled with PCP. Advised to return call with any further questions. Encounter closed.

## 2018-02-16 ENCOUNTER — Ambulatory Visit
Admission: RE | Admit: 2018-02-16 | Discharge: 2018-02-16 | Disposition: A | Discharge status: Home / Self Care | Payer: Medicare Other | Source: Ambulatory Visit | Attending: Obstetrics and Gynecology | Admitting: Obstetrics and Gynecology

## 2018-02-16 DIAGNOSIS — Z9189 Other specified personal risk factors, not elsewhere classified: Secondary | ICD-10-CM

## 2018-02-16 DIAGNOSIS — Z803 Family history of malignant neoplasm of breast: Secondary | ICD-10-CM | POA: Diagnosis not present

## 2018-02-16 MED ORDER — GADOBENATE DIMEGLUMINE 529 MG/ML IV SOLN
13.0000 mL | Freq: Once | INTRAVENOUS | Status: AC | PRN
  Administered 2018-02-16: 13 mL via INTRAVENOUS

## 2018-02-17 ENCOUNTER — Telehealth: Payer: Self-pay | Admitting: Obstetrics and Gynecology

## 2018-02-17 NOTE — Telephone Encounter (Signed)
Spoke with patient. Patient inquiring about "hepatic cyst" found during breast MRI, no further recommendations provided.   Advised patient can send copy of result to PCP for further review and recommendations. Patient states she is scheduled for new patient appointment with Dr. Juleen China on 02/20/18, will discuss further at that [place]. Patient request copy of 02/16/18 Breast MRI results be sent to Dr. Juleen China.   Results faxed via epic to Dr. Juleen China.   Patient thankful for return call and verbalizes understanding.   Routing to provider for final review. Patient is agreeable to disposition. Will close encounter.

## 2018-02-17 NOTE — Telephone Encounter (Signed)
Patient would like to speak with nurse about recent mri results and a cyst that was found.

## 2018-02-19 ENCOUNTER — Ambulatory Visit: Payer: 59 | Admitting: Neurology

## 2018-02-19 NOTE — Progress Notes (Signed)
Sharon Harrell is a 67 y.o. female is here for follow up.  History of Present Illness:   Sharon Harrell CMA acting as scribe for Dr. Juleen China.  HPI: Pat[place] comes in today to Establish Care with Dr. Juleen China. She is interested in a holistic approach to her care. She works at [employer] part time, previously the full time Glass blower/designer.  Medications: Patient would like to discuss her medications today and the different side effects. She is concerned about taking the Fosamax and Omeprazole.   Thyroid: Patient takes levothyroxine for her thyroid for the last 15 years. She is on the 75 mcg, no changes since starting.   Leg cramps: Patient has leg cramps that causes bruising at times. Previous work-ups negative. She is hesitant to take any medications for this issue. She would be okay with a supplement.   Health Maintenance Due  Topic Date Due  . TETANUS/TDAP  03/31/1971  . PNA vac Low Risk Adult (1 of 2 - PCV13) 03/30/2017   No flowsheet data found.   PMHx, SurgHx, SocialHx, FamHx, Medications, and Allergies were reviewed in the Visit Navigator and updated as appropriate.   Patient Active Problem List   Diagnosis Date Noted  . Allergic rhinitis 02/21/2018  . Family history of breast cancer 02/21/2018  . High serum high density lipoprotein (HDL) 02/21/2018  . Hepatic cyst 02/21/2018  . Laryngitis from reflux of stomach acid 07/16/2017  . Osteoporosis 07/16/2017  . Nocturnal leg cramps 06/06/2014   Social History   Tobacco Use  . Smoking status: Former Smoker    Packs/day: 0.25    Years: 8.00    Pack years: 2.00    Last attempt to quit: 11/19/1971    Years since quitting: 46.2  . Smokeless tobacco: Never Used  Substance Use Topics  . Alcohol use: Yes    Comment: occaisional  . Drug use: No   Current Medications and Allergies:   .  alendronate (FOSAMAX) 70 MG tablet, Take 1 tablet (70 mg total) by mouth every 7 (seven) days. Take with a full glass of water on an empty  stomach., Disp: 12 tablet, Rfl: 4 .  Cholecalciferol (VITAMIN D-3) 5000 UNITS TABS, Take 1 tablet by mouth daily., Disp: , Rfl:  .  CVS MAGNESIUM CITRATE PO, Take 250 mg by mouth 2 (two) times daily. , Disp: , Rfl:  .  fish oil-omega-3 fatty acids 1000 MG capsule, Take 1 g by mouth daily.  , Disp: , Rfl:  .  fluticasone (FLONASE) 50 MCG/ACT nasal spray, Place 1 spray into both nostrils daily., Disp: , Rfl:  .  levothyroxine (SYNTHROID, LEVOTHROID) 75 MCG tablet, Take 1 tablet (75 mcg total) by mouth daily., Disp: 30 tablet, Rfl: 1 .  metroNIDAZOLE (METROCREAM) 0.75 % cream, Apply 1 application topically daily as needed., Disp: , Rfl:  .  MOMETASONE FUROATE EX, Apply topically daily as needed., Disp: , Rfl:  .  Multiple Vitamin (MULTIVITAMIN) tablet, Take 1 tablet by mouth daily.  , Disp: , Rfl:  .  olopatadine (PATANOL) 0.1 % ophthalmic solution, Place 1 drop into both eyes 2 (two) times daily., Disp: , Rfl:  .  Potassium Gluconate 595 MG CAPS, Take 595 mg by mouth daily., Disp: , Rfl:  .  Propylene Glycol (SYSTANE BALANCE OP), Apply to eye., Disp: , Rfl:  .  ranitidine (ZANTAC) 150 MG tablet, Take 150 mg by mouth at bedtime., Disp: , Rfl:   No Known Allergies   Review of Systems  Pertinent items are noted in the HPI. Otherwise, ROS is negative.  Vitals:   Vitals:   02/20/18 0835  BP: 108/64  Pulse: 78  Temp: 98.1 F (36.7 C)  TempSrc: Oral  SpO2: 98%  Weight: 134 lb 12.8 oz (61.1 kg)  Height: 5\' 6"  (1.676 m)     Body mass index is 21.76 kg/m.   Physical Exam:   Physical Exam  Constitutional: She is oriented to person, place, and time. She appears well-developed and well-nourished. No distress.  HENT:  Head: Normocephalic and atraumatic.  Right Ear: External ear normal.  Left Ear: External ear normal.  Nose: Nose normal.  Mouth/Throat: Oropharynx is clear and moist.  Eyes: Pupils are equal, round, and reactive to light. Conjunctivae and EOM are normal.  Neck: Normal  range of motion. Neck supple. No thyromegaly present.  Cardiovascular: Normal rate, regular rhythm, normal heart sounds and intact distal pulses.  Pulmonary/Chest: Effort normal and breath sounds normal.  Abdominal: Soft. Bowel sounds are normal.  Musculoskeletal: Normal range of motion.  Lymphadenopathy:    She has no cervical adenopathy.  Neurological: She is alert and oriented to person, place, and time.  Skin: Skin is warm and dry. Capillary refill takes less than 2 seconds.  Psychiatric: She has a normal mood and affect. Her behavior is normal.  Nursing note and vitals reviewed.   Results for orders placed or performed in visit on 02/20/18  Magnesium  Result Value Ref Range   Magnesium 2.0 1.5 - 2.5 mg/dL  VITAMIN D 25 Hydroxy (Vit-D Deficiency, Fractures)  Result Value Ref Range   VITD 79.37 30.00 - 100.00 ng/mL  CBC with Differential/Platelet  Result Value Ref Range   WBC 4.1 4.0 - 10.5 K/uL   RBC 4.87 3.87 - 5.11 Mil/uL   Hemoglobin 14.7 12.0 - 15.0 g/dL   HCT 43.7 36.0 - 46.0 %   MCV 89.7 78.0 - 100.0 fl   MCHC 33.7 30.0 - 36.0 g/dL   RDW 13.0 11.5 - 15.5 %   Platelets 238.0 150.0 - 400.0 K/uL   Neutrophils Relative % 50.1 43.0 - 77.0 %   Lymphocytes Relative 38.5 12.0 - 46.0 %   Monocytes Relative 9.0 3.0 - 12.0 %   Eosinophils Relative 2.0 0.0 - 5.0 %   Basophils Relative 0.4 0.0 - 3.0 %   Neutro Abs 2.0 1.4 - 7.7 K/uL   Lymphs Abs 1.6 0.7 - 4.0 K/uL   Monocytes Absolute 0.4 0.1 - 1.0 K/uL   Eosinophils Absolute 0.1 0.0 - 0.7 K/uL   Basophils Absolute 0.0 0.0 - 0.1 K/uL  Comprehensive metabolic panel  Result Value Ref Range   Sodium 141 135 - 145 mEq/L   Potassium 4.7 3.5 - 5.1 mEq/L   Chloride 105 96 - 112 mEq/L   CO2 30 19 - 32 mEq/L   Glucose, Bld 97 70 - 99 mg/dL   BUN 15 6 - 23 mg/dL   Creatinine, Ser 0.79 0.40 - 1.20 mg/dL   Total Bilirubin 0.8 0.2 - 1.2 mg/dL   Alkaline Phosphatase 60 39 - 117 U/L   AST 19 0 - 37 U/L   ALT 17 0 - 35 U/L   Total  Protein 6.6 6.0 - 8.3 g/dL   Albumin 4.3 3.5 - 5.2 g/dL   Calcium 9.6 8.4 - 10.5 mg/dL   GFR 77.42 >60.00 mL/min  Lipid panel  Result Value Ref Range   Cholesterol 243 (H) 0 - 200 mg/dL   Triglycerides 85.0 0.0 -  149.0 mg/dL   HDL 76.60 >39.00 mg/dL   VLDL 17.0 0.0 - 40.0 mg/dL   LDL Cholesterol 149 (H) 0 - 99 mg/dL   Total CHOL/HDL Ratio 3    NonHDL 166.19   T4, free  Result Value Ref Range   Free T4 1.08 0.60 - 1.60 ng/dL  T3, free  Result Value Ref Range   T3, Free 3.7 2.3 - 4.2 pg/mL  TSH  Result Value Ref Range   TSH 1.26 0.35 - 4.50 uIU/mL   The 10-year ASCVD risk score Mikey Bussing DC Jr., et al., 2013) is: 3.9%   Values used to calculate the score:     Age: 64 years     Sex: Female     Is Non-Hispanic African American: No     Diabetic: No     Tobacco smoker: No     Systolic Blood Pressure: 785 mmHg     Is BP treated: No     HDL Cholesterol: 76.6 mg/dL     Total Cholesterol: 243 mg/dL  Assessment and Plan:   Sharon Harrell was seen today for establish care.  Diagnoses and all orders for this visit:  Nocturnal leg cramps Comments: Ongoing issue for patient. Discussed trial of tonic water - 2 to 3 oz q hs. Orders: -     Magnesium -     CBC with Differential/Platelet -     Comprehensive metabolic panel  Lipid screening -     Lipid panel  Vitamin D deficiency -     VITAMIN D 25 Hydroxy (Vit-D Deficiency, Fractures)  Encounter for screening for lipid disorder  Osteoporosis, unspecified osteoporosis type, unspecified pathological fracture presence Comments: Okay to continue current treatment for now. Will see if Prolia is a viable option (to decrease gastritis issue).  Non-seasonal allergic rhinitis, unspecified trigger Comments: Stable. ENT notes reviewed.   Family history of breast cancer Comments: MRI yearly through GYN.   High serum high density lipoprotein (HDL) Comments: ASCVD risk low at 3.9%.   Hepatic cyst Comments: Two "tiny" cysts identified on  recent MRI.   Acquired hypothyroidism -     T4, free -     T3, free -     TSH    . Reviewed expectations re: course of current medical issues. . Discussed self-management of symptoms. . Outlined signs and symptoms indicating need for more acute intervention. . Patient verbalized understanding and all questions were answered. Marland Kitchen Health Maintenance issues including appropriate healthy diet, exercise, and smoking avoidance were discussed with patient. . See orders for this visit as documented in the electronic medical record. . Patient received an After Visit Summary.  Briscoe Deutscher, DO [place], [place] 02/21/2018  Future Appointments  Date Time Provider [place]  08/26/2018  7:00 AM Briscoe Deutscher, DO [facility]  11/05/2018 10:30 AM Salvadore Dom, MD [place]   Records requested if needed. Time spent with the patient: 30 minutes, of which >50% was spent in obtaining information about her symptoms, reviewing her previous labs, evaluations, and treatments, counseling her about her condition (please see the discussed topics above), and developing a plan to further investigate it; she had a number of questions which I addressed.

## 2018-02-20 ENCOUNTER — Other Ambulatory Visit: Payer: Self-pay

## 2018-02-20 ENCOUNTER — Telehealth: Payer: Self-pay | Admitting: Family Medicine

## 2018-02-20 ENCOUNTER — Ambulatory Visit (INDEPENDENT_AMBULATORY_CARE_PROVIDER_SITE_OTHER): Payer: Medicare Other | Admitting: Family Medicine

## 2018-02-20 ENCOUNTER — Encounter: Payer: Self-pay | Admitting: Family Medicine

## 2018-02-20 VITALS — BP 108/64 | HR 78 | Temp 98.1°F | Ht 66.0 in | Wt 134.8 lb

## 2018-02-20 DIAGNOSIS — G4762 Sleep related leg cramps: Secondary | ICD-10-CM

## 2018-02-20 DIAGNOSIS — E039 Hypothyroidism, unspecified: Secondary | ICD-10-CM

## 2018-02-20 DIAGNOSIS — M2041 Other hammer toe(s) (acquired), right foot: Secondary | ICD-10-CM | POA: Diagnosis not present

## 2018-02-20 DIAGNOSIS — Z803 Family history of malignant neoplasm of breast: Secondary | ICD-10-CM

## 2018-02-20 DIAGNOSIS — R7989 Other specified abnormal findings of blood chemistry: Secondary | ICD-10-CM | POA: Diagnosis not present

## 2018-02-20 DIAGNOSIS — E559 Vitamin D deficiency, unspecified: Secondary | ICD-10-CM

## 2018-02-20 DIAGNOSIS — Z13220 Encounter for screening for lipoid disorders: Secondary | ICD-10-CM | POA: Diagnosis not present

## 2018-02-20 DIAGNOSIS — M810 Age-related osteoporosis without current pathological fracture: Secondary | ICD-10-CM

## 2018-02-20 DIAGNOSIS — L03031 Cellulitis of right toe: Secondary | ICD-10-CM | POA: Diagnosis not present

## 2018-02-20 DIAGNOSIS — J3089 Other allergic rhinitis: Secondary | ICD-10-CM | POA: Diagnosis not present

## 2018-02-20 DIAGNOSIS — L02611 Cutaneous abscess of right foot: Secondary | ICD-10-CM | POA: Diagnosis not present

## 2018-02-20 DIAGNOSIS — M79674 Pain in right toe(s): Secondary | ICD-10-CM | POA: Diagnosis not present

## 2018-02-20 DIAGNOSIS — K7689 Other specified diseases of liver: Secondary | ICD-10-CM | POA: Diagnosis not present

## 2018-02-20 LAB — CBC WITH DIFFERENTIAL/PLATELET
Basophils Absolute: 0 10*3/uL (ref 0.0–0.1)
Basophils Relative: 0.4 % (ref 0.0–3.0)
Eosinophils Absolute: 0.1 10*3/uL (ref 0.0–0.7)
Eosinophils Relative: 2 % (ref 0.0–5.0)
HCT: 43.7 % (ref 36.0–46.0)
Hemoglobin: 14.7 g/dL (ref 12.0–15.0)
Lymphocytes Relative: 38.5 % (ref 12.0–46.0)
Lymphs Abs: 1.6 10*3/uL (ref 0.7–4.0)
MCHC: 33.7 g/dL (ref 30.0–36.0)
MCV: 89.7 fl (ref 78.0–100.0)
Monocytes Absolute: 0.4 10*3/uL (ref 0.1–1.0)
Monocytes Relative: 9 % (ref 3.0–12.0)
Neutro Abs: 2 10*3/uL (ref 1.4–7.7)
Neutrophils Relative %: 50.1 % (ref 43.0–77.0)
Platelets: 238 10*3/uL (ref 150.0–400.0)
RBC: 4.87 Mil/uL (ref 3.87–5.11)
RDW: 13 % (ref 11.5–15.5)
WBC: 4.1 10*3/uL (ref 4.0–10.5)

## 2018-02-20 LAB — T3, FREE: T3, Free: 3.7 pg/mL (ref 2.3–4.2)

## 2018-02-20 LAB — COMPREHENSIVE METABOLIC PANEL
ALT: 17 U/L (ref 0–35)
AST: 19 U/L (ref 0–37)
Albumin: 4.3 g/dL (ref 3.5–5.2)
Alkaline Phosphatase: 60 U/L (ref 39–117)
BUN: 15 mg/dL (ref 6–23)
CO2: 30 mEq/L (ref 19–32)
Calcium: 9.6 mg/dL (ref 8.4–10.5)
Chloride: 105 mEq/L (ref 96–112)
Creatinine, Ser: 0.79 mg/dL (ref 0.40–1.20)
GFR: 77.42 mL/min (ref >60.00)
Glucose, Bld: 97 mg/dL (ref 70–99)
Potassium: 4.7 mEq/L (ref 3.5–5.1)
Sodium: 141 mEq/L (ref 135–145)
Total Bilirubin: 0.8 mg/dL (ref 0.2–1.2)
Total Protein: 6.6 g/dL (ref 6.0–8.3)

## 2018-02-20 LAB — LIPID PANEL
Cholesterol: 243 mg/dL — ABNORMAL HIGH (ref 0–200)
HDL: 76.6 mg/dL (ref >39.00)
LDL Cholesterol: 149 mg/dL — ABNORMAL HIGH (ref 0–99)
NonHDL: 166.19
Total CHOL/HDL Ratio: 3
Triglycerides: 85 mg/dL (ref 0.0–149.0)
VLDL: 17 mg/dL (ref 0.0–40.0)

## 2018-02-20 LAB — MAGNESIUM: Magnesium: 2 mg/dL (ref 1.5–2.5)

## 2018-02-20 LAB — T4, FREE: Free T4: 1.08 ng/dL (ref 0.60–1.60)

## 2018-02-20 LAB — VITAMIN D 25 HYDROXY (VIT D DEFICIENCY, FRACTURES): VITD: 79.37 ng/mL (ref 30.00–100.00)

## 2018-02-20 LAB — TSH: TSH: 1.26 u[IU]/mL (ref 0.35–4.50)

## 2018-02-20 MED ORDER — LEVOTHYROXINE SODIUM 75 MCG PO TABS: 75.0000 ug | ORAL_TABLET | Freq: Every day | ORAL | 1 refills | Status: DC

## 2018-02-20 NOTE — Patient Instructions (Addendum)
Use 2-3 oz of  tonic water for leg cramps. You can mix this with juice.

## 2018-02-20 NOTE — Telephone Encounter (Signed)
Called into pharmacy

## 2018-02-20 NOTE — Telephone Encounter (Signed)
Patient requests a refill, and requests that she [facility] [place] as this works with her plan.   MEDICATION: levothyroxine (SYNTHROID, LEVOTHROID) 75 MCG  [facility] [place]   IS THIS A 90 DAY SUPPLY : no, pt requests a 3 month  IS PATIENT OUT OF MEDICATION: no  IF NOT; HOW MUCH IS LEFT: about 1 week  LAST APPOINTMENT DATE: @4 /03/2018  NEXT APPOINTMENT DATE:@Visit  date not found  OTHER COMMENTS:    **Let patient know to contact pharmacy at the end of the day to make sure medication is ready. **  ** Please notify patient to allow 48-72 hours to process**  **Encourage patient to contact the pharmacy for refills or they can request refills through [facility]**

## 2018-02-20 NOTE — Telephone Encounter (Signed)
Left message for patient to return call.

## 2018-02-20 NOTE — Telephone Encounter (Signed)
Number busy not able to l/m

## 2018-02-20 NOTE — Telephone Encounter (Unsigned)
Copied from [place] [id number]. Topic: Quick Communication - See Telephone Encounter >> Feb 20, 2018 12:46 PM Neva Seat wrote: Tetanus Shot Pneumonia Shot Pt had her new pt visit w/ Dr. Juleen China today.  She walked out and forgot about when she needs to do the above shots. Please call pt back to discuss.

## 2018-02-20 NOTE — Telephone Encounter (Signed)
Copied from [place]. Topic: Inquiry >> Feb 20, 2018 10:47 AM Sharon Harrell, NT wrote: Patient was seen in the office today 02/20/18 and states she forgot to get her pneumonia shot and her prescription for tetanus. Patient would like to come in this evening and get those. Patient hung up while on hold but office is going to check with Dr. Juleen China and contact her back.

## 2018-02-20 NOTE — Telephone Encounter (Signed)
Second message see first for documentation.

## 2018-02-21 ENCOUNTER — Encounter: Payer: Self-pay | Admitting: Family Medicine

## 2018-02-21 DIAGNOSIS — J309 Allergic rhinitis, unspecified: Secondary | ICD-10-CM | POA: Insufficient documentation

## 2018-02-21 DIAGNOSIS — Z803 Family history of malignant neoplasm of breast: Secondary | ICD-10-CM | POA: Insufficient documentation

## 2018-02-21 DIAGNOSIS — K7689 Other specified diseases of liver: Secondary | ICD-10-CM | POA: Insufficient documentation

## 2018-02-21 DIAGNOSIS — R7989 Other specified abnormal findings of blood chemistry: Secondary | ICD-10-CM | POA: Insufficient documentation

## 2018-02-23 ENCOUNTER — Other Ambulatory Visit: Payer: Self-pay

## 2018-02-23 MED ORDER — LEVOTHYROXINE SODIUM 75 MCG PO TABS: 75.0000 ug | ORAL_TABLET | Freq: Every day | ORAL | 1 refills | Status: DC

## 2018-02-25 ENCOUNTER — Other Ambulatory Visit: Payer: Self-pay | Admitting: Surgical

## 2018-02-25 MED ORDER — LEVOTHYROXINE SODIUM 75 MCG PO TABS: 75.0000 ug | ORAL_TABLET | Freq: Every day | ORAL | 3 refills | Status: DC

## 2018-03-06 DIAGNOSIS — L03031 Cellulitis of right toe: Secondary | ICD-10-CM | POA: Diagnosis not present

## 2018-03-17 DIAGNOSIS — L03031 Cellulitis of right toe: Secondary | ICD-10-CM | POA: Diagnosis not present

## 2018-04-14 ENCOUNTER — Telehealth: Payer: Self-pay

## 2018-04-14 ENCOUNTER — Other Ambulatory Visit: Payer: Self-pay

## 2018-04-14 MED ORDER — ALENDRONATE SODIUM 70 MG PO TABS: 70.0000 mg | ORAL_TABLET | ORAL | 2 refills | Status: DC

## 2018-04-14 NOTE — Telephone Encounter (Signed)
Spoke with patient regarding refill on Fosamax. Patient states she saw her PCP regarding continuing Fosamax or switchng to Prolia and was told okay to take Prolia. Patient is still unsure. She was told by her dentist she is losing bone in her jaw. Patient very concerned and wants to get bone loss under control. Since she just changed to Medicare in April and hasn't had a BMD with them, patient wants to know if she should move up BMD? She is to have one in [address]2019. I advised patient she would need appointment to discuss with Dr.Jertson. Please advise--routed to provider

## 2018-04-14 NOTE — Telephone Encounter (Signed)
Received faxed refill request for the following:  Medication refill request: Fosomax 70mg  #12, 2R Last AEX:  09-25-17 Next AEX:   11-05-18 Last MMG (if hormonal medication request): 08-29-17 [id number] Refill authorized: Please advise  Spoke with patient and she is now on Medicare and since Ins. Has changed she needs to change pharmacies. Will send new Rx.

## 2018-04-15 NOTE — Telephone Encounter (Signed)
I think we can order the Dexa a little early for her if it will help her make a decision. Please inform and place the order.

## 2018-04-16 NOTE — Telephone Encounter (Signed)
Call placed to [facility],, spoke with Clarise Cruz. Can schedule BMD earlier, patient needs to be aware will be responsible for imaging if not covered by insurance. Stop all calcium supplements 48 hrs prior to exam. Scheduled BMD for 05/25/18 arriving at 8:10am, for 8:30am. Was advised this is the first available appt, patient can return call if needs to reschedule.

## 2018-04-16 NOTE — Telephone Encounter (Signed)
Left message to call Dagan Heinz at [phone].  

## 2018-04-17 NOTE — Telephone Encounter (Signed)
Spoke with patient, advised as seen regarding recommendation per Dr. Nelson Chimes and appt details. Patient verbalizes understanding and is agreeable to date and time.   Routing to provider for final review. Patient is agreeable to disposition. Will close encounter.

## 2018-05-08 NOTE — Telephone Encounter (Signed)
This note was entered by mistake. °

## 2018-05-20 DIAGNOSIS — L03031 Cellulitis of right toe: Secondary | ICD-10-CM | POA: Diagnosis not present

## 2018-05-25 ENCOUNTER — Ambulatory Visit
Admission: RE | Admit: 2018-05-25 | Discharge: 2018-05-25 | Disposition: A | Discharge status: Home / Self Care | Payer: Medicare Other | Source: Ambulatory Visit | Attending: Obstetrics and Gynecology | Admitting: Obstetrics and Gynecology

## 2018-05-25 DIAGNOSIS — Z8739 Personal history of other diseases of the musculoskeletal system and connective tissue: Secondary | ICD-10-CM

## 2018-07-01 ENCOUNTER — Telehealth: Payer: Self-pay | Admitting: Obstetrics and Gynecology

## 2018-07-01 ENCOUNTER — Encounter: Payer: Self-pay | Admitting: Obstetrics and Gynecology

## 2018-07-01 NOTE — Telephone Encounter (Signed)
Patient sent the following correspondence through [place]. Routing to triage to assist patient with request.  Re: The online breast cancer risk questionnaire done at last visit w/20.9 percent score which qualified me for an annual MRI covered by Insurance.     Medicare denied the 02/16/18 service which I am appealing. I do need something from you supporting the test based on my score.     Thank you very much.     Sharon Harrell

## 2018-07-01 NOTE — Telephone Encounter (Signed)
Letter written and to Dr. Talbert Nan to review and sign.   Mychart message to patient to advise that letter is pending.

## 2018-07-23 ENCOUNTER — Ambulatory Visit
Admission: RE | Admit: 2018-07-23 | Discharge: 2018-07-23 | Disposition: A | Discharge status: Home / Self Care | Payer: Medicare Other | Source: Ambulatory Visit | Attending: Obstetrics and Gynecology | Admitting: Obstetrics and Gynecology

## 2018-07-23 ENCOUNTER — Telehealth: Payer: Self-pay | Admitting: Obstetrics and Gynecology

## 2018-07-23 ENCOUNTER — Encounter: Payer: Self-pay | Admitting: Obstetrics and Gynecology

## 2018-07-23 DIAGNOSIS — Z780 Asymptomatic menopausal state: Secondary | ICD-10-CM | POA: Diagnosis not present

## 2018-07-23 DIAGNOSIS — M85852 Other specified disorders of bone density and structure, left thigh: Secondary | ICD-10-CM | POA: Diagnosis not present

## 2018-07-23 DIAGNOSIS — H1045 Other chronic allergic conjunctivitis: Secondary | ICD-10-CM | POA: Diagnosis not present

## 2018-07-23 NOTE — Telephone Encounter (Signed)
Patient sent in mychart message in response to message from Dr. Talbert Nan.   Viewed by Kelvin Cellar Runkles on 07/23/2018 1:05 PM  Written by Salvadore Dom, MD on 07/23/2018 12:00 PM  Hi Sharon Harrell,  Your bone density has improved on the Fosamax!!  I would recommend that you continue Fosamax (potentially for up to 5 years total), vit d, calcium, and regular exercise.  Please call the office with any questions.  Have a great day!  Sumner Boast

## 2018-07-23 NOTE — Telephone Encounter (Signed)
Patient sent the following correspondence through [place]. Routing to triage to assist patient with request.  I have a question about XR BONE DENSITY resulted on 07/23/18, 10:05 AM    next visit: discuss exactly what "improvement  Means in scan"   Continue discussion started last visit re/ Prolia vs. current fosamax? My blood levels did not support prolia initially, however, barely supported it last visit.   I mentioned that dentist concerned about jaw bone loss seen on panorex 2019 compared w/ 2012.   Should I move to proleia now or after 5 years of fosamax?   What to do about jaw bone loss? I will be happy to email you the files with panorex if helpful.     Thanks

## 2018-07-23 NOTE — Telephone Encounter (Signed)
Discussed messages with patient.  Reviewed improved scores in bone density.  Pt reports jaw issues at that dental visit. Advised her to call their office to confirm if there was indeed issues or not. Advised Dr. Talbert Nan would not be able to read jaw radiograph.   Patient states she needs to discuss her GI issues with her primary care and plan endoscopy. Thinks she may need upper GI evaluation as well, but wants to talk with Dr. Juleen China about this. Wants GI evaluation and then will further consider Prolia and think about risks/costs/benefits. Has appointment with Dr. Juleen China in October.   Pt will call back if would like to schedule an earlier appointment for consult to discuss bone density further.   Routing to Dr. Talbert Nan for update and will close.

## 2018-08-26 ENCOUNTER — Ambulatory Visit: Payer: Medicare Other | Admitting: Family Medicine

## 2018-08-31 ENCOUNTER — Encounter: Payer: Self-pay | Admitting: Family Medicine

## 2018-08-31 ENCOUNTER — Ambulatory Visit (INDEPENDENT_AMBULATORY_CARE_PROVIDER_SITE_OTHER): Payer: Medicare Other | Admitting: Family Medicine

## 2018-08-31 VITALS — BP 104/68 | HR 89 | Temp 98.2°F | Ht 66.0 in | Wt 132.2 lb

## 2018-08-31 DIAGNOSIS — I499 Cardiac arrhythmia, unspecified: Secondary | ICD-10-CM | POA: Diagnosis not present

## 2018-08-31 DIAGNOSIS — J069 Acute upper respiratory infection, unspecified: Secondary | ICD-10-CM

## 2018-08-31 DIAGNOSIS — R002 Palpitations: Secondary | ICD-10-CM | POA: Diagnosis not present

## 2018-08-31 DIAGNOSIS — Z23 Encounter for immunization: Secondary | ICD-10-CM | POA: Diagnosis not present

## 2018-08-31 MED ORDER — DOXYCYCLINE HYCLATE 100 MG PO TABS: 100.0000 mg | ORAL_TABLET | Freq: Two times a day (BID) | ORAL | 0 refills | Status: DC

## 2018-08-31 MED ORDER — BENZONATATE 200 MG PO CAPS: 200.0000 mg | ORAL_CAPSULE | Freq: Two times a day (BID) | ORAL | 0 refills | Status: DC | PRN

## 2018-09-01 ENCOUNTER — Telehealth: Payer: Self-pay | Admitting: Cardiovascular Disease

## 2018-09-01 ENCOUNTER — Encounter: Payer: Self-pay | Admitting: Family Medicine

## 2018-09-01 NOTE — Telephone Encounter (Signed)
Ms. Weikel requests a new patient appointment with Dr. Burt Knack.  Reiterated to her that Dr. Burt Knack is spending more time in the hospital, and that he will manage her with Richardson Dopp. After much discussion, scheduled the patient with Dr. Meda Coffee 10/29 for New Patient appointment for abnormal EKG (per PCP).  She was grateful for call and appointment.

## 2018-09-01 NOTE — Telephone Encounter (Signed)
New Message         Patient is calling today to see if she can be a new patient of Dr.Cooper. Her husband sees him and she would like to be a patient as well. Her husband is Masiah Lewing 02/04/1953. Pls advise.

## 2018-09-01 NOTE — Progress Notes (Signed)
Sharon Harrell is a 67 y.o. female here for an acute visit.  History of Present Illness:   Cough  This is a new problem. The current episode started in the past 7 days. The problem has been unchanged. The cough is non-productive. Associated symptoms include myalgias, nasal congestion, postnasal drip, rhinorrhea and a sore throat. Pertinent negatives include no chest pain, ear pain, fever, headaches, rash, shortness of breath or wheezing. The symptoms are aggravated by lying down. She has tried rest for the symptoms.   PMHx, SurgHx, SocialHx, Medications, and Allergies were reviewed in the Visit Navigator and updated as appropriate.  Current Medications:   .  alendronate (FOSAMAX) 70 MG tablet, Take 1 tablet (70 mg total) by mouth every 7 (seven) days. Take with a full glass of water on an empty stomach., Disp: 12 tablet, Rfl: 2 .  Cholecalciferol (VITAMIN D-3) 5000 UNITS TABS, Take 1 tablet by mouth daily., Disp: , Rfl:  .  [employer] MAGNESIUM CITRATE PO, Take 250 mg by mouth 2 (two) times daily. , Disp: , Rfl:  .  fish oil-omega-3 fatty acids 1000 MG capsule, Take 1 g by mouth daily.  , Disp: , Rfl:  .  fluticasone (FLONASE) 50 MCG/ACT nasal spray, Place 1 spray into both nostrils daily., Disp: , Rfl:  .  levothyroxine (SYNTHROID, LEVOTHROID) 75 MCG tablet, Take 1 tablet (75 mcg total) by mouth daily., Disp: 90 tablet, Rfl: 3 .  metroNIDAZOLE (METROCREAM) 0.75 % cream, Apply 1 application topically daily as needed., Disp: , Rfl:  .  MOMETASONE FUROATE EX, Apply topically daily as needed., Disp: , Rfl:  .  Multiple Vitamin (MULTIVITAMIN) tablet, Take 1 tablet by mouth daily.  , Disp: , Rfl:  .  olopatadine (PATANOL) 0.1 % ophthalmic solution, Place 1 drop into both eyes 2 (two) times daily., Disp: , Rfl:  .  Potassium Gluconate 595 MG CAPS, Take 595 mg by mouth daily., Disp: , Rfl:  .  Propylene Glycol (SYSTANE BALANCE OP), Apply to eye., Disp: , Rfl:  .  ranitidine (ZANTAC) 150 MG tablet, Take  150 mg by mouth at bedtime., Disp: , Rfl:   No Known Allergies   Review of Systems:   Pertinent items are noted in the HPI. Otherwise, ROS is negative.  Vitals:   Vitals:   08/31/18 1331  BP: 104/68  Pulse: 89  Temp: 98.2 F (36.8 C)  TempSrc: Oral  SpO2: 99%  Weight: 132 lb 3.2 oz (60 kg)  Height: 5\' 6"  (1.676 m)     Body mass index is 21.34 kg/m.  Physical Exam:   Physical Exam  Constitutional: She appears well-nourished.  HENT:  Head: Normocephalic and atraumatic.  Eyes: Pupils are equal, round, and reactive to light. EOM are normal.  Neck: Normal range of motion. Neck supple.  Cardiovascular: Normal rate, normal heart sounds and intact distal pulses. An irregular rhythm present.  Pulmonary/Chest: Effort normal.  Abdominal: Soft.  Skin: Skin is warm.  Psychiatric: She has a normal mood and affect. Her behavior is normal.  Nursing note and vitals reviewed.  EKG: rate 80s, multifocal ectopy.   Assessment and Plan:   Diagnoses and all orders for this visit:  Viral upper respiratory tract infection Comments: Discussed symptomatic care and red flags. Safety net Rx for Tessalon and Doxycycline provided but will likely not need.  Irregular heart rate -     EKG 12-Lead -     Ambulatory referral to Cardiology  Encounter for immunization -  Flu vaccine HIGH DOSE PF  Palpitations Comments: HR rated noted to be irregular on exam today. She endorses feeling SOB at night when in bed and while walking. Noticed palpitations started early this year.   . Reviewed expectations re: course of current medical issues. . Discussed self-management of symptoms. . Outlined signs and symptoms indicating need for more acute intervention. . Patient verbalized understanding and all questions were answered. Marland Kitchen Health Maintenance issues including appropriate healthy diet, exercise, and smoking avoidance were discussed with patient. . See orders for this visit as documented in the  electronic medical record. . Patient received an After Visit Summary.  Briscoe Deutscher, DO [place], [facility] 09/01/2018

## 2018-09-02 ENCOUNTER — Ambulatory Visit: Payer: Medicare Other | Admitting: Family Medicine

## 2018-09-03 ENCOUNTER — Telehealth: Payer: Self-pay | Admitting: Obstetrics and Gynecology

## 2018-09-03 NOTE — Telephone Encounter (Signed)
Call to patient. Patient asking if Dr. Talbert Nan had ever ordered an EKG for her? RN advised patient Dr. Talbert Nan had not ordered EKG. States in our records, dates of 08-31-18 EKG done with PCP and 10-28-11 EKG done at [facility]. Patient states she has been referred to Cardiologist and just wanted to make sure Dr. Talbert Nan had never ordered an EKG. Patient appreciative of phone call.   Routing to provider and will close encounter.

## 2018-09-03 NOTE — Telephone Encounter (Signed)
Patient called requesting a call back from the nurse. She'd like to know if she's ever had an EKG before.

## 2018-09-04 ENCOUNTER — Encounter: Payer: Self-pay | Admitting: Family Medicine

## 2018-09-04 DIAGNOSIS — R0602 Shortness of breath: Secondary | ICD-10-CM

## 2018-09-04 DIAGNOSIS — R002 Palpitations: Secondary | ICD-10-CM

## 2018-09-09 ENCOUNTER — Other Ambulatory Visit: Payer: Self-pay | Admitting: Obstetrics and Gynecology

## 2018-09-09 DIAGNOSIS — Z1231 Encounter for screening mammogram for malignant neoplasm of breast: Secondary | ICD-10-CM

## 2018-09-10 ENCOUNTER — Ambulatory Visit
Admission: RE | Admit: 2018-09-10 | Discharge: 2018-09-10 | Disposition: A | Discharge status: Home / Self Care | Payer: Medicare Other | Source: Ambulatory Visit | Attending: Obstetrics and Gynecology | Admitting: Obstetrics and Gynecology

## 2018-09-10 DIAGNOSIS — Z1231 Encounter for screening mammogram for malignant neoplasm of breast: Secondary | ICD-10-CM

## 2018-09-11 ENCOUNTER — Other Ambulatory Visit: Payer: Self-pay | Admitting: Obstetrics and Gynecology

## 2018-09-11 DIAGNOSIS — N644 Mastodynia: Secondary | ICD-10-CM

## 2018-09-14 ENCOUNTER — Telehealth: Payer: Self-pay | Admitting: Obstetrics and Gynecology

## 2018-09-14 ENCOUNTER — Other Ambulatory Visit: Payer: Self-pay

## 2018-09-14 ENCOUNTER — Ambulatory Visit (HOSPITAL_COMMUNITY): Payer: Medicare Other | Attending: Cardiology

## 2018-09-14 DIAGNOSIS — R0602 Shortness of breath: Secondary | ICD-10-CM | POA: Diagnosis not present

## 2018-09-14 DIAGNOSIS — R002 Palpitations: Secondary | ICD-10-CM | POA: Diagnosis not present

## 2018-09-14 NOTE — Telephone Encounter (Signed)
Message left to return call to [place] at [phone].  Pt needs office visit for breast check before diagnostic imaging is scheduled/ordered by Dr. Talbert Nan.

## 2018-09-14 NOTE — Telephone Encounter (Signed)
Patient returning call to [facility]. Patient stated to please call her on her direct line at work at [phone].

## 2018-09-14 NOTE — Telephone Encounter (Signed)
Patient is requesting an order for a diagnostic mammogram on both breasts for itching and redness.

## 2018-09-14 NOTE — Telephone Encounter (Signed)
Call to patient. Discussed need for breast check.  Patient had appointment for screening mammogram and reported she had some breast issues.  Reports has cardiac testing scheduled as well.  Appointment scheduled for breast check tomorrow 09-15-18 at 330 following cardiologist.   Encounter closed.

## 2018-09-15 ENCOUNTER — Ambulatory Visit (INDEPENDENT_AMBULATORY_CARE_PROVIDER_SITE_OTHER): Payer: Medicare Other | Admitting: Cardiology

## 2018-09-15 ENCOUNTER — Ambulatory Visit (INDEPENDENT_AMBULATORY_CARE_PROVIDER_SITE_OTHER): Payer: Medicare Other | Admitting: Obstetrics and Gynecology

## 2018-09-15 ENCOUNTER — Telehealth: Payer: Self-pay | Admitting: Obstetrics and Gynecology

## 2018-09-15 ENCOUNTER — Encounter: Payer: Self-pay | Admitting: Obstetrics and Gynecology

## 2018-09-15 ENCOUNTER — Other Ambulatory Visit: Payer: Self-pay

## 2018-09-15 ENCOUNTER — Encounter: Payer: Self-pay | Admitting: Cardiology

## 2018-09-15 VITALS — BP 112/68 | HR 63 | Ht 66.0 in | Wt 132.6 lb

## 2018-09-15 VITALS — BP 110/72 | HR 68 | Wt 132.4 lb

## 2018-09-15 DIAGNOSIS — N644 Mastodynia: Secondary | ICD-10-CM | POA: Diagnosis not present

## 2018-09-15 DIAGNOSIS — I493 Ventricular premature depolarization: Secondary | ICD-10-CM | POA: Diagnosis not present

## 2018-09-15 DIAGNOSIS — R072 Precordial pain: Secondary | ICD-10-CM

## 2018-09-15 DIAGNOSIS — R002 Palpitations: Secondary | ICD-10-CM | POA: Diagnosis not present

## 2018-09-15 DIAGNOSIS — R05 Cough: Secondary | ICD-10-CM | POA: Diagnosis not present

## 2018-09-15 DIAGNOSIS — E785 Hyperlipidemia, unspecified: Secondary | ICD-10-CM

## 2018-09-15 DIAGNOSIS — R0602 Shortness of breath: Secondary | ICD-10-CM | POA: Diagnosis not present

## 2018-09-15 DIAGNOSIS — R053 Chronic cough: Secondary | ICD-10-CM

## 2018-09-15 NOTE — Patient Instructions (Signed)
Medication Instructions:    Your physician recommends that you continue on your current medications as directed. Please refer to the Current Medication list given to you today.  If you need a refill on your cardiac medications before your next appointment, please call your pharmacy.     Lab work:  BMET--WHEN YOUR CORONARY CT IS SCHEDULED--WILL BE IN THE FUTURE If you have labs (blood work) drawn today and your tests are completely normal, you will receive your results only by: Marland Kitchen MyChart Message (if you have MyChart) OR . A paper copy in the mail If you have any lab test that is abnormal or we need to change your treatment, we will call you to review the results.    Testing/Procedures:  Your physician has recommended that you wear a 48 HOUR holter monitor. Holter monitors are medical devices that record the heart's electrical activity. Doctors most often use these monitors to diagnose arrhythmias. Arrhythmias are problems with the speed or rhythm of the heartbeat. The monitor is a small, portable device. You can wear one while you do your normal daily activities. This is usually used to diagnose what is causing palpitations/syncope (passing out).    CORONARY CT Please arrive at the [facility] main entrance of [facility] at xx:xx AM (30-45 minutes prior to test start time)  [facility] [place], [place] [address] [phone]  Proceed to the [facility] Radiology Department (First Floor).  Please follow these instructions carefully (unless otherwise directed):   On the Night Before the Test: . Be sure to Drink plenty of water. . Do not consume any caffeinated/decaffeinated beverages or chocolate 12 hours prior to your test. . Do not take any antihistamines 12 hours prior to your test.   On the Day of the Test: . Drink plenty of water. Do not drink any water within one hour of the test. . Do not eat any food 4 hours prior to the  test. . You may take your regular medications prior to the test.         After the Test: . Drink plenty of water. . After receiving IV contrast, you may experience a mild flushed feeling. This is normal. . On occasion, you may experience a mild rash up to 24 hours after the test. This is not dangerous. If this occurs, you can take Benadryl 25 mg and increase your fluid intake. . If you experience trouble breathing, this can be serious. If it is severe call 911 IMMEDIATELY. If it is mild, please call our office.     Follow-Up: At [facility], you and your health needs are our priority.  As part of our continuing mission to provide you with exceptional heart care, we have created designated Provider Care Teams.  These Care Teams include your primary Cardiologist (physician) and Advanced Practice Providers (APPs -  Physician Assistants and Nurse Practitioners) who all work together to provide you with the care you need, when you need it. You will need a follow up appointment in 4 months with Dr. Meda Coffee  Please call our office 2 months in advance to schedule this appointment.  You may see Dr. Meda Coffee or one of the following Advanced Practice Providers on your designated Care Team:   [place], PA-C Melina Copa, PA-C . Ermalinda Barrios, PA-C

## 2018-09-15 NOTE — Progress Notes (Signed)
Patient is scheduled for Bilateral Breast Diagnostic Mammogram and Bilateral Breast Ultrasound at [facility] on 09/21/18 at  2:30

## 2018-09-15 NOTE — Progress Notes (Signed)
GYNECOLOGY  VISIT   HPI: 67 y.o.   Married White or Caucasian Not Hispanic or Latino  female   [phone] with [name] last menstrual period was 08/18/1982 (approximate).   here for bilateral nipple tenderness/itching/changes. She went to get her mammogram and c/o feeling tightness, intermittent itching or awareness of her nipples. No discomfort in the rest of her breast. She was told to be seen for an exam.  She saw the Cardiologist today, has had some SOB and palpitations. Had an Echo yesterday. She is being set up for a heart CT.   GYNECOLOGIC HISTORY: [name] last menstrual period was 08/18/1982 (approximate). Contraception: Hysterectomy Menopausal hormone therapy: None        OB History    Gravida  2   Para  2   Term  2   Preterm  0   AB  0   Living  2     SAB  0   TAB  0   Ectopic  0   Multiple  0   Live Births  2              Patient Active Problem List   Diagnosis Date Noted  . Allergic rhinitis 02/21/2018  . Family history of breast cancer 02/21/2018  . High serum high density lipoprotein (HDL) 02/21/2018  . Hepatic cyst 02/21/2018  . Laryngitis from reflux of stomach acid 07/16/2017  . Osteoporosis 07/16/2017  . Nocturnal leg cramps 06/06/2014    Past Medical History:  Diagnosis Date  . DDD (degenerative disc disease), cervical 2006  . Depression 1/06   resolved  . Diverticulitis   . Nocturnal leg cramps 06/06/2014  . Osteoporosis 2017  . SUI (stress urinary incontinence, female) 12/07  . Thyroid disease 12/2007  . Vitamin D deficiency disease 11/07    Past Surgical History:  Procedure Laterality Date  . BREAST EXCISIONAL BIOPSY Right 1991   benign  . BREAST SURGERY    . COMBINED HYSTERECTOMY VAGINAL W/ MMK / A&P REPAIR  1983  . HAND SURGERY Left 10/2009   S-L   . TONSILLECTOMY  as child    Current Outpatient Medications  Medication Sig Dispense Refill  . alendronate (FOSAMAX) 70 MG tablet Take 1 tablet (70 mg total) by mouth  every 7 (seven) days. Take with a full glass of water on an empty stomach. 12 tablet 2  . Cholecalciferol (VITAMIN D-3) 5000 UNITS TABS Take 1 tablet by mouth daily.    . CVS MAGNESIUM CITRATE PO Take 250 mg by mouth 2 (two) times daily.     . fish oil-omega-3 fatty acids 1000 MG capsule Take 1 g by mouth daily.      . fluticasone (FLONASE) 50 MCG/ACT nasal spray Place 1 spray into both nostrils daily.    Marland Kitchen levothyroxine (SYNTHROID, LEVOTHROID) 75 MCG tablet Take 1 tablet (75 mcg total) by mouth daily. 90 tablet 3  . metroNIDAZOLE (METROCREAM) 0.75 % cream Apply 1 application topically daily as needed.    . MOMETASONE FUROATE EX Apply topically daily as needed.    . Multiple Vitamin (MULTIVITAMIN) tablet Take 1 tablet by mouth daily.      Marland Kitchen olopatadine (PATANOL) 0.1 % ophthalmic solution Place 1 drop into both eyes 2 (two) times daily.    . Potassium Gluconate 595 MG CAPS Take 595 mg by mouth daily.    Marland Kitchen Propylene Glycol (SYSTANE BALANCE OP) Apply to eye.    . ranitidine (ZANTAC) 150 MG tablet Take 150 mg by mouth  at bedtime.     No current facility-administered medications for this visit.      ALLERGIES: Patient has no known allergies.  Family History  Problem Relation Age of Onset  . Cancer Mother        cervical/breast  . Osteoarthritis Mother   . Dementia Mother   . Breast cancer Mother 63  . Endometriosis Sister   . Heart disease Brother   . Stroke Brother   . Stroke Maternal Grandmother     Social History   Socioeconomic History  . Marital status: Married    Spouse name: Not on file  . Number of children: 2  . Years of education: college-1  . Highest education level: Not on file  Occupational History    Employer: [place] [employer].    Comment: [employer]  Social Needs  . Financial resource strain: Not on file  . Food insecurity:    Worry: Not on file    Inability: Not on file  . Transportation needs:    Medical: Not on file    Non-medical: Not on file   Tobacco Use  . Smoking status: Former Smoker    Packs/day: 0.25    Years: 8.00    Pack years: 2.00    Last attempt to quit: 11/19/1971    Years since quitting: 46.8  . Smokeless tobacco: Never Used  Substance and Sexual Activity  . Alcohol use: Yes    Comment: socially  . Drug use: No  . Sexual activity: Not Currently    Partners: Male    Birth control/protection: Surgical    Comment: hysterectomy  Lifestyle  . Physical activity:    Days per week: Not on file    Minutes per session: Not on file  . Stress: Not on file  Relationships  . Social connections:    Talks on phone: Not on file    Gets together: Not on file    Attends religious service: Not on file    Active member of club or organization: Not on file    Attends meetings of clubs or organizations: Not on file    Relationship status: Not on file  . Intimate partner violence:    Fear of current or ex partner: Not on file    Emotionally abused: Not on file    Physically abused: Not on file    Forced sexual activity: Not on file  Other Topics Concern  . Not on file  Social History Narrative  . Not on file    Review of Systems  Constitutional: Negative.   HENT: Negative.   Eyes: Negative.   Respiratory: Negative.   Cardiovascular: Negative.   Gastrointestinal: Negative.   Genitourinary: Negative.   Musculoskeletal:       Bilateral burning/itching/changes to nipples  Skin: Negative.   Neurological: Negative.   Endo/Heme/Allergies: Negative.   Psychiatric/Behavioral: Negative.     PHYSICAL EXAMINATION:    BP 110/72 (BP Location: Right Arm, Patient Position: Sitting, Cuff Size: Normal)   Pulse 68   Wt 132 lb 6.4 oz (60.1 kg)   LMP 08/18/1982 (Approximate)   BMI 21.37 kg/m     General appearance: alert, cooperative and appears stated age Breasts: normal appearance, no masses or tenderness, evidence of right breast biopsy. No abnormalities of the nipples  No axillary or supraclavicular  adenopathy   ASSESSMENT Nipple discomfort bilaterally, intermittent, normal exam Patient with an increased risk of breast cancer    PLAN Diagnostic breast imaging F/u for annual exam (  already scheduled in 12/19)   An After Visit Summary was printed and given to the patient.

## 2018-09-15 NOTE — Telephone Encounter (Signed)
At check out patient noticed on AVS that her bilateral ultrasound was scheduled as 2 left breasts. Patient is requesting that to be changed to bilateral prior to her appointment.

## 2018-09-15 NOTE — Progress Notes (Signed)
Cardiology Office Note:    Date:  09/15/2018   ID:  Sharon Harrell, DOB Jun 16, 1951, MRN [id number]  PCP:  Briscoe Deutscher, DO  Cardiologist:  No primary care provider on file.  Electrophysiologist:  None   Referring MD: Briscoe Deutscher, DO   Chief complain: Chest pain, palpitations  History of Present Illness:    Sharon Harrell is a 67 y.o. female with a hx of hyperlipidemia, who has been experiencing worsening dyspnea at rest and on exertion for the last year, she has also developed cough mostly at night and early in the morning.  Cough is nonproductive.  She has been walking 30 minutes a day since her 10s, eats healthy and stays fit.  She has noticed palpitations the last seconds and are associated with mild nausea but no dizziness or syncope.  She has been taking fish oil supplements for her cholesterol.  No prior chest pain.  No family history of coronary artery disease.  Past Medical History:  Diagnosis Date  . DDD (degenerative disc disease), cervical 2006  . Depression 1/06   resolved  . Diverticulitis   . Nocturnal leg cramps 06/06/2014  . Osteoporosis 2017  . SUI (stress urinary incontinence, female) 12/07  . Thyroid disease 12/2007  . Vitamin D deficiency disease 11/07    Past Surgical History:  Procedure Laterality Date  . BREAST EXCISIONAL BIOPSY Right 1991   benign  . BREAST SURGERY    . COMBINED HYSTERECTOMY VAGINAL W/ MMK / A&P REPAIR  1983  . HAND SURGERY Left 10/2009   S-L   . TONSILLECTOMY  as child    Current Medications: Current Meds  Medication Sig  . alendronate (FOSAMAX) 70 MG tablet Take 1 tablet (70 mg total) by mouth every 7 (seven) days. Take with a full glass of water on an empty stomach.  . Cholecalciferol (VITAMIN D-3) 5000 UNITS TABS Take 1 tablet by mouth daily.  . [employer] MAGNESIUM CITRATE PO Take 250 mg by mouth 2 (two) times daily.   . fish oil-omega-3 fatty acids 1000 MG capsule Take 1 g by mouth daily.    . fluticasone (FLONASE) 50 MCG/ACT  nasal spray Place 1 spray into both nostrils daily.  Marland Kitchen levothyroxine (SYNTHROID, LEVOTHROID) 75 MCG tablet Take 1 tablet (75 mcg total) by mouth daily.  . metroNIDAZOLE (METROCREAM) 0.75 % cream Apply 1 application topically daily as needed.  . MOMETASONE FUROATE EX Apply topically daily as needed.  . Multiple Vitamin (MULTIVITAMIN) tablet Take 1 tablet by mouth daily.    Marland Kitchen olopatadine (PATANOL) 0.1 % ophthalmic solution Place 1 drop into both eyes 2 (two) times daily.  . Potassium Gluconate 595 MG CAPS Take 595 mg by mouth daily.  Marland Kitchen Propylene Glycol (SYSTANE BALANCE OP) Apply to eye.  . ranitidine (ZANTAC) 150 MG tablet Take 150 mg by mouth at bedtime.     Allergies:   Patient has no known allergies.   Social History   Socioeconomic History  . Marital status: Married    Spouse name: Not on file  . Number of children: 2  . Years of education: college-1  . Highest education level: Not on file  Occupational History    Employer: [employer].    Comment: [employer]  Social Needs  . Financial resource strain: Not on file  . Food insecurity:    Worry: Not on file    Inability: Not on file  . Transportation needs:    Medical: Not on file  Non-medical: Not on file  Tobacco Use  . Smoking status: Former Smoker    Packs/day: 0.25    Years: 8.00    Pack years: 2.00    Last attempt to quit: 11/19/1971    Years since quitting: 46.8  . Smokeless tobacco: Never Used  Substance and Sexual Activity  . Alcohol use: Yes    Comment: occaisional  . Drug use: No  . Sexual activity: Not Currently    Partners: Male    Birth control/protection: Surgical  Lifestyle  . Physical activity:    Days per week: Not on file    Minutes per session: Not on file  . Stress: Not on file  Relationships  . Social connections:    Talks on phone: Not on file    Gets together: Not on file    Attends religious service: Not on file    Active member of club or organization: Not on file     Attends meetings of clubs or organizations: Not on file    Relationship status: Not on file  Other Topics Concern  . Not on file  Social History Narrative  . Not on file     Family History: The patient's family history includes Breast cancer (age of onset: 69) in her mother; Cancer in her mother; Dementia in her mother; Endometriosis in her sister; Heart disease in her brother; Osteoarthritis in her mother; Stroke in her brother and maternal grandmother.  ROS:   Please see the history of present illness.     EKGs/Labs/Other Studies Reviewed:    The following studies were reviewed today:  EKG:  EKG is  ordered today.  The ekg ordered today demonstrates normal sinus rhythm, with minimal nonspecific ST-T wave abnormalities, otherwise normal EKG.  Personally reviewed.  Recent Labs: 02/20/2018: ALT 17; BUN 15; Creatinine, Ser 0.79; Hemoglobin 14.7; Magnesium 2.0; Platelets 238.0; Potassium 4.7; Sodium 141; TSH 1.26  Recent Lipid Panel    Component Value Date/Time   CHOL 243 (H) 02/20/2018 0909   TRIG 85.0 02/20/2018 0909   HDL 76.60 02/20/2018 0909   CHOLHDL 3 02/20/2018 0909   VLDL 17.0 02/20/2018 0909   LDLCALC 149 (H) 02/20/2018 0909    Physical Exam:    VS:  BP 112/68   Pulse 63   Ht 5\' 6"  (1.676 m)   Wt 132 lb 9.6 oz (60.1 kg)   LMP 08/18/1982 (Approximate)   BMI 21.40 kg/m     Wt Readings from Last 3 Encounters:  09/15/18 132 lb 9.6 oz (60.1 kg)  08/31/18 132 lb 3.2 oz (60 kg)  02/20/18 134 lb 12.8 oz (61.1 kg)     GEN: Well nourished, well developed in no acute distress HEENT: Normal NECK: No JVD; No carotid bruits LYMPHATICS: No lymphadenopathy CARDIAC: RRR, no murmurs, rubs, gallops RESPIRATORY:  Clear to auscultation without rales, wheezing or rhonchi  ABDOMEN: Soft, non-tender, non-distended MUSCULOSKELETAL:  No edema; No deformity  SKIN: Warm and dry NEUROLOGIC:  Alert and oriented x 3 PSYCHIATRIC:  Normal affect   TTE: 09/14/2018 - Left ventricle:  The cavity size was normal. Wall thickness was   normal. Systolic function was normal. The estimated ejection   fraction was in the range of 55% to 60%. Wall motion was normal;   there were no regional wall motion abnormalities. Doppler   parameters are consistent with abnormal left ventricular   relaxation (grade 1 diastolic dysfunction). - Mitral valve: Systolic bowing without prolapse.  Impressions:  - Normal LV systolic function;  mild diastolic dysfunction.   ASSESSMENT:    1. Chronic cough   2. SOB (shortness of breath)   3. Palpitations   4. PVC's (premature ventricular contractions)   5. Precordial pain   6. Hyperlipidemia, unspecified hyperlipidemia type    PLAN:    In order of problems listed above:  1.  Dyspnea on exertion, hyperlipidemia, we will obtain coronary CTA to evaluate for CAD and based on results decide if he will do conservative management with fish oil and red yeast rice in case of no CAD, and statins or PCSK9 inhibitors if more significant CAD. 2.  Hyperlipidemia -as above 3.  Palpitations, frequent PVCs on 1 of the EKGs from her primary care office, we will obtain 48-hour Holter monitor.  Follow-up in 4 months.  Medication Adjustments/Labs and Tests Ordered: Current medicines are reviewed at length with the patient today.  Concerns regarding medicines are outlined above.  Orders Placed This Encounter  Procedures  . CT CORONARY MORPH W/CTA COR W/SCORE W/CA W/CM &/OR WO/CM  . CT CORONARY FRACTIONAL FLOW RESERVE DATA PREP  . CT CORONARY FRACTIONAL FLOW RESERVE FLUID ANALYSIS  . Basic Metabolic Panel (BMET)  . Holter monitor - 48 hour  . EKG 12-Lead   No orders of the defined types were placed in this encounter.   There are no Patient Instructions on file for this visit.   Signed, Ena Dawley, MD  09/15/2018 2:35 PM    [place]

## 2018-09-16 NOTE — Telephone Encounter (Signed)
Reviewed breast imaging orders in Epic, bilateral Dx MMG and left and right breast US, if needed, are ordered.   Call returned to patient, left detailed message, ok per dpr. Advised as seen above, return call to office if ant additional questions/concerns.   Encounter closed.

## 2018-09-21 ENCOUNTER — Other Ambulatory Visit: Payer: Self-pay | Admitting: Obstetrics and Gynecology

## 2018-09-21 ENCOUNTER — Ambulatory Visit
Admission: RE | Admit: 2018-09-21 | Discharge: 2018-09-21 | Disposition: A | Discharge status: Home / Self Care | Payer: Medicare Other | Source: Ambulatory Visit | Attending: Obstetrics and Gynecology | Admitting: Obstetrics and Gynecology

## 2018-09-21 DIAGNOSIS — N6320 Unspecified lump in the left breast, unspecified quadrant: Secondary | ICD-10-CM

## 2018-09-21 DIAGNOSIS — N6324 Unspecified lump in the left breast, lower inner quadrant: Secondary | ICD-10-CM | POA: Diagnosis not present

## 2018-09-21 DIAGNOSIS — N644 Mastodynia: Secondary | ICD-10-CM

## 2018-09-21 DIAGNOSIS — N6323 Unspecified lump in the left breast, lower outer quadrant: Secondary | ICD-10-CM | POA: Diagnosis not present

## 2018-09-21 DIAGNOSIS — R922 Inconclusive mammogram: Secondary | ICD-10-CM | POA: Diagnosis not present

## 2018-09-28 ENCOUNTER — Other Ambulatory Visit: Payer: Self-pay | Admitting: Obstetrics and Gynecology

## 2018-09-28 ENCOUNTER — Ambulatory Visit
Admission: RE | Admit: 2018-09-28 | Discharge: 2018-09-28 | Disposition: A | Discharge status: Home / Self Care | Payer: Medicare Other | Source: Ambulatory Visit | Attending: Obstetrics and Gynecology | Admitting: Obstetrics and Gynecology

## 2018-09-28 DIAGNOSIS — N6320 Unspecified lump in the left breast, unspecified quadrant: Secondary | ICD-10-CM

## 2018-09-28 DIAGNOSIS — N6032 Fibrosclerosis of left breast: Secondary | ICD-10-CM | POA: Diagnosis not present

## 2018-10-01 ENCOUNTER — Ambulatory Visit (INDEPENDENT_AMBULATORY_CARE_PROVIDER_SITE_OTHER): Payer: Medicare Other

## 2018-10-01 DIAGNOSIS — R0602 Shortness of breath: Secondary | ICD-10-CM | POA: Diagnosis not present

## 2018-10-01 DIAGNOSIS — I493 Ventricular premature depolarization: Secondary | ICD-10-CM

## 2018-10-01 DIAGNOSIS — E785 Hyperlipidemia, unspecified: Secondary | ICD-10-CM | POA: Diagnosis not present

## 2018-10-01 DIAGNOSIS — R072 Precordial pain: Secondary | ICD-10-CM

## 2018-10-01 DIAGNOSIS — R05 Cough: Secondary | ICD-10-CM | POA: Diagnosis not present

## 2018-10-01 DIAGNOSIS — R002 Palpitations: Secondary | ICD-10-CM | POA: Diagnosis not present

## 2018-10-01 DIAGNOSIS — R053 Chronic cough: Secondary | ICD-10-CM

## 2018-10-07 ENCOUNTER — Encounter: Payer: Self-pay | Admitting: Family Medicine

## 2018-10-07 ENCOUNTER — Telehealth: Payer: Self-pay | Admitting: *Deleted

## 2018-10-07 DIAGNOSIS — I493 Ventricular premature depolarization: Secondary | ICD-10-CM | POA: Insufficient documentation

## 2018-10-07 DIAGNOSIS — R072 Precordial pain: Secondary | ICD-10-CM

## 2018-10-07 DIAGNOSIS — R002 Palpitations: Secondary | ICD-10-CM

## 2018-10-07 DIAGNOSIS — R0602 Shortness of breath: Secondary | ICD-10-CM

## 2018-10-07 NOTE — Telephone Encounter (Signed)
-----   Message from Dorothy Spark, MD sent at 10/07/2018 12:38 PM EST ----- Very frequent monomorphic PVCs contributing to total of 14% of overall beats. Referral to EP is recommended.

## 2018-10-07 NOTE — Telephone Encounter (Signed)
Spoke with the pt and informed her that per Dr Meda Coffee, her 48 hour holter monitor report showed very frequent monomorphic PVCs, contributing to total of 14% of overall beats.  Endorsed to the pt that per Dr Meda Coffee, she recommends that we refer her to see one of our EP Doctors.  Informed the pt that I will place the referral in the system, and send a message to our [place], to call the pt back and arrange this appt.  Pt verbalized understanding and agrees with this plan.

## 2018-10-08 ENCOUNTER — Other Ambulatory Visit: Payer: Medicare Other | Admitting: *Deleted

## 2018-10-08 DIAGNOSIS — D225 Melanocytic nevi of trunk: Secondary | ICD-10-CM | POA: Diagnosis not present

## 2018-10-08 DIAGNOSIS — R0602 Shortness of breath: Secondary | ICD-10-CM

## 2018-10-08 DIAGNOSIS — R002 Palpitations: Secondary | ICD-10-CM | POA: Diagnosis not present

## 2018-10-08 DIAGNOSIS — R072 Precordial pain: Secondary | ICD-10-CM

## 2018-10-08 DIAGNOSIS — D692 Other nonthrombocytopenic purpura: Secondary | ICD-10-CM | POA: Diagnosis not present

## 2018-10-08 DIAGNOSIS — E785 Hyperlipidemia, unspecified: Secondary | ICD-10-CM | POA: Diagnosis not present

## 2018-10-08 DIAGNOSIS — R05 Cough: Secondary | ICD-10-CM

## 2018-10-08 DIAGNOSIS — L718 Other rosacea: Secondary | ICD-10-CM | POA: Diagnosis not present

## 2018-10-08 DIAGNOSIS — R21 Rash and other nonspecific skin eruption: Secondary | ICD-10-CM | POA: Diagnosis not present

## 2018-10-08 DIAGNOSIS — I493 Ventricular premature depolarization: Secondary | ICD-10-CM | POA: Diagnosis not present

## 2018-10-08 DIAGNOSIS — L814 Other melanin hyperpigmentation: Secondary | ICD-10-CM | POA: Diagnosis not present

## 2018-10-08 DIAGNOSIS — L821 Other seborrheic keratosis: Secondary | ICD-10-CM | POA: Diagnosis not present

## 2018-10-08 DIAGNOSIS — L570 Actinic keratosis: Secondary | ICD-10-CM | POA: Diagnosis not present

## 2018-10-08 DIAGNOSIS — D2262 Melanocytic nevi of left upper limb, including shoulder: Secondary | ICD-10-CM | POA: Diagnosis not present

## 2018-10-08 DIAGNOSIS — R053 Chronic cough: Secondary | ICD-10-CM

## 2018-10-08 DIAGNOSIS — L720 Epidermal cyst: Secondary | ICD-10-CM | POA: Diagnosis not present

## 2018-10-08 LAB — BASIC METABOLIC PANEL
BUN/Creatinine Ratio: 21 (ref 12–28)
BUN: 18 mg/dL (ref 8–27)
CO2: 25 mmol/L (ref 20–29)
Calcium: 10.1 mg/dL (ref 8.7–10.3)
Chloride: 100 mmol/L (ref 96–106)
Creatinine, Ser: 0.87 mg/dL (ref 0.57–1.00)
GFR calc Af Amer: 80 mL/min/{1.73_m2} (ref >59)
GFR calc non Af Amer: 70 mL/min/{1.73_m2} (ref >59)
Glucose: 87 mg/dL (ref 65–99)
Potassium: 4.9 mmol/L (ref 3.5–5.2)
Sodium: 140 mmol/L (ref 134–144)

## 2018-10-12 ENCOUNTER — Other Ambulatory Visit: Payer: Medicare Other

## 2018-10-14 ENCOUNTER — Encounter: Payer: Self-pay | Admitting: Internal Medicine

## 2018-10-14 ENCOUNTER — Ambulatory Visit (INDEPENDENT_AMBULATORY_CARE_PROVIDER_SITE_OTHER): Payer: Medicare Other | Admitting: Internal Medicine

## 2018-10-14 VITALS — BP 124/62 | HR 80 | Ht 66.0 in | Wt 132.0 lb

## 2018-10-14 DIAGNOSIS — R002 Palpitations: Secondary | ICD-10-CM

## 2018-10-14 DIAGNOSIS — I493 Ventricular premature depolarization: Secondary | ICD-10-CM | POA: Diagnosis not present

## 2018-10-14 NOTE — Patient Instructions (Addendum)
Medication Instructions:  Your physician recommends that you continue on your current medications as directed. Please refer to the Current Medication list given to you today.  Labwork: None ordered.  Testing/Procedures: None ordered.  Follow-Up: Your physician wants you to follow-up in: 3 months with Dr. Taylor.      Any Other Special Instructions Will Be Listed Below (If Applicable).  If you need a refill on your cardiac medications before your next appointment, please call your pharmacy.   

## 2018-10-14 NOTE — Progress Notes (Signed)
HPI Sharon Harrell is referred by Dr. Meda Coffee for evaluation of PVC's. She is a pleasant healthy 67 yo woman with fatigue with walking who has preserved LV function and was found to have PVC's and on 48 hour holter had 30K total. These were polymorphic but one dominant morphology. The patient is symptomatic but not severely so. She appears to have developed more cardiac awareness. She does have some dyspnea. She is pending coronary CT scanning. She has not had syncope.  No Known Allergies   Current Outpatient Medications  Medication Sig Dispense Refill  . alendronate (FOSAMAX) 70 MG tablet Take 1 tablet (70 mg total) by mouth every 7 (seven) days. Take with a full glass of water on an empty stomach. 12 tablet 2  . Cholecalciferol (VITAMIN D-3) 5000 UNITS TABS Take 1 tablet by mouth daily.    . [facility]S MAGNESIUM CITRATE PO Take 250 mg by mouth 2 (two) times daily.     . fish oil-omega-3 fatty acids 1000 MG capsule Take 1 g by mouth daily.      . fluticasone (FLONASE) 50 MCG/ACT nasal spray Place 1 spray into both nostrils daily.    Marland Kitchen levothyroxine (SYNTHROID, LEVOTHROID) 75 MCG tablet Take 1 tablet (75 mcg total) by mouth daily. 90 tablet 3  . metroNIDAZOLE (METROCREAM) 0.75 % cream Apply 1 application topically daily as needed.    . MOMETASONE FUROATE EX Apply topically daily as needed.    . Multiple Vitamin (MULTIVITAMIN) tablet Take 1 tablet by mouth daily.      Marland Kitchen olopatadine (PATANOL) 0.1 % ophthalmic solution Place 1 drop into both eyes 2 (two) times daily.    . Potassium Gluconate 595 MG CAPS Take 595 mg by mouth daily.    Marland Kitchen Propylene Glycol (SYSTANE BALANCE OP) Apply to eye.    . ranitidine (ZANTAC) 150 MG tablet Take 150 mg by mouth at bedtime.     No current facility-administered medications for this visit.      Past Medical History:  Diagnosis Date  . DDD (degenerative disc disease), cervical 2006  . Depression 1/06   resolved  . Diverticulitis   . Nocturnal leg cramps  06/06/2014  . Osteoporosis 2017  . SUI (stress urinary incontinence, female) 12/07  . Thyroid disease 12/2007  . Vitamin D deficiency disease 11/07    ROS:   All systems reviewed and negative except as noted in the HPI.   Past Surgical History:  Procedure Laterality Date  . BREAST EXCISIONAL BIOPSY Right 1991   benign  . BREAST SURGERY    . COMBINED HYSTERECTOMY VAGINAL W/ MMK / A&P REPAIR  1983  . HAND SURGERY Left 10/2009   S-L   . TONSILLECTOMY  as child     Family History  Problem Relation Age of Onset  . Cancer Mother        cervical/breast  . Osteoarthritis Mother   . Dementia Mother   . Breast cancer Mother 3  . Endometriosis Sister   . Heart disease Brother   . Stroke Brother   . Stroke Maternal Grandmother      Social History   Socioeconomic History  . Marital status: Married    Spouse name: Not on file  . Number of children: 2  . Years of education: college-1  . Highest education level: Not on file  Occupational History    Employer: [employer].    Comment: [employer]  Social Needs  . Financial resource strain: Not  on file  . Food insecurity:    Worry: Not on file    Inability: Not on file  . Transportation needs:    Medical: Not on file    Non-medical: Not on file  Tobacco Use  . Smoking status: Former Smoker    Packs/day: 0.25    Years: 8.00    Pack years: 2.00    Last attempt to quit: 11/19/1971    Years since quitting: 46.9  . Smokeless tobacco: Never Used  Substance and Sexual Activity  . Alcohol use: Yes    Comment: socially  . Drug use: No  . Sexual activity: Not Currently    Partners: Male    Birth control/protection: Surgical    Comment: hysterectomy  Lifestyle  . Physical activity:    Days per week: Not on file    Minutes per session: Not on file  . Stress: Not on file  Relationships  . Social connections:    Talks on phone: Not on file    Gets together: Not on file    Attends religious service: Not on  file    Active member of club or organization: Not on file    Attends meetings of clubs or organizations: Not on file    Relationship status: Not on file  . Intimate partner violence:    Fear of current or ex partner: Not on file    Emotionally abused: Not on file    Physically abused: Not on file    Forced sexual activity: Not on file  Other Topics Concern  . Not on file  Social History Narrative  . Not on file     BP 124/62   Pulse 80   Ht 5\' 6"  (1.676 m)   Wt 132 lb (59.9 kg)   LMP 08/18/1982 (Approximate)   BMI 21.31 kg/m   Physical Exam:  Well appearing NAD HEENT: Unremarkable Neck:  No JVD, no thyromegally Lymphatics:  No adenopathy Back:  No CVA tenderness Lungs:  Clear with no wheezes HEART:  Regular rate rhythm, no murmurs, no rubs, no clicks Abd:  soft, positive bowel sounds, no organomegally, no rebound, no guarding Ext:  2 plus pulses, no edema, no cyanosis, no clubbing Skin:  No rashes no nodules Neuro:  CN II through XII intact, motor grossly intact  EKG - NSR with PVC's; reviewed   Assess/Plan: 1. Symptomatic PVC's - we discussed the different treatment options with the patient. I have recommended we initially try lifestyle modification. The patient does not appear too eager to try medical therapy. We briefly discussed ablation though I do not think this would be appropriate at this time. She will cut out caffeine and ETOH.   Mikle Bosworth.D.

## 2018-10-21 ENCOUNTER — Encounter (HOSPITAL_COMMUNITY): Payer: Self-pay

## 2018-10-21 ENCOUNTER — Encounter: Payer: Self-pay | Admitting: *Deleted

## 2018-10-21 ENCOUNTER — Ambulatory Visit (HOSPITAL_COMMUNITY)
Admission: RE | Admit: 2018-10-21 | Discharge: 2018-10-21 | Disposition: A | Discharge status: Home / Self Care | Payer: Medicare Other | Source: Ambulatory Visit | Attending: Cardiology | Admitting: Cardiology

## 2018-10-21 ENCOUNTER — Institutional Professional Consult (permissible substitution): Payer: Medicare Other | Admitting: Cardiology

## 2018-10-21 ENCOUNTER — Telehealth: Payer: Self-pay | Admitting: *Deleted

## 2018-10-21 DIAGNOSIS — R002 Palpitations: Secondary | ICD-10-CM

## 2018-10-21 DIAGNOSIS — R053 Chronic cough: Secondary | ICD-10-CM

## 2018-10-21 DIAGNOSIS — I493 Ventricular premature depolarization: Secondary | ICD-10-CM | POA: Insufficient documentation

## 2018-10-21 DIAGNOSIS — E785 Hyperlipidemia, unspecified: Secondary | ICD-10-CM

## 2018-10-21 DIAGNOSIS — R072 Precordial pain: Secondary | ICD-10-CM | POA: Insufficient documentation

## 2018-10-21 DIAGNOSIS — Z006 Encounter for examination for normal comparison and control in clinical research program: Secondary | ICD-10-CM

## 2018-10-21 DIAGNOSIS — R0602 Shortness of breath: Secondary | ICD-10-CM | POA: Insufficient documentation

## 2018-10-21 DIAGNOSIS — R001 Bradycardia, unspecified: Secondary | ICD-10-CM | POA: Insufficient documentation

## 2018-10-21 DIAGNOSIS — R05 Cough: Secondary | ICD-10-CM | POA: Insufficient documentation

## 2018-10-21 MED ORDER — METOPROLOL TARTRATE 5 MG/5ML IV SOLN
INTRAVENOUS | Status: AC
  Filled 2018-10-21: qty 10

## 2018-10-21 MED ORDER — METOPROLOL TARTRATE 5 MG/5ML IV SOLN
5.0000 mg | INTRAVENOUS | Status: DC | PRN
  Administered 2018-10-21: 5 mg via INTRAVENOUS

## 2018-10-21 MED ORDER — NITROGLYCERIN 0.4 MG SL SUBL: 0.8000 mg | SUBLINGUAL_TABLET | Freq: Once | SUBLINGUAL | Status: DC

## 2018-10-21 MED ORDER — NITROGLYCERIN 0.4 MG SL SUBL
SUBLINGUAL_TABLET | SUBLINGUAL | Status: AC
  Filled 2018-10-21: qty 2

## 2018-10-21 NOTE — Sedation Documentation (Signed)
Patient in sinus rhythm with frequent PVC's (in and out of bigemny). Dr. Meda Coffee informed of patients rhythm and order given by physician to cancel CT heart study. Dr. Meda Coffee will call office and arrange for patient to have a stress test. Patient informed of change in plan of care.

## 2018-10-21 NOTE — Research (Signed)
Sharon Harrell met inclusion and exclusion criteria.  The informed consent form, study requirements and expectations were reviewed with the subject and questions and concerns were addressed prior to the signing of the consent form.  The subject verbalized understanding of the trial requirements.  The subject agreed to participate in the CADFEM trial and signed the informed consent.  The informed consent was obtained prior to performance of any protocol-specific procedures for the subject.  A copy of the signed informed consent was given to the subject and a copy was placed in the subject's medical record.   Dionne Bucy. [employer]

## 2018-10-21 NOTE — Telephone Encounter (Signed)
Sharon Spark, MD  Nuala Alpha, LPN        Karlene Einstein,      Please order D SPECT nuclear stress test Carlton Adam for patient [name], [name] [birth year]. We had to cancel her CT today as she was in bigeminy the entire time.   Thank you,   [place] Siren

## 2018-10-21 NOTE — Telephone Encounter (Signed)
Spoke with the pt and informed her that per Dr Meda Coffee, being they had to cancel her Coronary CT today due to being in bigeminy the entire time, she therefore wants to order alternative testing.  Endorsed to the pt that per Dr Meda Coffee, she wants her to have a Lexiscan myoview done--ORDER ON D-SPECT instead. Informed the pt that I will place the order in the system and have our [facility] schedulers call her back to have this appt arranged.  Went over her lexiscan instructions with her over the phone, and endorsed to her that I will send these instructions to her as well, via her mychart account.  Pt verbalized understanding and agrees with this plan.

## 2018-10-22 ENCOUNTER — Encounter: Payer: Self-pay | Admitting: Obstetrics and Gynecology

## 2018-10-22 NOTE — Telephone Encounter (Signed)
Pt schedule for her Lexiscan on 10/28/18 at [place].  Pt made aware of appt date and time by [facility] scheduling.

## 2018-10-23 ENCOUNTER — Telehealth: Payer: Self-pay | Admitting: Obstetrics and Gynecology

## 2018-10-23 ENCOUNTER — Telehealth: Payer: Self-pay | Admitting: Certified Nurse Midwife

## 2018-10-23 ENCOUNTER — Ambulatory Visit: Payer: Self-pay | Admitting: Certified Nurse Midwife

## 2018-10-23 NOTE — Telephone Encounter (Signed)
Spoke with patient. Patient reports urinary urgency and pressure that started 10/21/18, only voiding small amounts. Vaginal odor this morning. She was drinking a lot of water on 12/4 in preparation for cardiac CT, this was later cancelled. Denies vaginal bleeding, fever/chills, lower back pain, N/V. Requesting RX.  Advised patient would need to be seen for OV for further evaluation. Patient declined OV with Dr. Talbert Nan for this morning, scheduled with Melvia Heaps, CNM today at 3:45pm.   Routing to provider for final review. Patient is agreeable to disposition. Will close encounter.  Cc: Dr. Talbert Nan

## 2018-10-23 NOTE — Telephone Encounter (Signed)
Patient sent the following correspondence through [place]. Routing to triage to assist patient with request.  think I am getting a urinary infection. Would you be willing to phone me in a little red pill in case I need it prior to seeing you on 12/19?   I am in the middle of a lot of cardiac testing but hope to have definitive answers by my next appointment with you.   Thank you

## 2018-10-23 NOTE — Telephone Encounter (Signed)
Patient called and cancelled her appointment for today with Melvia Heaps, CNM for "urinary frequency/odor." She said she is feeling better and tested her urine at work to be sure it was negative.

## 2018-10-26 ENCOUNTER — Telehealth (HOSPITAL_COMMUNITY): Payer: Self-pay | Admitting: *Deleted

## 2018-10-26 NOTE — Telephone Encounter (Signed)
Left message on voicemail per DPR in reference to upcoming appointment scheduled on 10/28/18 at 0715 with detailed instructions given per Myocardial Perfusion Study Information Sheet for the test. LM to arrive 15 minutes early, and that it is imperative to arrive on time for appointment to keep from having the test rescheduled. If you need to cancel or reschedule your appointment, please call the office within 24 hours of your appointment. Failure to do so may result in a cancellation of your appointment, and a $50 no show fee. Phone number given for call back for any questions. Marshella Tello, [place]

## 2018-10-28 ENCOUNTER — Ambulatory Visit (HOSPITAL_COMMUNITY): Payer: Medicare Other | Attending: Cardiology

## 2018-10-28 ENCOUNTER — Encounter: Payer: Self-pay | Admitting: Family Medicine

## 2018-10-28 DIAGNOSIS — R002 Palpitations: Secondary | ICD-10-CM | POA: Insufficient documentation

## 2018-10-28 DIAGNOSIS — I493 Ventricular premature depolarization: Secondary | ICD-10-CM | POA: Diagnosis not present

## 2018-10-28 DIAGNOSIS — R0602 Shortness of breath: Secondary | ICD-10-CM | POA: Diagnosis not present

## 2018-10-28 DIAGNOSIS — R072 Precordial pain: Secondary | ICD-10-CM | POA: Insufficient documentation

## 2018-10-28 DIAGNOSIS — E785 Hyperlipidemia, unspecified: Secondary | ICD-10-CM | POA: Diagnosis not present

## 2018-10-28 LAB — MYOCARDIAL PERFUSION IMAGING
LV dias vol: 77 mL (ref 46–106)
LV sys vol: 32 mL
Peak HR: 110 {beats}/min
Rest HR: 70 {beats}/min
SDS: 1
SRS: 0
SSS: 1
TID: 0.88

## 2018-10-28 MED ORDER — TECHNETIUM TC 99M TETROFOSMIN IV KIT
10.1000 | PACK | Freq: Once | INTRAVENOUS | Status: AC | PRN
  Administered 2018-10-28: 10.1 via INTRAVENOUS
  Filled 2018-10-28: qty 11

## 2018-10-28 MED ORDER — REGADENOSON 0.4 MG/5ML IV SOLN
0.4000 mg | Freq: Once | INTRAVENOUS | Status: AC
  Administered 2018-10-28: 0.4 mg via INTRAVENOUS

## 2018-10-28 MED ORDER — TECHNETIUM TC 99M TETROFOSMIN IV KIT
32.6000 | PACK | Freq: Once | INTRAVENOUS | Status: AC | PRN
  Administered 2018-10-28: 32.6 via INTRAVENOUS
  Filled 2018-10-28: qty 33

## 2018-10-28 NOTE — Telephone Encounter (Signed)
Spoke with the pt and endorsed to her that I spoke with our Nuclear dept, and her complaints of feeling "hyped up," can be a mild side effect some pts can experience with the lexi.  Pt states she has no cp, doe, sob, palpitations, pre-syncopal or syncopal episodes. Endorsed to the pt that with the tracer, and she was also given caffeine thereafter, she can have those mild side effects of feeling hyped up, racey mind, and feeling energized.  Informed the pt that per the RN in our Nuclear dept, she should refrain from caffeine, chocolate, decaff, soda, or tea the rest of the day.  Informed the pt that she should push PO fluids on herself and eat a good meal.  Informed the pt that per the RN in nuclear,  the tracer is already out of her body, and well metabolized.  Advised the pt to call the office back if she experiences any other symptoms, with recommendations provided.  Pt verbalized understanding and agrees with this plan.

## 2018-11-02 ENCOUNTER — Encounter: Payer: Self-pay | Admitting: Family Medicine

## 2018-11-02 DIAGNOSIS — Z23 Encounter for immunization: Secondary | ICD-10-CM | POA: Diagnosis not present

## 2018-11-04 NOTE — Progress Notes (Deleted)
67 y.o. G18P2002 Married White or Caucasian Not Hispanic or Latino female here for annual exam.  She has an elevated risk of breast cancer. She just had a benign breast biopsy last month, due for f/u imaging on the left in 6 months.     [name] last menstrual period was 08/18/1982 (approximate).          Sexually active: {yes no:314532}  The current method of family planning is {contraception:315051}.    Exercising: {yes no:314532}  {types:19826} Smoker:  {YES NO:22349}  Health Maintenance: Pap:  Years ago, no abnormal pap before hysterectomy  History of abnormal Pap:  no MMG:  09/21/2018 screening with left and right breast u/s Birads 4 suspicious, 09/28/2018 left breast biopsyBENIGN BREAST TISSUE WITH DENSE STROMAL FIBROSIS, needs follow up in 6 months Colonoscopy:  2010 diverticulitis  BMD:   07/23/2018 Osteopenia TDaP: unsure Gardasil: N/A   reports that she quit smoking about 46 years ago. She has a 2.00 pack-year smoking history. She has never used smokeless tobacco. She reports current alcohol use. She reports that she does not use drugs.  Past Medical History:  Diagnosis Date  . DDD (degenerative disc disease), cervical 2006  . Depression 1/06   resolved  . Diverticulitis   . Nocturnal leg cramps 06/06/2014  . Osteoporosis 2017  . SUI (stress urinary incontinence, female) 12/07  . Thyroid disease 12/2007  . Vitamin D deficiency disease 11/07    Past Surgical History:  Procedure Laterality Date  . BREAST EXCISIONAL BIOPSY Right 1991   benign  . BREAST SURGERY    . COMBINED HYSTERECTOMY VAGINAL W/ MMK / A&P REPAIR  1983  . HAND SURGERY Left 10/2009   S-L   . TONSILLECTOMY  as child    Current Outpatient Medications  Medication Sig Dispense Refill  . alendronate (FOSAMAX) 70 MG tablet Take 1 tablet (70 mg total) by mouth every 7 (seven) days. Take with a full glass of water on an empty stomach. 12 tablet 2  . Cholecalciferol (VITAMIN D-3) 5000 UNITS TABS Take 1 tablet  by mouth daily.    . [facility] MAGNESIUM CITRATE PO Take 250 mg by mouth 2 (two) times daily.     . fish oil-omega-3 fatty acids 1000 MG capsule Take 1 g by mouth daily.      . fluticasone (FLONASE) 50 MCG/ACT nasal spray Place 1 spray into both nostrils daily.    Marland Kitchen levothyroxine (SYNTHROID, LEVOTHROID) 75 MCG tablet Take 1 tablet (75 mcg total) by mouth daily. 90 tablet 3  . metroNIDAZOLE (METROCREAM) 0.75 % cream Apply 1 application topically daily as needed.    . MOMETASONE FUROATE EX Apply topically daily as needed.    . Multiple Vitamin (MULTIVITAMIN) tablet Take 1 tablet by mouth daily.      Marland Kitchen olopatadine (PATANOL) 0.1 % ophthalmic solution Place 1 drop into both eyes 2 (two) times daily.    . Potassium Gluconate 595 MG CAPS Take 595 mg by mouth daily.    Marland Kitchen Propylene Glycol (SYSTANE BALANCE OP) Apply to eye.    . ranitidine (ZANTAC) 150 MG tablet Take 150 mg by mouth at bedtime.     No current facility-administered medications for this visit.     Family History  Problem Relation Age of Onset  . Cancer Mother        cervical/breast  . Osteoarthritis Mother   . Dementia Mother   . Breast cancer Mother 62  . Endometriosis Sister   . Heart disease Brother   .  Stroke Brother   . Stroke Maternal Grandmother     Review of Systems  Exam:   LMP 08/18/1982 (Approximate)   Weight change: @WEIGHTCHANGE @ Height:      Ht Readings from Last 3 Encounters:  10/28/18 5\' 6"  (1.676 m)  10/14/18 5\' 6"  (1.676 m)  09/15/18 5\' 6"  (1.676 m)    General appearance: alert, cooperative and appears stated age Head: Normocephalic, without obvious abnormality, atraumatic Neck: no adenopathy, supple, symmetrical, trachea midline and thyroid {CHL AMB PHY EX THYROID NORM DEFAULT:[phone]::"normal to inspection and palpation"} Lungs: clear to auscultation bilaterally Cardiovascular: regular rate and rhythm Breasts: {Exam; breast:13139::"normal appearance, no masses or tenderness"} Abdomen: soft,  non-tender; non distended,  no masses,  no organomegaly Extremities: extremities normal, atraumatic, no cyanosis or edema Skin: Skin color, texture, turgor normal. No rashes or lesions Lymph nodes: Cervical, supraclavicular, and axillary nodes normal. No abnormal inguinal nodes palpated Neurologic: Grossly normal   Pelvic: External genitalia:  no lesions              Urethra:  normal appearing urethra with no masses, tenderness or lesions              Bartholins and Skenes: normal                 Vagina: normal appearing vagina with normal color and discharge, no lesions              Cervix: {CHL AMB PHY EX CERVIX NORM DEFAULT:[phone]::"no lesions"}               Bimanual Exam:  Uterus:  {CHL AMB PHY EX UTERUS NORM [phone]::"normal size, contour, position, consistency, mobility, non-tender"}              Adnexa: {CHL AMB PHY EX ADNEXA NO MASS DEFAULT:[phone]::"no mass, fullness, tenderness"}               Rectovaginal: Confirms               Anus:  normal sphincter tone, no lesions  Chaperone was present for exam.  A:  Well Woman with normal exam  Elevated risk of breast cancer  Osteoporosis, improved on Fosamax  P:   No pap needed  Discussed breast MRI  F/U left breast imaging in 5/20  DEXA in fall of 2021  Discussed breast self exam  Discussed calcium and vit D intake  Labs with primary  Colon cancer screening due in 2020

## 2018-11-05 ENCOUNTER — Ambulatory Visit: Payer: Medicare Other | Admitting: Obstetrics and Gynecology

## 2018-11-14 ENCOUNTER — Other Ambulatory Visit: Payer: Self-pay | Admitting: Obstetrics and Gynecology

## 2018-11-16 NOTE — Telephone Encounter (Signed)
3 month supply sent. Please have her set up an annual exam.

## 2018-11-16 NOTE — Telephone Encounter (Signed)
Medication refill request:Fosamax 70 mg  Last AEX:  09/25/17 last OV 09/15/18 Next AEX: nothing scheduled at this time Last MMG (if hormonal medication request): 09/21/18 Birads 4 suspicious Bx done 09/28/18 BENIGN BREAST TISSUE WITH DENSE STROMAL FIBROSIS Refill authorized: #12 with 0RF

## 2019-01-06 ENCOUNTER — Ambulatory Visit (INDEPENDENT_AMBULATORY_CARE_PROVIDER_SITE_OTHER): Payer: Medicare Other | Admitting: Internal Medicine

## 2019-01-06 ENCOUNTER — Encounter: Payer: Self-pay | Admitting: Internal Medicine

## 2019-01-06 VITALS — BP 128/64 | HR 68 | Ht 66.0 in | Wt 133.0 lb

## 2019-01-06 DIAGNOSIS — I493 Ventricular premature depolarization: Secondary | ICD-10-CM

## 2019-01-06 DIAGNOSIS — R002 Palpitations: Secondary | ICD-10-CM

## 2019-01-06 MED ORDER — METOPROLOL SUCCINATE ER 25 MG PO TB24: 25.0000 mg | ORAL_TABLET | Freq: Every day | ORAL | 11 refills | Status: DC

## 2019-01-06 NOTE — Progress Notes (Signed)
HPI [name] is returns today for ongoing evaluation of symptomatic PVC's. When I saw her last she was not too symptomatic despite having over 15K PVC's over 24 hours. I recommended that she stop caffeine as she was using too much and she was not too interested in taking a beta blocker. In the interim she has cut back on the caffeine but the palpitations do not seem to be any better. She had a coronary CT ordered but I do not find any results. She then underwent a Gated spect which was low risk with preserved LV function and no ischemia. She has some dyspnea and admits to dietary indiscretion with sodium. No angina. No Known Allergies   Current Outpatient Medications  Medication Sig Dispense Refill  . alendronate (FOSAMAX) 70 MG tablet TAKE 1 TABLET BY MOUTH EVERY 7 DAYS. TAKE WITH A FULL GLASS OF WATER ON AN EMPTY STOMACH. 12 tablet 0  . Cholecalciferol (VITAMIN D-3) 5000 UNITS TABS Take 1 tablet by mouth daily.    . [facility] MAGNESIUM CITRATE PO Take 250 mg by mouth daily.     . Famotidine (PEPCID PO) Take by mouth daily as needed.    . fish oil-omega-3 fatty acids 1000 MG capsule Take 1 g by mouth daily.      . fluticasone (FLONASE) 50 MCG/ACT nasal spray Place 1 spray into both nostrils daily as needed.     Marland Kitchen levothyroxine (SYNTHROID, LEVOTHROID) 75 MCG tablet Take 1 tablet (75 mcg total) by mouth daily. 90 tablet 3  . metroNIDAZOLE (METROCREAM) 0.75 % cream Apply 1 application topically daily as needed.    . MOMETASONE FUROATE EX Apply topically daily as needed.    . Multiple Vitamin (MULTIVITAMIN) tablet Take 1 tablet by mouth daily.      Marland Kitchen olopatadine (PATANOL) 0.1 % ophthalmic solution Place 1 drop into both eyes 2 (two) times daily.    . Potassium Gluconate 595 MG CAPS Take 595 mg by mouth daily.     No current facility-administered medications for this visit.      Past Medical History:  Diagnosis Date  . DDD (degenerative disc disease), cervical 2006  . Depression 1/06   resolved  . Diverticulitis   . Nocturnal leg cramps 06/06/2014  . Osteoporosis 2017  . SUI (stress urinary incontinence, female) 12/07  . Thyroid disease 12/2007  . Vitamin D deficiency disease 11/07    ROS:   All systems reviewed and negative except as noted in the HPI.   Past Surgical History:  Procedure Laterality Date  . BREAST EXCISIONAL BIOPSY Right 1991   benign  . BREAST SURGERY    . COMBINED HYSTERECTOMY VAGINAL W/ MMK / A&P REPAIR  1983  . HAND SURGERY Left 10/2009   S-L   . TONSILLECTOMY  as child     Family History  Problem Relation Age of Onset  . Cancer Mother        cervical/breast  . Osteoarthritis Mother   . Dementia Mother   . Breast cancer Mother 47  . Endometriosis Sister   . Heart disease Brother   . Stroke Brother   . Stroke Maternal Grandmother      Social History   Socioeconomic History  . Marital status: Married    Spouse name: Not on file  . Number of children: 2  . Years of education: college-1  . Highest education level: Not on file  Occupational History    Employer: [employer].  Comment: [employer]  Social Needs  . Financial resource strain: Not on file  . Food insecurity:    Worry: Not on file    Inability: Not on file  . Transportation needs:    Medical: Not on file    Non-medical: Not on file  Tobacco Use  . Smoking status: Former Smoker    Packs/day: 0.25    Years: 8.00    Pack years: 2.00    Last attempt to quit: 11/19/1971    Years since quitting: 47.1  . Smokeless tobacco: Never Used  Substance and Sexual Activity  . Alcohol use: Yes    Comment: socially  . Drug use: No  . Sexual activity: Not Currently    Partners: Male    Birth control/protection: Surgical    Comment: hysterectomy  Lifestyle  . Physical activity:    Days per week: Not on file    Minutes per session: Not on file  . Stress: Not on file  Relationships  . Social connections:    Talks on phone: Not on file    Gets  together: Not on file    Attends religious service: Not on file    Active member of club or organization: Not on file    Attends meetings of clubs or organizations: Not on file    Relationship status: Not on file  . Intimate partner violence:    Fear of current or ex partner: Not on file    Emotionally abused: Not on file    Physically abused: Not on file    Forced sexual activity: Not on file  Other Topics Concern  . Not on file  Social History Narrative  . Not on file     BP 128/64   Pulse 68   Ht 5\' 6"  (1.676 m)   Wt 133 lb (60.3 kg)   LMP 08/18/1982 (Approximate)   BMI 21.47 kg/m   Physical Exam:  Well appearing NAD HEENT: Unremarkable Neck:  No JVD, no thyromegally Lymphatics:  No adenopathy Back:  No CVA tenderness Lungs:  Clear HEART:  Regular rate rhythm, no murmurs, no rubs, no clicks Abd:  soft, positive bowel sounds, no organomegally, no rebound, no guarding Ext:  2 plus pulses, no edema, no cyanosis, no clubbing Skin:  No rashes no nodules Neuro:  CN II through XII intact, motor grossly intact   DEVICE  Normal device function.  See PaceArt for details.   Assess/Plan: 1. PVC's - we discussed the treatment options and I have recommended she start taking toprol 25 mg daily. She will continue to try to avoid caffeine. 2. 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   Mikle Bosworth.D.

## 2019-01-06 NOTE — Patient Instructions (Addendum)
Medication Instructions:  Your physician has recommended you make the following change in your medication:   1.  Start taking Toprol XL (metoprolol succinate) 25 mg ---one tablet by mouth daily   Labwork: None ordered.  Testing/Procedures: None ordered.  Follow-Up: Your physician wants you to follow-up in: 4 months with Dr. Lovena Le.      Any Other Special Instructions Will Be Listed Below (If Applicable).  If you need a refill on your cardiac medications before your next appointment, please call your pharmacy.    Metoprolol extended-release tablets What is this medicine? METOPROLOL (me TOE proe lole) is a beta-blocker. Beta-blockers reduce the workload on the heart and help it to beat more regularly. This medicine is used to treat high blood pressure and to prevent chest pain. It is also used to after a heart attack and to prevent an additional heart attack from occurring. This medicine may be used for other purposes; ask your health care provider or pharmacist if you have questions. COMMON BRAND NAME(S): toprol, Toprol XL What should I tell my health care provider before I take this medicine? They need to know if you have any of these conditions: -diabetes -heart or vessel disease like slow heart rate, worsening heart failure, heart block, sick sinus syndrome or Raynaud's disease -kidney disease -liver disease -lung or breathing disease, like asthma or emphysema -pheochromocytoma -thyroid disease -an unusual or allergic reaction to metoprolol, other beta-blockers, medicines, foods, dyes, or preservatives -pregnant or trying to get pregnant -breast-feeding How should I use this medicine? Take this medicine by mouth with a glass of water. Follow the directions on the prescription label. Do not crush or chew. Take this medicine with or immediately after meals. Take your doses at regular intervals. Do not take more medicine than directed. Do not stop taking this medicine suddenly.  This could lead to serious heart-related effects. Talk to your pediatrician regarding the use of this medicine in children. While this drug may be prescribed for children as young as 6 years for selected conditions, precautions do apply. Overdosage: If you think you have taken too much of this medicine contact a poison control center or emergency room at once. NOTE: This medicine is only for you. Do not share this medicine with others. What if I miss a dose? If you miss a dose, take it as soon as you can. If it is almost time for your next dose, take only that dose. Do not take double or extra doses. What may interact with this medicine? This medicine may interact with the following medications: -certain medicines for blood pressure, heart disease, irregular heart beat -certain medicines for depression, like monoamine oxidase (MAO) inhibitors, fluoxetine, or paroxetine -clonidine -dobutamine -epinephrine -isoproterenol -reserpine This list may not describe all possible interactions. Give your health care provider a list of all the medicines, herbs, non-prescription drugs, or dietary supplements you use. Also tell them if you smoke, drink alcohol, or use illegal drugs. Some items may interact with your medicine. What should I watch for while using this medicine? Visit your doctor or health care professional for regular check ups. Contact your doctor right away if your symptoms worsen. Check your blood pressure and pulse rate regularly. Ask your health care professional what your blood pressure and pulse rate should be, and when you should contact them. You may get drowsy or dizzy. Do not drive, use machinery, or do anything that needs mental alertness until you know how this medicine affects you. Do not sit or  stand up quickly, especially if you are an older patient. This reduces the risk of dizzy or fainting spells. Contact your doctor if these symptoms continue. Alcohol may interfere with the  effect of this medicine. Avoid alcoholic drinks. What side effects may I notice from receiving this medicine? Side effects that you should report to your doctor or health care professional as soon as possible: -allergic reactions like skin rash, itching or hives -cold or numb hands or feet -depression -difficulty breathing -faint -fever with sore throat -irregular heartbeat, chest pain -rapid weight gain -swollen legs or ankles Side effects that usually do not require medical attention (report to your doctor or health care professional if they continue or are bothersome): -anxiety or nervousness -change in sex drive or performance -dry skin -headache -nightmares or trouble sleeping -short term memory loss -stomach upset or diarrhea -unusually tired This list may not describe all possible side effects. Call your doctor for medical advice about side effects. You may report side effects to FDA at 1-800-FDA-1088. Where should I keep my medicine? Keep out of the reach of children. Store at room temperature between 15 and 30 degrees C (59 and 86 degrees F). Throw away any unused medicine after the expiration date. NOTE: This sheet is a summary. It may not cover all possible information. If you have questions about this medicine, talk to your doctor, pharmacist, or health care provider.  2019 Elsevier/Gold Standard (2013-07-09 14:41:37)

## 2019-01-07 ENCOUNTER — Encounter: Payer: Self-pay | Admitting: Family Medicine

## 2019-01-11 ENCOUNTER — Encounter: Payer: Self-pay | Admitting: Cardiology

## 2019-01-26 DIAGNOSIS — H43393 Other vitreous opacities, bilateral: Secondary | ICD-10-CM | POA: Diagnosis not present

## 2019-01-26 DIAGNOSIS — H43813 Vitreous degeneration, bilateral: Secondary | ICD-10-CM | POA: Diagnosis not present

## 2019-01-26 DIAGNOSIS — H35371 Puckering of macula, right eye: Secondary | ICD-10-CM | POA: Diagnosis not present

## 2019-01-26 DIAGNOSIS — H2513 Age-related nuclear cataract, bilateral: Secondary | ICD-10-CM | POA: Diagnosis not present

## 2019-01-28 ENCOUNTER — Ambulatory Visit (INDEPENDENT_AMBULATORY_CARE_PROVIDER_SITE_OTHER): Payer: Medicare Other | Admitting: Cardiology

## 2019-01-28 ENCOUNTER — Other Ambulatory Visit: Payer: Self-pay

## 2019-01-28 ENCOUNTER — Encounter: Payer: Self-pay | Admitting: Cardiology

## 2019-01-28 VITALS — BP 122/60 | HR 60 | Ht 66.0 in | Wt 134.0 lb

## 2019-01-28 DIAGNOSIS — R0602 Shortness of breath: Secondary | ICD-10-CM

## 2019-01-28 DIAGNOSIS — I493 Ventricular premature depolarization: Secondary | ICD-10-CM

## 2019-01-28 MED ORDER — METOPROLOL SUCCINATE ER 25 MG PO TB24: 25.0000 mg | ORAL_TABLET | Freq: Every day | ORAL | 3 refills | Status: DC

## 2019-01-28 NOTE — Patient Instructions (Signed)
Medication Instructions:   Your physician recommends that you continue on your current medications as directed. Please refer to the Current Medication list given to you today.  If you need a refill on your cardiac medications before your next appointment, please call your pharmacy.      Follow-Up:  YOU WILL FOLLOW-UP ON AN AS NEEDED BASIS WITH DR. Meda Coffee  DR Lovena Le IN EP WILL BE PRIMARILY TAKING OVER YOUR CARE, BASED ON YOUR CARDIAC ISSUES.  PLEASE FOLLOW-UP WITH DR. Lovena Le AS PLANNED

## 2019-01-30 NOTE — Progress Notes (Signed)
Cardiology Office Note:    Date:  01/30/2019   ID:  Sharon Harrell, DOB 1951-10-25, MRN [id number]  PCP:  Briscoe Deutscher, DO  Cardiologist:  Ena Dawley, MD  Electrophysiologist:  None   Referring MD: Briscoe Deutscher, DO   Chief complain: Chest pain, palpitations  History of Present Illness:    Sharon Harrell is a 68 y.o. female with a hx of hyperlipidemia, who has been experiencing worsening dyspnea at rest and on exertion for the last year, she has also developed cough mostly at night and early in the morning.  Cough is nonproductive.  She has been walking 30 minutes a day since her 33s, eats healthy and stays fit.  She has noticed palpitations the last seconds and are associated with mild nausea but no dizziness or syncope.  She has been taking fish oil supplements for her cholesterol.  No prior chest pain.  No family history of coronary artery disease.  She was seen by DR Lovena Le who started her on Toprol XL 25 mg po daily for frequent PVCs- 15$ of all the beats, her symptoms improved. She now denies any chest pain or SOB.   Past Medical History:  Diagnosis Date  . DDD (degenerative disc disease), cervical 2006  . Depression 1/06   resolved  . Diverticulitis   . Dysphonia   . Laryngitis from reflux of stomach acid    dietary modifications, Omeprazole, Zantac [facility]  . Nocturnal leg cramps 06/06/2014  . Non-seasonal allergic rhinitis   . Osteoporosis 2017  . Post-nasal drip    Flonase, [facility]  . SUI (stress urinary incontinence, female) 12/07  . Thyroid disease 12/2007  . Vitamin D deficiency disease 11/07    Past Surgical History:  Procedure Laterality Date  . BREAST EXCISIONAL BIOPSY Right 1991   benign  . BREAST SURGERY    . COMBINED HYSTERECTOMY VAGINAL W/ MMK / A&P REPAIR  1983  . HAND SURGERY Left 10/2009   S-L   . TONSILLECTOMY  as child  . WISDOM TOOTH EXTRACTION      Current Medications: Current Meds  Medication Sig  .  alendronate (FOSAMAX) 70 MG tablet TAKE 1 TABLET BY MOUTH EVERY 7 DAYS. TAKE WITH A FULL GLASS OF WATER ON AN EMPTY STOMACH.  Marland Kitchen Cholecalciferol (VITAMIN D-3) 5000 UNITS TABS Take 1 tablet by mouth daily.  . CVS MAGNESIUM CITRATE PO Take 250 mg by mouth daily.   . Famotidine (PEPCID PO) Take by mouth daily as needed.  . fish oil-omega-3 fatty acids 1000 MG capsule Take 1 g by mouth daily.    . fluticasone (FLONASE) 50 MCG/ACT nasal spray Place 1 spray into both nostrils daily as needed.   Marland Kitchen levothyroxine (SYNTHROID, LEVOTHROID) 75 MCG tablet Take 1 tablet (75 mcg total) by mouth daily.  . metoprolol succinate (TOPROL-XL) 25 MG 24 hr tablet Take 1 tablet (25 mg total) by mouth daily.  . metroNIDAZOLE (METROCREAM) 0.75 % cream Apply 1 application topically daily as needed.  . MOMETASONE FUROATE EX Apply topically daily as needed.  . Multiple Vitamin (MULTIVITAMIN) tablet Take 1 tablet by mouth daily.    Marland Kitchen olopatadine (PATANOL) 0.1 % ophthalmic solution Place 1 drop into both eyes 2 (two) times daily.  . Potassium Gluconate 595 MG CAPS Take 595 mg by mouth daily.  . [DISCONTINUED] metoprolol succinate (TOPROL-XL) 25 MG 24 hr tablet Take 1 tablet (25 mg total) by mouth daily.     Allergies:   Patient has no  known allergies.   Social History   Socioeconomic History  . Marital status: Married    Spouse name: Not on file  . Number of children: 2  . Years of education: college-1  . Highest education level: Not on file  Occupational History    Employer: [employer].    Comment: [employer]  Social Needs  . Financial resource strain: Not on file  . Food insecurity:    Worry: Not on file    Inability: Not on file  . Transportation needs:    Medical: Not on file    Non-medical: Not on file  Tobacco Use  . Smoking status: Former Smoker    Packs/day: 0.25    Years: 8.00    Pack years: 2.00    Last attempt to quit: 11/19/1971    Years since quitting: 47.2  . Smokeless tobacco:  Never Used  Substance and Sexual Activity  . Alcohol use: Yes    Comment: socially  . Drug use: No  . Sexual activity: Not Currently    Partners: Male    Birth control/protection: Surgical    Comment: hysterectomy  Lifestyle  . Physical activity:    Days per week: Not on file    Minutes per session: Not on file  . Stress: Not on file  Relationships  . Social connections:    Talks on phone: Not on file    Gets together: Not on file    Attends religious service: Not on file    Active member of club or organization: Not on file    Attends meetings of clubs or organizations: Not on file    Relationship status: Not on file  Other Topics Concern  . Not on file  Social History Narrative  . Not on file     Family History: The patient's family history includes Breast cancer (age of onset: 56) in her mother; Cancer in her mother; Dementia in her mother; Endometriosis in her sister; Heart disease in her brother; Osteoarthritis in her mother; Stroke in her brother and maternal grandmother.  ROS:   Please see the history of present illness.     EKGs/Labs/Other Studies Reviewed:    The following studies were reviewed today:  EKG:  EKG is  ordered today.  The ekg ordered today demonstrates normal sinus rhythm, with minimal nonspecific ST-T wave abnormalities, otherwise normal EKG.  Personally reviewed.  Recent Labs: 02/20/2018: ALT 17; Hemoglobin 14.7; Magnesium 2.0; Platelets 238.0; TSH 1.26 10/08/2018: BUN 18; Creatinine, Ser 0.87; Potassium 4.9; Sodium 140  Recent Lipid Panel    Component Value Date/Time   CHOL 243 (H) 02/20/2018 0909   TRIG 85.0 02/20/2018 0909   HDL 76.60 02/20/2018 0909   CHOLHDL 3 02/20/2018 0909   VLDL 17.0 02/20/2018 0909   LDLCALC 149 (H) 02/20/2018 0909    Physical Exam:    VS:  BP 122/60   Pulse 60   Ht 5\' 6"  (1.676 m)   Wt 134 lb (60.8 kg)   LMP 08/18/1982 (Approximate)   BMI 21.63 kg/m     Wt Readings from Last 3 Encounters:  01/28/19 134  lb (60.8 kg)  01/06/19 133 lb (60.3 kg)  10/28/18 132 lb (59.9 kg)     GEN: Well nourished, well developed in no acute distress HEENT: Normal NECK: No JVD; No carotid bruits LYMPHATICS: No lymphadenopathy CARDIAC: RRR, no murmurs, rubs, gallops RESPIRATORY:  Clear to auscultation without rales, wheezing or rhonchi  ABDOMEN: Soft, non-tender, non-distended MUSCULOSKELETAL:  No  edema; No deformity  SKIN: Warm and dry NEUROLOGIC:  Alert and oriented x 3 PSYCHIATRIC:  Normal affect   TTE: 09/14/2018 - Left ventricle: The cavity size was normal. Wall thickness was   normal. Systolic function was normal. The estimated ejection   fraction was in the range of 55% to 60%. Wall motion was normal;   there were no regional wall motion abnormalities. Doppler   parameters are consistent with abnormal left ventricular   relaxation (grade 1 diastolic dysfunction). - Mitral valve: Systolic bowing without prolapse.  Impressions:  - Normal LV systolic function; mild diastolic dysfunction.   ASSESSMENT:    1. Frequent PVCs    PLAN:    In order of problems listed above:  1.  Dyspnea on exertion, hyperlipidemia, stress echocardiogram was negative for ischemia. 2. Palpitations, frequent PVCs - 14% of overall beats, followed by Dr Lovena Le, improved symptoms with Toprol XL 25 mg po daily, normal LVEF and no ischemia on the stress echo.  Follow-up in 4 months.  Medication Adjustments/Labs and Tests Ordered: Current medicines are reviewed at length with the patient today.  Concerns regarding medicines are outlined above.  No orders of the defined types were placed in this encounter.  Meds ordered this encounter  Medications  . metoprolol succinate (TOPROL-XL) 25 MG 24 hr tablet    Sig: Take 1 tablet (25 mg total) by mouth daily.    Dispense:  90 tablet    Refill:  3    Patient Instructions  Medication Instructions:   Your physician recommends that you continue on your current  medications as directed. Please refer to the Current Medication list given to you today.  If you need a refill on your cardiac medications before your next appointment, please call your pharmacy.      Follow-Up:  YOU WILL FOLLOW-UP ON AN AS NEEDED BASIS WITH DR. Meda Coffee  DR Lovena Le IN EP WILL BE PRIMARILY TAKING OVER YOUR CARE, BASED ON YOUR CARDIAC ISSUES.  PLEASE FOLLOW-UP WITH DR. Lovena Le AS PLANNED      Signed, Ena Dawley, MD  01/30/2019 9:16 PM    [place]

## 2019-02-02 DIAGNOSIS — I788 Other diseases of capillaries: Secondary | ICD-10-CM | POA: Diagnosis not present

## 2019-02-02 DIAGNOSIS — L570 Actinic keratosis: Secondary | ICD-10-CM | POA: Diagnosis not present

## 2019-02-02 DIAGNOSIS — L710 Perioral dermatitis: Secondary | ICD-10-CM | POA: Diagnosis not present

## 2019-02-18 ENCOUNTER — Encounter: Payer: Self-pay | Admitting: Physical Therapy

## 2019-02-18 DIAGNOSIS — H43813 Vitreous degeneration, bilateral: Secondary | ICD-10-CM | POA: Insufficient documentation

## 2019-02-18 DIAGNOSIS — H35371 Puckering of macula, right eye: Secondary | ICD-10-CM | POA: Insufficient documentation

## 2019-02-18 DIAGNOSIS — H2513 Age-related nuclear cataract, bilateral: Secondary | ICD-10-CM | POA: Insufficient documentation

## 2019-02-24 ENCOUNTER — Encounter: Payer: Self-pay | Admitting: Family Medicine

## 2019-02-24 ENCOUNTER — Ambulatory Visit (INDEPENDENT_AMBULATORY_CARE_PROVIDER_SITE_OTHER): Payer: Medicare Other | Admitting: Family Medicine

## 2019-02-24 VITALS — Temp 98.5°F | Ht 66.0 in | Wt 133.0 lb

## 2019-02-24 DIAGNOSIS — S61419S Laceration without foreign body of unspecified hand, sequela: Secondary | ICD-10-CM

## 2019-02-24 DIAGNOSIS — L03119 Cellulitis of unspecified part of limb: Secondary | ICD-10-CM

## 2019-02-24 MED ORDER — DOXYCYCLINE HYCLATE 100 MG PO TABS: 100.0000 mg | ORAL_TABLET | Freq: Two times a day (BID) | ORAL | 0 refills | Status: DC

## 2019-02-24 MED ORDER — MUPIROCIN 2 % EX OINT: 1.0000 "application " | TOPICAL_OINTMENT | Freq: Two times a day (BID) | CUTANEOUS | 0 refills | Status: DC

## 2019-02-24 NOTE — Telephone Encounter (Signed)
Worked in for virtual visit today with Dr. Juleen China.

## 2019-02-24 NOTE — Progress Notes (Addendum)
Virtual Visit via Video   I connected with Sharon Harrell  by a video enabled telemedicine application and verified that I am speaking with the correct person using two identifiers. Location patient: Home Location provider: [facility], Office Persons participating in the virtual visit: Madailein, Londo, DO   I discussed the limitations of evaluation and management by telemedicine and the availability of in person appointments. The patient expressed understanding and agreed to proceed.  Subjective:   HPI:   Hi, not sure if my hand is healing well from 4/1 where I hit it against an edge of where 2 walls come together. It sliced it like a knife. I banged it again (not on same wall) on 4/6. It is very red and occasionally throbs. Did not want it to get out of control before I called. See image.  Review of Systems  Constitutional: Negative for chills, fever, malaise/fatigue and weight loss.  Respiratory: Negative for cough, shortness of breath and wheezing.   Cardiovascular: Negative for chest pain, palpitations and leg swelling.  Gastrointestinal: Negative for abdominal pain, constipation, diarrhea, nausea and vomiting.  Genitourinary: Negative for dysuria and urgency.  Musculoskeletal: Negative for joint pain and myalgias.  Skin:       Laceration of hand, dorsum.  Neurological: Negative for dizziness and headaches.  Psychiatric/Behavioral: Negative for depression, substance abuse and suicidal ideas. The patient is not nervous/anxious.     Patient Active Problem List   Diagnosis Date Noted  . Macular puckering, right eye 02/18/2019  . Vitreous degeneration of both eyes 02/18/2019  . Age-related nuclear cataract, bilateral 02/18/2019  . Frequent PVCs 10/07/2018  . Allergic rhinitis 02/21/2018  . Family history of breast cancer 02/21/2018  . High serum high density lipoprotein (HDL) 02/21/2018  . Hepatic cyst 02/21/2018  . Laryngitis from reflux of stomach acid  07/16/2017  . Osteoporosis 07/16/2017  . Nocturnal leg cramps 06/06/2014    Social History   Tobacco Use  . Smoking status: Former Smoker    Packs/day: 0.25    Years: 8.00    Pack years: 2.00    Last attempt to quit: 11/19/1971    Years since quitting: 47.3  . Smokeless tobacco: Never Used  Substance Use Topics  . Alcohol use: Yes    Comment: socially   Current Outpatient Medications:  .  alendronate (FOSAMAX) 70 MG tablet, TAKE 1 TABLET BY MOUTH EVERY 7 DAYS. TAKE WITH A FULL GLASS OF WATER ON AN EMPTY STOMACH., Disp: 12 tablet, Rfl: 0 .  Cholecalciferol (VITAMIN D-3) 5000 UNITS TABS, Take 1 tablet by mouth daily., Disp: , Rfl:  .  CVS MAGNESIUM CITRATE PO, Take 250 mg by mouth daily. , Disp: , Rfl:  .  Famotidine (PEPCID PO), Take by mouth daily as needed., Disp: , Rfl:  .  fish oil-omega-3 fatty acids 1000 MG capsule, Take 1 g by mouth daily.  , Disp: , Rfl:  .  fluticasone (FLONASE) 50 MCG/ACT nasal spray, Place 1 spray into both nostrils daily as needed. , Disp: , Rfl:  .  levothyroxine (SYNTHROID, LEVOTHROID) 75 MCG tablet, Take 1 tablet (75 mcg total) by mouth daily., Disp: 90 tablet, Rfl: 3 .  metoprolol succinate (TOPROL-XL) 25 MG 24 hr tablet, Take 1 tablet (25 mg total) by mouth daily., Disp: 90 tablet, Rfl: 3 .  metroNIDAZOLE (METROCREAM) 0.75 % cream, Apply 1 application topically daily as needed., Disp: , Rfl:  .  MOMETASONE FUROATE EX, Apply topically daily as needed.,  Disp: , Rfl:  .  Multiple Vitamin (MULTIVITAMIN) tablet, Take 1 tablet by mouth daily.  , Disp: , Rfl:  .  olopatadine (PATANOL) 0.1 % ophthalmic solution, Place 1 drop into both eyes 2 (two) times daily., Disp: , Rfl:  .  Potassium Gluconate 595 MG CAPS, Take 595 mg by mouth daily., Disp: , Rfl:   No Known Allergies  Objective:   VITALS: Per patient if applicable, see vitals. GENERAL: Alert, appears well and in no acute distress. HEENT: Atraumatic, conjunctiva clear, no obvious abnormalities on  inspection of external nose and ears. NECK: Normal movements of the head and neck. CARDIOPULMONARY: No increased WOB. Speaking in clear sentences. I:E ratio WNL.  MS: Moves all visible extremities without noticeable abnormality. PSYCH: Pleasant and cooperative, well-groomed. Speech normal rate and rhythm. Affect is appropriate. Insight and judgement are appropriate. Attention is focused, linear, and appropriate.  NEURO: CN grossly intact. Oriented as arrived to appointment on time with no prompting. Moves both UE equally.     Assessment and Plan:   Delano was seen today for cut on right hand.  Diagnoses and all orders for this visit:  Laceration of hand without foreign body, unspecified laterality, sequela -     mupirocin ointment (BACTROBAN) 2 %; Place 1 application into the nose 2 (two) times daily.  Cellulitis of upper extremity, unspecified laterality -     doxycycline (VIBRA-TABS) 100 MG tablet; Take 1 tablet (100 mg total) by mouth 2 (two) times daily.    . Reviewed expectations re: course of current medical issues. . Discussed self-management of symptoms. . Outlined signs and symptoms indicating need for more acute intervention. . Patient verbalized understanding and all questions were answered. Marland Kitchen Health Maintenance issues including appropriate healthy diet, exercise, and smoking avoidance were discussed with patient. . See orders for this visit as documented in the electronic medical record.  Briscoe Deutscher, DO

## 2019-02-25 ENCOUNTER — Other Ambulatory Visit: Payer: Self-pay

## 2019-02-25 ENCOUNTER — Ambulatory Visit: Payer: Medicare Other | Admitting: Physician Assistant

## 2019-03-03 ENCOUNTER — Encounter: Payer: Self-pay | Admitting: Family Medicine

## 2019-03-04 NOTE — Telephone Encounter (Signed)
See note  Copied from [place] [phone]. Topic: General - Other >> Mar 03, 2019  3:08 PM Leward Quan A wrote: Reason for CRM: Patient called to say that she need the complete copy of the office visit note for visit on 02/24/2019 and also she need proof of her Tetanus shot which she had on 02/25/2019 also would like a copy of her message she sent to Dr Juleen China on 02/24/2019 via My Chart she is needing these things mailed to her so she can be reimbursed from her [place] accident insurance policy. If there are any questions please call patient at Ph# [phone]

## 2019-03-07 ENCOUNTER — Other Ambulatory Visit: Payer: Self-pay | Admitting: Obstetrics and Gynecology

## 2019-03-08 NOTE — Telephone Encounter (Signed)
Medication refill request: Alendronate  Last AEX:  09/25/17 JJ Last OV: 09/15/18  Next AEX: none scheduled Last MMG (if hormonal medication request): 09/21/18 Bilateral Diagnostic MM/US -- BIRADS 4 Suspicious density c Refill authorized: Order pended #12 w/0 refills if authorized

## 2019-03-08 NOTE — Telephone Encounter (Signed)
Spoke with patient, advised as seen below per Dr. Talbert Nan. AEX scheduled for 10/07/19 at 1330 with Dr. Talbert Nan. Patient verbalzies understanding and is agreeable. Encounter closed.

## 2019-03-08 NOTE — Telephone Encounter (Signed)
Alendronate refilled for 6 months. She had her last AE in 11/18, medicare will allow another one in 11/20, please schedule

## 2019-04-09 ENCOUNTER — Telehealth: Payer: Self-pay | Admitting: Internal Medicine

## 2019-04-09 NOTE — Telephone Encounter (Signed)
New message    [facility] for pt to call back about appt on 06.23.20 with Dr. Lovena Le. Need to RS appt. If pt returns call, please reach out via secure chat and I will speak to pt.

## 2019-04-13 NOTE — Telephone Encounter (Signed)
Follow up     [facility] for pt to return call about appt on June 23rd with Dr. Lovena Le. Need to RS appt per RN. If pt returns call, please reach out via secure chat. I will speak with pt.

## 2019-05-05 ENCOUNTER — Other Ambulatory Visit: Payer: Self-pay | Admitting: Family Medicine

## 2019-05-06 NOTE — Telephone Encounter (Signed)
Last OV: 02/24/19 Next OV: none scheduled at this time Rx last filled: 02/25/18  Please advise

## 2019-05-11 ENCOUNTER — Ambulatory Visit: Payer: Medicare Other | Admitting: Internal Medicine

## 2019-06-01 ENCOUNTER — Other Ambulatory Visit: Payer: Self-pay | Admitting: Obstetrics and Gynecology

## 2019-06-01 DIAGNOSIS — N6019 Diffuse cystic mastopathy of unspecified breast: Secondary | ICD-10-CM

## 2019-06-08 DIAGNOSIS — K5904 Chronic idiopathic constipation: Secondary | ICD-10-CM | POA: Diagnosis not present

## 2019-06-08 DIAGNOSIS — K219 Gastro-esophageal reflux disease without esophagitis: Secondary | ICD-10-CM | POA: Diagnosis not present

## 2019-06-08 DIAGNOSIS — Z1211 Encounter for screening for malignant neoplasm of colon: Secondary | ICD-10-CM | POA: Diagnosis not present

## 2019-06-08 DIAGNOSIS — R1310 Dysphagia, unspecified: Secondary | ICD-10-CM | POA: Diagnosis not present

## 2019-06-08 LAB — CBC AND DIFFERENTIAL
HCT: 42 (ref 36–46)
Hemoglobin: 14.4 (ref 12.0–16.0)
Platelets: 209 (ref 150–399)
WBC: 5.4

## 2019-06-08 LAB — BASIC METABOLIC PANEL
BUN: 20 (ref 4–21)
Glucose: 80
Sodium: 143 (ref 137–147)

## 2019-06-08 LAB — HEPATIC FUNCTION PANEL
ALT: 27 (ref 7–35)
AST: 23 (ref 13–35)
Alkaline Phosphatase: 74 (ref 25–125)
Bilirubin, Total: 0.3

## 2019-06-10 ENCOUNTER — Ambulatory Visit
Admission: RE | Admit: 2019-06-10 | Discharge: 2019-06-10 | Disposition: A | Discharge status: Home / Self Care | Payer: Medicare Other | Source: Ambulatory Visit | Attending: Obstetrics and Gynecology | Admitting: Obstetrics and Gynecology

## 2019-06-10 ENCOUNTER — Other Ambulatory Visit: Payer: Self-pay

## 2019-06-10 DIAGNOSIS — N6019 Diffuse cystic mastopathy of unspecified breast: Secondary | ICD-10-CM

## 2019-06-10 DIAGNOSIS — N6323 Unspecified lump in the left breast, lower outer quadrant: Secondary | ICD-10-CM | POA: Diagnosis not present

## 2019-06-10 DIAGNOSIS — N6324 Unspecified lump in the left breast, lower inner quadrant: Secondary | ICD-10-CM | POA: Diagnosis not present

## 2019-07-01 ENCOUNTER — Other Ambulatory Visit: Payer: Self-pay

## 2019-07-01 DIAGNOSIS — K219 Gastro-esophageal reflux disease without esophagitis: Secondary | ICD-10-CM | POA: Insufficient documentation

## 2019-07-01 DIAGNOSIS — R1310 Dysphagia, unspecified: Secondary | ICD-10-CM | POA: Insufficient documentation

## 2019-07-01 DIAGNOSIS — K5904 Chronic idiopathic constipation: Secondary | ICD-10-CM | POA: Insufficient documentation

## 2019-07-01 DIAGNOSIS — R1319 Other dysphagia: Secondary | ICD-10-CM | POA: Insufficient documentation

## 2019-07-14 DIAGNOSIS — R1310 Dysphagia, unspecified: Secondary | ICD-10-CM | POA: Diagnosis not present

## 2019-07-14 DIAGNOSIS — Z1211 Encounter for screening for malignant neoplasm of colon: Secondary | ICD-10-CM | POA: Diagnosis not present

## 2019-07-14 DIAGNOSIS — K5730 Diverticulosis of large intestine without perforation or abscess without bleeding: Secondary | ICD-10-CM | POA: Diagnosis not present

## 2019-07-14 DIAGNOSIS — K219 Gastro-esophageal reflux disease without esophagitis: Secondary | ICD-10-CM | POA: Diagnosis not present

## 2019-07-14 DIAGNOSIS — K2970 Gastritis, unspecified, without bleeding: Secondary | ICD-10-CM | POA: Diagnosis not present

## 2019-07-14 LAB — HM COLONOSCOPY

## 2019-07-19 ENCOUNTER — Ambulatory Visit: Payer: Medicare Other | Admitting: Internal Medicine

## 2019-07-20 NOTE — Progress Notes (Signed)
Cardiology Office Note Date:  07/20/2019  Patient ID:  Sharon Harrell [birth year], MRN [id number] PCP:  Sharon Deutscher, DO  Cardiologist:  Dr. Meda Harrell Electrophysiologist; Dr. Lovena Harrell    Chief Complaint:  4 month follow up  History of Present Illness: Sharon Harrell is a 68 y.o. female with history of hypothyroidism, PVCs (15K in 24hrs)  She comes in today to be seen for Dr. Lovena Harrell.  Last seen by him in Feb 2020, at that time, she had been having palpitations, did not want to take BB, was recommended reduction in her caffeine intake, this did not seem to improve her palpitations.  He mentioned having had a low risk stress test had been done, CTa ordered but not completed.  She was started then on Toprl 25mg  dialy.  Subsequently she was seen by Dr. Meda Harrell in March with reports of unusual DOE,  nocturnal cough.  No changes were made noting a negative stress test, TTE with normal LVEF, palpitations were better with Toprol.  02/20/18: TSH 1.26, Mag 2.0, K+ 4.7 10/08/18:   K+ 4.9  She is not sure how much if any the metoprolol has helped.  She is more aware of the PVCs at night when laying down, less so during the day.  When she feels them at night she tries to deep breath/slow her breath down but feels like the PVCs won't let her relax her breathing.  She has developed what she describes a feeling of a band type pressure/squeezing around her chest when walking for exercise with her husband , and with stairs.  It seems to radiate eventually to her back.  She remarks that it is not oppressive, and most often does not require her to stop what she is doing and denies associated SOB, palpitations, no diaphoresis.  She has sometimes just kept going other times she does need to slow at least and it resolves.   She has a hard time dating when this started, she doesn't think was back in March but probably a few months. No dizziness, near syncope or syncope.  Never has had rest symptoms.    Past Medical  History:  Diagnosis Date   Cataract    DDD (degenerative disc disease), cervical 2006   Depression 1/06   resolved   Diverticulitis    Dysphonia    Laryngitis from reflux of stomach acid    dietary modifications, Omeprazole, Zantac [facility]   Nocturnal leg cramps 06/06/2014   Non-seasonal allergic rhinitis    Osteoporosis 2017   Post-nasal drip    Flonase, [facility]   SUI (stress urinary incontinence, female) 12/07   Thyroid disease 12/2007   Vitamin D deficiency disease 11/07    Past Surgical History:  Procedure Laterality Date   BREAST EXCISIONAL BIOPSY Right 1991   benign   BREAST SURGERY     COMBINED HYSTERECTOMY VAGINAL W/ MMK / A&P REPAIR  1983   HAND SURGERY Left 10/2009   S-L    LIPOSUCTION     TONSILLECTOMY  as child   WISDOM TOOTH EXTRACTION      Current Outpatient Medications  Medication Sig Dispense Refill   alendronate (FOSAMAX) 70 MG tablet TAKE 1 TABLET BY MOUTH EVERY 7 DAYS. TAKE WITH A FULL GLASS OF WATER ON AN EMPTY STOMACH. 12 tablet 1   Cholecalciferol (VITAMIN D-3) 5000 UNITS TABS Take 1 tablet by mouth daily.     CVS MAGNESIUM CITRATE PO Take 250 mg by mouth daily.  Famotidine (PEPCID PO) Take by mouth daily as needed.     fish oil-omega-3 fatty acids 1000 MG capsule Take 1 g by mouth daily.       fluticasone (FLONASE) 50 MCG/ACT nasal spray Place 1 spray into both nostrils daily as needed.      levothyroxine (SYNTHROID) 75 MCG tablet TAKE 1 TABLET BY MOUTH EVERY DAY 90 tablet 3   metoprolol succinate (TOPROL-XL) 25 MG 24 hr tablet Take 1 tablet (25 mg total) by mouth daily. 90 tablet 3   metroNIDAZOLE (METROCREAM) 0.75 % cream Apply 1 application topically daily as needed.     MOMETASONE FUROATE EX Apply topically daily as needed.     Multiple Vitamin (MULTIVITAMIN) tablet Take 1 tablet by mouth daily.       mupirocin ointment (BACTROBAN) 2 % Place 1 application into the nose 2 (two) times daily.  22 g 0   olopatadine (PATANOL) 0.1 % ophthalmic solution Place 1 drop into both eyes 2 (two) times daily.     Potassium Gluconate 595 MG CAPS Take 595 mg by mouth daily.     No current facility-administered medications for this visit.     Allergies:   Patient has no known allergies.   Social History:  The patient  reports that she quit smoking about 47 years ago. She has a 2.00 pack-year smoking history. She has never used smokeless tobacco. She reports current alcohol use. She reports that she does not use drugs.   Family History:  The patient's family history includes Breast cancer (age of onset: 56) in her mother; Cancer in her mother; Dementia in her mother; Endometriosis in her sister; Heart disease in her brother; Osteoarthritis in her mother; Stroke in her brother and maternal grandmother.  ROS:  Please see the history of present illness.  All other systems are reviewed and otherwise negative.   PHYSICAL EXAM:  VS:  LMP 08/18/1982 (Approximate)  BMI: There is no height or weight on file to calculate BMI. Well nourished, well developed, in no acute distress  HEENT: normocephalic, atraumatic  Neck: no JVD, carotid bruits or masses Cardiac:  RRR; no significant murmurs, no rubs, or gallops Lungs:  CTA b/l, no wheezing, rhonchi or rales  Abd: soft, nontender MS: no deformity or atrophy Ext: no edema  Skin: warm and dry, no rash Neuro:  No gross deficits appreciated Psych: euthymic mood, full affect   EKG:  Done today and reviewed by myself shows SR, PVCs.  Sinus rate is 64, PVCs gives her 84 by the machine. No notable changes from old EKGs , no acute or ischemic looking changes.   10/31/18: Lexiscan stress test Notes recorded by Sharon Spark, MD on 10/29/2018 at 5:25 PM EST  1. EF 59%, normal wall motion.  2. No evidence for ischemia or infarction by perfusion images.    09/14/18: TTE Study Conclusions - Left ventricle: The cavity size was normal. Wall thickness  was   normal. Systolic function was normal. The estimated ejection   fraction was in the range of 55% to 60%. Wall motion was normal;   there were no regional wall motion abnormalities. Doppler   parameters are consistent with abnormal left ventricular   relaxation (grade 1 diastolic dysfunction). - Mitral valve: Systolic bowing without prolapse.  Impressions: - Normal LV systolic function; mild diastolic dysfunction    Recent Labs: 10/08/2018: Creatinine, Ser 0.87; Potassium 4.9 06/08/2019: ALT 27; BUN 20; Hemoglobin 14.4; Platelets 209; Sodium 143  No results found for  requested labs within last 8760 hours.   CrCl cannot be calculated ([name] most recent lab result is older than the maximum 21 days allowed.).   Wt Readings from Last 3 Encounters:  02/24/19 133 lb (60.3 kg)  01/28/19 134 lb (60.8 kg)  01/06/19 133 lb (60.3 kg)     Other studies reviewed: Additional studies/records reviewed today include: summarized above  ASSESSMENT AND PLAN:  1. PVCs     quadrigeminy today      2. Chest discomfort      a new complaint in the last few months     Exertional, none at rest and described above     Negative stress test Dec 2019, normal LVEF, no WMA by her echo Oct 2019  I discussed with Dr. Johnsie Cancel DOD today.  Recommend prescribing NTG sll, if this resolves her pain she will need cath, and to have her f/u with Dr. Meda Harrell.  I discussed how to use the s/l NTG and precautions with the patient Discussed if any escalation in her symptoms to get seen/evaluated    Disposition: as above, I will forward my note to Dr. Meda Harrell to make her aware and her recommendations as well.  Current medicines are reviewed at length with the patient today.  The patient did not have any concerns regarding medicines.  Venetia Night, PA-C 07/20/2019 11:37 AM     [facility]  [place] [id number] [phone] (office)  [phone] (fax)

## 2019-07-21 ENCOUNTER — Ambulatory Visit (INDEPENDENT_AMBULATORY_CARE_PROVIDER_SITE_OTHER): Payer: Medicare Other | Admitting: Physician Assistant

## 2019-07-21 ENCOUNTER — Encounter: Payer: Self-pay | Admitting: Physician Assistant

## 2019-07-21 ENCOUNTER — Other Ambulatory Visit: Payer: Self-pay

## 2019-07-21 VITALS — BP 124/74 | HR 85 | Ht 66.0 in | Wt 130.0 lb

## 2019-07-21 DIAGNOSIS — I493 Ventricular premature depolarization: Secondary | ICD-10-CM | POA: Diagnosis not present

## 2019-07-21 DIAGNOSIS — R079 Chest pain, unspecified: Secondary | ICD-10-CM | POA: Diagnosis not present

## 2019-07-21 MED ORDER — NITROGLYCERIN 0.4 MG SL SUBL: 0.4000 mg | SUBLINGUAL_TABLET | SUBLINGUAL | 3 refills | Status: DC | PRN

## 2019-07-21 NOTE — Patient Instructions (Signed)
Medication Instructions:   Stat tasking Nitroglycerin 0.4 sublingual as needed or chest pain   If you need a refill on your cardiac medications before your next appointment, please call your pharmacy.   Lab work: NONE ORDERED  TODAY   If you have labs (blood work) drawn today and your tests are completely normal, you will receive your results only by: Marland Kitchen MyChart Message (if you have MyChart) OR . A paper copy in the mail If you have any lab test that is abnormal or we need to change your treatment, we will call you to review the results.  Testing/Procedures:NONE ORDERED  TODAY   Follow-Up: At [facility], you and your health needs are our priority.  As part of our continuing mission to provide you with exceptional heart care, we have created designated Provider Care Teams.  These Care Teams include your primary Cardiologist (physician) and Advanced Practice Providers (APPs -  Physician Assistants and Nurse Practitioners) who all work together to provide you with the care you need, when you need it. You will need a follow up appointment in 2-3  weeks.  You may see Ena Dawley, MD or one of the following Advanced Practice Providers on your designated Care Team:   [place], PA-C Melina Copa, PA-C . Ermalinda Barrios, PA-C  Any Other Special Instructions Will Be Listed Below (If Applicable).

## 2019-07-26 ENCOUNTER — Encounter: Payer: Self-pay | Admitting: Family Medicine

## 2019-08-22 ENCOUNTER — Encounter: Payer: Self-pay | Admitting: Physician Assistant

## 2019-08-22 NOTE — Progress Notes (Addendum)
Cardiology Office Note    Date:  08/24/2019   ID:  Sharon Harrell, DOB [birth year], MRN [id number]  PCP:  Briscoe Deutscher, DO  Cardiologist:  Ena Dawley, MD  Electrophysiologist:  Cristopher Peru, MD   Chief Complaint: chest discomfort  History of Present Illness:   Sharon Harrell is a 68 y.o. female with history of hypothyroidism, frequent PVCs, hyperlipidemia (followed by primary care), cervical DDD, osteoporosis who presents for follow-up of chest discomfort. She established cardiology care in 2019 for longstanding palpitations. 2D echo 08/2018 showed EF 55-60%, grade 1 DD, MV systolic bowing without prolapse. Holter monitor showed 30k PVCs (14%), sinus bradycardia to sinus tachycardia, average HR 76bpm. She was referred to EP who felt the PVCs were polymorphic but with one dominant morphology. Dr. Lovena Le recommended continuation of beta blocker and watchful waiting. Coronary CTA was planned but per patient it was cancelled due to frequent PVCs as they may degrade image quality. In 10/2018 she had a nuclear stress test which was normal. She recently saw EP APP and indicated metoprolol had not made a significant difference in symptoms. She was in quadrigeminy at that visit. She was also reporting new exertional chest discomfort as well so SL NTG was prescribed and general cardiology f/u was recommended. Last labs 05/2019 with normal Hgb, LFTs, abbreviated BMET (no K or Cr), 09/2018 K 4.9, Cr 0.87, 02/2018 TSH normal, Mg 2.0, LDL 149.   She returns for follow-up today of chest discomfort. She has been very active her whole life, walking regularly, but reports for the last several months she has had band-like discomfort that occurs upon exertion. She has noticed she's had to lag behind her husband going up a hill, for example. This resolves with rest. It is associated with dyspnea. She has also noticed dyspnea with general housework. She indicates that it is not something that is acutely concerning  while happening but definitely an awareness that something is different. She has not taken SL NTG since symptoms resolve quickly with rest. She is not reporting any rest angina. She does not feel like she is overtly having an increased PVC burden with activity; these seem to be 2 different sensations. Her PVCs originally felt like a sensation of dyspnea with recumbency but now essentially occur around more regularly. Her brother died at 25 of a stroke and had some sort of heart issue; details unknown. Her father died at 30 in a car wreck and she doesn't otherwise have a lot of information on family history of CAD.   Past Medical History:  Diagnosis Date   Cataract    DDD (degenerative disc disease), cervical 2006   Depression 1/06   resolved   Diverticulitis    Dysphonia    Frequent PVCs    Hyperlipidemia    Hypothyroidism    Laryngitis from reflux of stomach acid    dietary modifications, Omeprazole, Zantac [facility]   Nocturnal leg cramps 06/06/2014   Non-seasonal allergic rhinitis    Osteoporosis 2017   Post-nasal drip    Flonase, [place] [facility]   SUI (stress urinary incontinence, female) 12/07   Vitamin D deficiency disease 11/07    Past Surgical History:  Procedure Laterality Date   BREAST EXCISIONAL BIOPSY Right 1991   benign   BREAST SURGERY     COMBINED HYSTERECTOMY VAGINAL W/ MMK / A&P REPAIR  1983   HAND SURGERY Left 10/2009   S-L    LIPOSUCTION     TONSILLECTOMY  as child  WISDOM TOOTH EXTRACTION      Current Medications: Current Meds  Medication Sig   alendronate (FOSAMAX) 70 MG tablet TAKE 1 TABLET BY MOUTH EVERY 7 DAYS. TAKE WITH A FULL GLASS OF WATER ON AN EMPTY STOMACH.   Cholecalciferol (VITAMIN D-3) 5000 UNITS TABS Take 1 tablet by mouth daily.   [employer] MAGNESIUM CITRATE PO Take 250 mg by mouth daily.    Famotidine (PEPCID PO) Take by mouth daily as needed.   fish oil-omega-3 fatty acids 1000 MG capsule Take 1 g by  mouth daily.     fluticasone (FLONASE) 50 MCG/ACT nasal spray Place 1 spray into both nostrils daily as needed.    levothyroxine (SYNTHROID) 75 MCG tablet TAKE 1 TABLET BY MOUTH EVERY DAY   metroNIDAZOLE (METROCREAM) 0.75 % cream Apply 1 application topically daily as needed.   MOMETASONE FUROATE EX Apply topically daily as needed.   Multiple Vitamin (MULTIVITAMIN) tablet Take 1 tablet by mouth daily.     mupirocin ointment (BACTROBAN) 2 % Place 1 application into the nose 2 (two) times daily.   nitroGLYCERIN (NITROSTAT) 0.4 MG SL tablet Place 1 tablet (0.4 mg total) under the tongue every 5 (five) minutes as needed for chest pain.   olopatadine (PATANOL) 0.1 % ophthalmic solution Place 1 drop into both eyes 2 (two) times daily.   Potassium Gluconate 595 MG CAPS Take 595 mg by mouth daily.   [DISCONTINUED] metoprolol succinate (TOPROL-XL) 25 MG 24 hr tablet Take 1 tablet (25 mg total) by mouth daily.     Allergies:   Patient has no known allergies.   Social History   Socioeconomic History   Marital status: Married    Spouse name: Not on file   Number of children: 2   Years of education: college-1   Highest education level: Not on file  Occupational History    Employer: [employer].    Comment: [place] Needs   Financial resource strain: Not on file   Food insecurity    Worry: Not on file    Inability: Not on file   Transportation needs    Medical: Not on file    Non-medical: Not on file  Tobacco Use   Smoking status: Former Smoker    Packs/day: 0.25    Years: 8.00    Pack years: 2.00    Quit date: 11/19/1971    Years since quitting: 47.7   Smokeless tobacco: Never Used  Substance and Sexual Activity   Alcohol use: Yes    Comment: socially   Drug use: No   Sexual activity: Not Currently    Partners: Male    Birth control/protection: Surgical    Comment: hysterectomy  Lifestyle   Physical activity    Days per week: Not on  file    Minutes per session: Not on file   Stress: Not on file  Relationships   Social connections    Talks on phone: Not on file    Gets together: Not on file    Attends religious service: Not on file    Active member of club or organization: Not on file    Attends meetings of clubs or organizations: Not on file    Relationship status: Not on file  Other Topics Concern   Not on file  Social History Narrative   Not on file     Family History:  The patient's family history includes Breast cancer (age of onset: 47) in her mother; Cancer in her  mother; Dementia in her mother; Endometriosis in her sister; Heart disease in her brother; Osteoarthritis in her mother; Stroke in her brother and maternal grandmother.  ROS:   Please see the history of present illness. Review of systems is positive for discoloration behind ankle bones -appears insignificant shadowing due to vascular bed. Also notes some hair loss, staying cold, and 2-3lb weight loss in last few weeks.  All other systems are reviewed and otherwise negative.    EKGs/Labs/Other Studies Reviewed:    Studies reviewed were summarized above.   EKG:  EKG is ordered today, personally reviewed, demonstrating NSR 94bpm with frequent monomorphic PVCs in pattern of trigeminy (LBBB morphology) nonspecific TW changes, prolonged QT reported but does not appear prolonged in sinus beat to beat  Recent Labs: 10/08/2018: Creatinine, Ser 0.87; Potassium 4.9 06/08/2019: ALT 27; BUN 20; Hemoglobin 14.4; Platelets 209; Sodium 143  Recent Lipid Panel    Component Value Date/Time   CHOL 243 (H) 02/20/2018 0909   TRIG 85.0 02/20/2018 0909   HDL 76.60 02/20/2018 0909   CHOLHDL 3 02/20/2018 0909   VLDL 17.0 02/20/2018 0909   LDLCALC 149 (H) 02/20/2018 0909    PHYSICAL EXAM:    VS:  BP 122/68    Pulse 68    Ht 5\' 6"  (1.676 m)    Wt 129 lb 1.9 oz (58.6 kg)    LMP 08/18/1982 (Approximate)    SpO2 97%    BMI 20.84 kg/m   BMI: Body mass index  is 20.84 kg/m.  GEN: Well nourished, well developed WF, in no acute distress HEENT: normocephalic, atraumatic Neck: no JVD or masses Cardiac: Regularly irregular rhythm in a pattern of trigeminy; no murmurs, rubs, or gallops, no edema  Respiratory:  clear to auscultation bilaterally, normal work of breathing GI: soft, nontender, nondistended, + BS MS: no deformity or atrophy Skin: warm and dry, no rash Neuro:  Alert and Oriented x 3, Strength and sensation are intact, follows commands Psych: euthymic mood, full affect  Wt Readings from Last 3 Encounters:  08/24/19 129 lb 1.9 oz (58.6 kg)  07/21/19 130 lb (59 kg)  02/24/19 133 lb (60.3 kg)     ASSESSMENT & PLAN:   1. Chest discomfort - concerning for angina, but also unclear how much her PVCs are contributing. Given her PVC burden and possible angina pectoris, ischemic workup is indicated. Will also update BMET/Mg for electrolytes, CBC, and thyroid function to assess for any metabolic abnormalities contributing to her symptoms. Coronary CTA would not be ideal given her frequent PVCs. D/w Dr. Meda Coffee and we would recommend proceeding with left heart catheterization to evaluate for CAD. Risks and benefits of cardiac catheterization have been discussed with the patient. These include bleeding, infection, kidney damage, stroke, heart attack, death. The patient understands these risks and is willing to proceed. Warning sx reviewed. Will also start a baby aspirin in the interim.  2. Frequent PVCs - metoprolol has not helped. Will d/c metoprolol and start acebutolol 200mg  BID specifically for suppression of PVC burden. If ischemic workup is unremarkable, would suggest cMRI to evaluate for infiltrative disease or scarring. If PVCs unresolved with 2nd BB, could consider trial of 3rd BB (such as atenolol or propranolol) or consider discussion with EP for antiarrhythmic therapy. Catheterization will help assess her candidacy for which AAD would be  appropriate such as flecainide. 3. Hypothyroidism - check thyroid with labs today. 4. Hyperlipidemia - this is followed by primary care. If she is found to have coronary atherosclerosis,  statin therapy would be indicated. Baseline LFTs were normal in 05/2019.  Disposition: F/u with me in 3 weeks.  Medication Adjustments/Labs and Tests Ordered: Current medicines are reviewed at length with the patient today.  Concerns regarding medicines are outlined above. Medication changes, Labs and Tests ordered today are summarized above and listed in the Patient Instructions accessible in Encounters.   Signed, Charlie Pitter, PA-C  08/24/2019 9:35 AM    [facility] [place], [place], [place]  [address] Phone: [phone]; Fax: [phone]

## 2019-08-22 NOTE — H&P (View-Only) (Signed)
Cardiology Office Note    Date:  08/24/2019   ID:  EVYENIA Harrell, DOB Jun 19, 1951, MRN [id number]  PCP:  Briscoe Deutscher, DO  Cardiologist:  Ena Dawley, MD  Electrophysiologist:  Cristopher Peru, MD   Chief Complaint: chest discomfort  History of Present Illness:   Sharon Harrell is a 68 y.o. female with history of hypothyroidism, frequent PVCs, hyperlipidemia (followed by primary care), cervical DDD, osteoporosis who presents for follow-up of chest discomfort. She established cardiology care in 2019 for longstanding palpitations. 2D echo 08/2018 showed EF 55-60%, grade 1 DD, MV systolic bowing without prolapse. Holter monitor showed 30k PVCs (14%), sinus bradycardia to sinus tachycardia, average HR 76bpm. She was referred to EP who felt the PVCs were polymorphic but with one dominant morphology. Dr. Lovena Le recommended continuation of beta blocker and watchful waiting. Coronary CTA was planned but per patient it was cancelled due to frequent PVCs as they may degrade image quality. In 10/2018 she had a nuclear stress test which was normal. She recently saw EP APP and indicated metoprolol had not made a significant difference in symptoms. She was in quadrigeminy at that visit. She was also reporting new exertional chest discomfort as well so SL NTG was prescribed and general cardiology f/u was recommended. Last labs 05/2019 with normal Hgb, LFTs, abbreviated BMET (no K or Cr), 09/2018 K 4.9, Cr 0.87, 02/2018 TSH normal, Mg 2.0, LDL 149.   She returns for follow-up today of chest discomfort. She has been very active her whole life, walking regularly, but reports for the last several months she has had band-like discomfort that occurs upon exertion. She has noticed she's had to lag behind her husband going up a hill, for example. This resolves with rest. It is associated with dyspnea. She has also noticed dyspnea with general housework. She indicates that it is not something that is acutely concerning  while happening but definitely an awareness that something is different. She has not taken SL NTG since symptoms resolve quickly with rest. She is not reporting any rest angina. She does not feel like she is overtly having an increased PVC burden with activity; these seem to be 2 different sensations. Her PVCs originally felt like a sensation of dyspnea with recumbency but now essentially occur around more regularly. Her brother died at 70 of a stroke and had some sort of heart issue; details unknown. Her father died at 29 in a car wreck and she doesn't otherwise have a lot of information on family history of CAD.   Past Medical History:  Diagnosis Date   Cataract    DDD (degenerative disc disease), cervical 2006   Depression 1/06   resolved   Diverticulitis    Dysphonia    Frequent PVCs    Hyperlipidemia    Hypothyroidism    Laryngitis from reflux of stomach acid    dietary modifications, Omeprazole, Zantac [facility]   Nocturnal leg cramps 06/06/2014   Non-seasonal allergic rhinitis    Osteoporosis 2017   Post-nasal drip    Flonase, [place] [facility]   SUI (stress urinary incontinence, female) 12/07   Vitamin D deficiency disease 11/07    Past Surgical History:  Procedure Laterality Date   BREAST EXCISIONAL BIOPSY Right 1991   benign   BREAST SURGERY     COMBINED HYSTERECTOMY VAGINAL W/ MMK / A&P REPAIR  1983   HAND SURGERY Left 10/2009   S-L    LIPOSUCTION     TONSILLECTOMY  as child  WISDOM TOOTH EXTRACTION      Current Medications: Current Meds  Medication Sig   alendronate (FOSAMAX) 70 MG tablet TAKE 1 TABLET BY MOUTH EVERY 7 DAYS. TAKE WITH A FULL GLASS OF WATER ON AN EMPTY STOMACH.   Cholecalciferol (VITAMIN D-3) 5000 UNITS TABS Take 1 tablet by mouth daily.   CVS MAGNESIUM CITRATE PO Take 250 mg by mouth daily.    Famotidine (PEPCID PO) Take by mouth daily as needed.   fish oil-omega-3 fatty acids 1000 MG capsule Take 1 g by  mouth daily.     fluticasone (FLONASE) 50 MCG/ACT nasal spray Place 1 spray into both nostrils daily as needed.    levothyroxine (SYNTHROID) 75 MCG tablet TAKE 1 TABLET BY MOUTH EVERY DAY   metroNIDAZOLE (METROCREAM) 0.75 % cream Apply 1 application topically daily as needed.   MOMETASONE FUROATE EX Apply topically daily as needed.   Multiple Vitamin (MULTIVITAMIN) tablet Take 1 tablet by mouth daily.     mupirocin ointment (BACTROBAN) 2 % Place 1 application into the nose 2 (two) times daily.   nitroGLYCERIN (NITROSTAT) 0.4 MG SL tablet Place 1 tablet (0.4 mg total) under the tongue every 5 (five) minutes as needed for chest pain.   olopatadine (PATANOL) 0.1 % ophthalmic solution Place 1 drop into both eyes 2 (two) times daily.   Potassium Gluconate 595 MG CAPS Take 595 mg by mouth daily.   [DISCONTINUED] metoprolol succinate (TOPROL-XL) 25 MG 24 hr tablet Take 1 tablet (25 mg total) by mouth daily.     Allergies:   Patient has no known allergies.   Social History   Socioeconomic History   Marital status: Married    Spouse name: Not on file   Number of children: 2   Years of education: college-1   Highest education level: Not on file  Occupational History    Employer: [employer].    Comment: [place] Needs   Financial resource strain: Not on file   Food insecurity    Worry: Not on file    Inability: Not on file   Transportation needs    Medical: Not on file    Non-medical: Not on file  Tobacco Use   Smoking status: Former Smoker    Packs/day: 0.25    Years: 8.00    Pack years: 2.00    Quit date: 11/19/1971    Years since quitting: 47.7   Smokeless tobacco: Never Used  Substance and Sexual Activity   Alcohol use: Yes    Comment: socially   Drug use: No   Sexual activity: Not Currently    Partners: Male    Birth control/protection: Surgical    Comment: hysterectomy  Lifestyle   Physical activity    Days per week: Not on  file    Minutes per session: Not on file   Stress: Not on file  Relationships   Social connections    Talks on phone: Not on file    Gets together: Not on file    Attends religious service: Not on file    Active member of club or organization: Not on file    Attends meetings of clubs or organizations: Not on file    Relationship status: Not on file  Other Topics Concern   Not on file  Social History Narrative   Not on file     Family History:  The patient's family history includes Breast cancer (age of onset: 50) in her mother; Cancer in her  mother; Dementia in her mother; Endometriosis in her sister; Heart disease in her brother; Osteoarthritis in her mother; Stroke in her brother and maternal grandmother.  ROS:   Please see the history of present illness. Review of systems is positive for discoloration behind ankle bones -appears insignificant shadowing due to vascular bed. Also notes some hair loss, staying cold, and 2-3lb weight loss in last few weeks.  All other systems are reviewed and otherwise negative.    EKGs/Labs/Other Studies Reviewed:    Studies reviewed were summarized above.   EKG:  EKG is ordered today, personally reviewed, demonstrating NSR 94bpm with frequent monomorphic PVCs in pattern of trigeminy (LBBB morphology) nonspecific TW changes, prolonged QT reported but does not appear prolonged in sinus beat to beat  Recent Labs: 10/08/2018: Creatinine, Ser 0.87; Potassium 4.9 06/08/2019: ALT 27; BUN 20; Hemoglobin 14.4; Platelets 209; Sodium 143  Recent Lipid Panel    Component Value Date/Time   CHOL 243 (H) 02/20/2018 0909   TRIG 85.0 02/20/2018 0909   HDL 76.60 02/20/2018 0909   CHOLHDL 3 02/20/2018 0909   VLDL 17.0 02/20/2018 0909   LDLCALC 149 (H) 02/20/2018 0909    PHYSICAL EXAM:    VS:  BP 122/68    Pulse 68    Ht 5\' 6"  (1.676 m)    Wt 129 lb 1.9 oz (58.6 kg)    LMP 08/18/1982 (Approximate)    SpO2 97%    BMI 20.84 kg/m   BMI: Body mass index  is 20.84 kg/m.  GEN: Well nourished, well developed WF, in no acute distress HEENT: normocephalic, atraumatic Neck: no JVD or masses Cardiac: Regularly irregular rhythm in a pattern of trigeminy; no murmurs, rubs, or gallops, no edema  Respiratory:  clear to auscultation bilaterally, normal work of breathing GI: soft, nontender, nondistended, + BS MS: no deformity or atrophy Skin: warm and dry, no rash Neuro:  Alert and Oriented x 3, Strength and sensation are intact, follows commands Psych: euthymic mood, full affect  Wt Readings from Last 3 Encounters:  08/24/19 129 lb 1.9 oz (58.6 kg)  07/21/19 130 lb (59 kg)  02/24/19 133 lb (60.3 kg)     ASSESSMENT & PLAN:   1. Chest discomfort - concerning for angina, but also unclear how much her PVCs are contributing. Given her PVC burden and possible angina pectoris, ischemic workup is indicated. Will also update BMET/Mg for electrolytes, CBC, and thyroid function to assess for any metabolic abnormalities contributing to her symptoms. Coronary CTA would not be ideal given her frequent PVCs. D/w Dr. Meda Coffee and we would recommend proceeding with left heart catheterization to evaluate for CAD. Risks and benefits of cardiac catheterization have been discussed with the patient. These include bleeding, infection, kidney damage, stroke, heart attack, death. The patient understands these risks and is willing to proceed. Warning sx reviewed. Will also start a baby aspirin in the interim.  2. Frequent PVCs - metoprolol has not helped. Will d/c metoprolol and start acebutolol 200mg  BID specifically for suppression of PVC burden. If ischemic workup is unremarkable, would suggest cMRI to evaluate for infiltrative disease or scarring. If PVCs unresolved with 2nd BB, could consider trial of 3rd BB (such as atenolol or propranolol) or consider discussion with EP for antiarrhythmic therapy. Catheterization will help assess her candidacy for which AAD would be  appropriate such as flecainide. 3. Hypothyroidism - check thyroid with labs today. 4. Hyperlipidemia - this is followed by primary care. If she is found to have coronary atherosclerosis,  statin therapy would be indicated. Baseline LFTs were normal in 05/2019.  Disposition: F/u with me in 3 weeks.  Medication Adjustments/Labs and Tests Ordered: Current medicines are reviewed at length with the patient today.  Concerns regarding medicines are outlined above. Medication changes, Labs and Tests ordered today are summarized above and listed in the Patient Instructions accessible in Encounters.   Signed, Charlie Pitter, PA-C  08/24/2019 9:35 AM    [facility] [place], [place], [place]  [address] Phone: [phone]; Fax: [phone]

## 2019-08-24 ENCOUNTER — Telehealth: Payer: Self-pay | Admitting: Family Medicine

## 2019-08-24 ENCOUNTER — Other Ambulatory Visit: Payer: Self-pay

## 2019-08-24 ENCOUNTER — Ambulatory Visit (INDEPENDENT_AMBULATORY_CARE_PROVIDER_SITE_OTHER): Payer: Medicare Other | Admitting: Physician Assistant

## 2019-08-24 ENCOUNTER — Encounter: Payer: Self-pay | Admitting: Physician Assistant

## 2019-08-24 VITALS — BP 122/68 | HR 68 | Ht 66.0 in | Wt 129.1 lb

## 2019-08-24 DIAGNOSIS — E039 Hypothyroidism, unspecified: Secondary | ICD-10-CM

## 2019-08-24 DIAGNOSIS — E785 Hyperlipidemia, unspecified: Secondary | ICD-10-CM | POA: Diagnosis not present

## 2019-08-24 DIAGNOSIS — R0789 Other chest pain: Secondary | ICD-10-CM | POA: Diagnosis not present

## 2019-08-24 DIAGNOSIS — I493 Ventricular premature depolarization: Secondary | ICD-10-CM | POA: Diagnosis not present

## 2019-08-24 LAB — T4, FREE: Free T4: 1.64 ng/dL (ref 0.82–1.77)

## 2019-08-24 LAB — BASIC METABOLIC PANEL
BUN/Creatinine Ratio: 19 (ref 12–28)
BUN: 16 mg/dL (ref 8–27)
CO2: 25 mmol/L (ref 20–29)
Calcium: 9.9 mg/dL (ref 8.7–10.3)
Chloride: 104 mmol/L (ref 96–106)
Creatinine, Ser: 0.85 mg/dL (ref 0.57–1.00)
GFR calc Af Amer: 82 mL/min/{1.73_m2} (ref >59)
GFR calc non Af Amer: 71 mL/min/{1.73_m2} (ref >59)
Glucose: 91 mg/dL (ref 65–99)
Potassium: 5 mmol/L (ref 3.5–5.2)
Sodium: 140 mmol/L (ref 134–144)

## 2019-08-24 LAB — CBC
Hematocrit: 42.6 % (ref 34.0–46.6)
Hemoglobin: 14.7 g/dL (ref 11.1–15.9)
MCH: 30.6 pg (ref 26.6–33.0)
MCHC: 34.5 g/dL (ref 31.5–35.7)
MCV: 89 fL (ref 79–97)
Platelets: 248 10*3/uL (ref 150–450)
RBC: 4.8 x10E6/uL (ref 3.77–5.28)
RDW: 12.5 % (ref 11.7–15.4)
WBC: 4.9 10*3/uL (ref 3.4–10.8)

## 2019-08-24 LAB — MAGNESIUM: Magnesium: 2.2 mg/dL (ref 1.6–2.3)

## 2019-08-24 LAB — TSH: TSH: 1.07 u[IU]/mL (ref 0.450–4.500)

## 2019-08-24 MED ORDER — ACEBUTOLOL HCL 200 MG PO CAPS: 200.0000 mg | ORAL_CAPSULE | Freq: Two times a day (BID) | ORAL | 3 refills | Status: DC

## 2019-08-24 MED ORDER — ASPIRIN EC 81 MG PO TBEC: 81.0000 mg | DELAYED_RELEASE_TABLET | Freq: Every day | ORAL | 3 refills | Status: DC

## 2019-08-24 NOTE — Patient Instructions (Addendum)
Medication Instructions:  Your physician has recommended you make the following change in your medication:  1.  STOP the Metoprolol 2.  START Acebutolol 200 mg taking 1 tablet twice a day 3.  START Aspirin 81 mg taking 1 daily  If you need a refill on your cardiac medications before your next appointment, please call your pharmacy.   Lab work: TODAY:  BMET, CBC, MAG, TSH, & FREE T4  If you have labs (blood work) drawn today and your tests are completely normal, you will receive your results only by: Marland Kitchen MyChart Message (if you have MyChart) OR . A paper copy in the mail If you have any lab test that is abnormal or we need to change your treatment, we will call you to review the results.  Testing/Procedures: Your physician has requested that you have a cardiac catheterization. Cardiac catheterization is used to diagnose and/or treat various heart conditions. Doctors may recommend this procedure for a number of different reasons. The most common reason is to evaluate chest pain. Chest pain can be a symptom of coronary artery disease (CAD), and cardiac catheterization can show whether plaque is narrowing or blocking your heart's arteries. This procedure is also used to evaluate the valves, as well as measure the blood flow and oxygen levels in different parts of your heart. For further information please visit [facility]. Please follow instruction sheet, BELOW:     [facility] OFFICE [place], [place] [address] Dept: [phone] Loc: [facility] How  08/24/2019  You are scheduled for a Cardiac Catheterization on Tuesday, October 13 with Dr. Peter Martinique.  1. Please arrive at the [facility] (Main Entrance A) at [facility]: [address] [place], [place] [address] at 5:30 AM (This time is two hours before your procedure to ensure your preparation). Free valet  parking service is available.   Special note: Every effort is made to have your procedure done on time. Please understand that emergencies sometimes delay scheduled procedures.  2. Diet: Do not eat solid foods after midnight.  The patient may have clear liquids until 5am upon the day of the procedure.  3. Labs: We will draw your lab work today, in the office.  You will need to have a covid test done for this procedure. [facility] Friday, 08/27/2019 at 11:15.. Once you are tested, you will be asked to go home to quarantine.    4. Medication instructions in preparation for your procedure:   Contrast Allergy: No  On the morning of your procedure, take your Aspirin and any morning medicines NOT listed above.  You may use sips of water.  5. Plan for one night stay--bring personal belongings. 6. Bring a current list of your medications and current insurance cards. 7. You MUST have a responsible person to drive you home. 8. Someone MUST be with you the first 24 hours after you arrive home or your discharge will be delayed. 9. Please wear clothes that are easy to get on and off and wear slip-on shoes.  Thank you for allowing [place] to care for you!   -- [facility]    Follow-Up: At [facility], you and your health needs are our priority.  As part of our continuing mission to provide you with exceptional heart care, we have created designated Provider Care Teams.  These Care Teams include your primary Cardiologist (physician) and Advanced Practice Providers (APPs -  Physician Assistants and Nurse Practitioners) who all work together to provide you with the care you need, when you need it. . Please arrive at 8:45 Tuesday, 09/14/2019 to see Melina Copa, PA-C  Any Other Special Instructions Will Be Listed Below (If Applicable).   Coronary Angiogram With Stent Coronary angiogram with stent placement is a  procedure to widen or open a narrow blood vessel of the heart (coronary artery). Arteries may become blocked by cholesterol buildup (plaques) in the lining of the wall. When a coronary artery becomes partially blocked, blood flow to that area decreases. This may lead to chest pain or a heart attack (myocardial infarction). A stent is a small piece of metal that looks like mesh or a spring. Stent placement may be done as treatment for a heart attack or right after a coronary angiogram in which a blocked artery is found. Let your health care provider know about:  Any allergies you have.  All medicines you are taking, including vitamins, herbs, eye drops, creams, and over-the-counter medicines.  Any problems you or family members have had with anesthetic medicines.  Any blood disorders you have.  Any surgeries you have had.  Any medical conditions you have.  Whether you are pregnant or may be pregnant. What are the risks? Generally, this is a safe procedure. However, problems may occur, including:  Damage to the heart or its blood vessels.  A return of blockage.  Bleeding, infection, or bruising at the insertion site.  A collection of blood under the skin (hematoma) at the insertion site.  A blood clot in another part of the body.  Kidney injury.  Allergic reaction to the dye or contrast that is used.  Bleeding into the abdomen (retroperitoneal bleeding). What happens before the procedure? Staying hydrated Follow instructions from your health care provider about hydration, which may include:  Up to 2 hours before the procedure - you may continue to drink clear liquids, such as water, clear fruit juice, black coffee, and plain tea.  Eating and drinking restrictions Follow instructions from your health care provider about eating and drinking, which may include:  8 hours before the procedure - stop eating heavy meals or foods such as meat, fried foods, or fatty foods.  6 hours  before the procedure - stop eating light meals or foods, such as toast or cereal.  2 hours before the procedure - stop drinking clear liquids. Ask your health care provider about:  Changing or stopping your regular medicines. This is especially important if you are taking diabetes medicines or blood thinners.  Taking medicines such as ibuprofen. These medicines can thin your blood. Do not take these medicines before your procedure if your health care provider instructs you not to. Generally, aspirin is recommended before a procedure of passing a small, thin tube (catheter) through a blood vessel and into the heart (cardiac catheterization). What happens during the procedure?   An IV tube will be inserted into one of your veins.  You will be given one or more of the following: ? A medicine to help you relax (sedative). ? A medicine to numb the area where the catheter will be inserted into an artery (local anesthetic).  To reduce your risk of infection: ? Your health care team will wash or sanitize their hands. ? Your skin will be washed with soap. ? Hair may be removed from the area where the catheter  will be inserted.  Using a guide wire, the catheter will be inserted into an artery. The location may be in your groin, in your wrist, or in the fold of your arm (near your elbow).  A type of X-ray (fluoroscopy) will be used to help guide the catheter to the opening of the arteries in the heart.  A dye will be injected into the catheter, and X-rays will be taken. The dye will help to show where any narrowing or blockages are located in the arteries.  A tiny wire will be guided to the blocked spot, and a balloon will be inflated to make the artery wider.  The stent will be expanded and will crush the plaques into the wall of the vessel. The stent will hold the area open and improve the blood flow. Most stents have a drug coating to reduce the risk of the stent narrowing over time.  The  artery may be made wider using a drill, laser, or other tools to remove plaques.  When the blood flow is better, the catheter will be removed. The lining of the artery will grow over the stent, which stays where it was placed. This procedure may vary among health care providers and hospitals. What happens after the procedure?  If the procedure is done through the leg, you will be kept in bed lying flat for about 6 hours. You will be instructed to not bend and not cross your legs.  The insertion site will be checked frequently.  The pulse in your foot or wrist will be checked frequently.  You may have additional blood tests, X-rays, and a test that records the electrical activity of your heart (electrocardiogram, or ECG). This information is not intended to replace advice given to you by your health care provider. Make sure you discuss any questions you have with your health care provider. Document Released: 05/11/2003 Document Revised: 02/13/2018 Document Reviewed: 06/09/2016 Elsevier Patient Education  2020 [employer].

## 2019-08-25 ENCOUNTER — Telehealth: Payer: Self-pay

## 2019-08-25 NOTE — Telephone Encounter (Signed)
-----   Message from Charlie Pitter, [place] sent at 08/24/2019  5:52 PM EDT ----- Anderson Malta is off tomorrow. Please let pt know labs came back normal except potassium is upper limits of normal. Looks like she takes an OTC supplement - this is only a small amount of potassium but would stop for now. Continue plan as discussed. Dayna Dunn PA-C

## 2019-08-25 NOTE — Telephone Encounter (Signed)
Notes recorded by Frederik Schmidt, RN on 08/25/2019 at 8:24 AM EDT  The patient has been notified of the result and verbalized understanding. All questions (if any) were answered.  Frederik Schmidt, RN 08/25/2019 8:24 AM

## 2019-08-25 NOTE — Telephone Encounter (Signed)
error 

## 2019-08-26 ENCOUNTER — Other Ambulatory Visit: Payer: Self-pay | Admitting: Physician Assistant

## 2019-08-26 MED ORDER — SODIUM CHLORIDE 0.9% FLUSH: 3.0000 mL | Freq: Two times a day (BID) | INTRAVENOUS | Status: DC

## 2019-08-27 ENCOUNTER — Other Ambulatory Visit (HOSPITAL_COMMUNITY)
Admission: RE | Admit: 2019-08-27 | Discharge: 2019-08-27 | Disposition: A | Discharge status: Home / Self Care | Payer: Medicare Other | Source: Ambulatory Visit | Attending: Cardiology | Admitting: Cardiology

## 2019-08-27 DIAGNOSIS — Z01812 Encounter for preprocedural laboratory examination: Secondary | ICD-10-CM | POA: Insufficient documentation

## 2019-08-27 DIAGNOSIS — Z20828 Contact with and (suspected) exposure to other viral communicable diseases: Secondary | ICD-10-CM | POA: Diagnosis not present

## 2019-08-30 ENCOUNTER — Telehealth: Payer: Self-pay | Admitting: *Deleted

## 2019-08-30 ENCOUNTER — Encounter: Payer: Medicare Other | Admitting: Family Medicine

## 2019-08-30 LAB — NOVEL CORONAVIRUS, NAA (HOSP ORDER, SEND-OUT TO REF LAB; TAT 18-24 HRS): SARS-CoV-2, NAA: NOT DETECTED

## 2019-08-30 NOTE — Telephone Encounter (Addendum)
Pt contacted pre-catheterization scheduled at [facility] for: Tuesday August 31, 2019 7:30 AM Verified arrival time and place: [facility]) at: 5:30 AM   No solid food after midnight prior to cath, clear liquids until 5 AM day of procedure. Contrast allergy: no   AM meds can be  taken pre-cath with sip of water including: ASA 81 mg   Confirmed patient has responsible adult to drive home post procedure and observe 24 hours after arriving home: yes  Currently, due to Covid-19 pandemic, only one support person will be allowed with patient. Must be the same support person for that patient's entire stay, will be screened and required to wear a mask. They will be asked to wait in the waiting room for the duration of the patient's stay.  Patients are required to wear a mask when they enter the hospital.      COVID-19 Pre-Screening Questions:  . In the past 7 to 10 days have you had a cough,  shortness of breath, headache, congestion, fever (100 or greater) body aches, chills, sore throat, or sudden loss of taste or sense of smell? no . Have you been around anyone with known Covid 19? no . Have you been around anyone who is awaiting Covid 19 test results in the past 7 to 10 days? no . Have you been around anyone who has been exposed to Covid 19, or has mentioned symptoms of Covid 19 within the past 7 to 10 days? no    I reviewed procedure/mask/visitor, Covid-19 screening questions with patient, she verbalized understanding, thanked me for call.

## 2019-08-31 ENCOUNTER — Other Ambulatory Visit: Payer: Self-pay

## 2019-08-31 ENCOUNTER — Encounter (HOSPITAL_COMMUNITY)
Admission: RE | Disposition: A | Discharge status: Home / Self Care | Payer: Self-pay | Source: Home / Self Care | Attending: Cardiology

## 2019-08-31 ENCOUNTER — Encounter (HOSPITAL_COMMUNITY): Payer: Self-pay | Admitting: Cardiology

## 2019-08-31 ENCOUNTER — Ambulatory Visit (HOSPITAL_COMMUNITY)
Admission: RE | Admit: 2019-08-31 | Discharge: 2019-08-31 | Disposition: A | Discharge status: Home / Self Care | Payer: Medicare Other | Attending: Cardiology | Admitting: Cardiology

## 2019-08-31 ENCOUNTER — Ambulatory Visit (HOSPITAL_COMMUNITY): Payer: Medicare Other

## 2019-08-31 DIAGNOSIS — E039 Hypothyroidism, unspecified: Secondary | ICD-10-CM | POA: Insufficient documentation

## 2019-08-31 DIAGNOSIS — M25472 Effusion, left ankle: Secondary | ICD-10-CM

## 2019-08-31 DIAGNOSIS — M79672 Pain in left foot: Secondary | ICD-10-CM | POA: Diagnosis not present

## 2019-08-31 DIAGNOSIS — Z87891 Personal history of nicotine dependence: Secondary | ICD-10-CM | POA: Insufficient documentation

## 2019-08-31 DIAGNOSIS — M810 Age-related osteoporosis without current pathological fracture: Secondary | ICD-10-CM | POA: Diagnosis not present

## 2019-08-31 DIAGNOSIS — R079 Chest pain, unspecified: Secondary | ICD-10-CM | POA: Diagnosis present

## 2019-08-31 DIAGNOSIS — M7989 Other specified soft tissue disorders: Secondary | ICD-10-CM | POA: Diagnosis not present

## 2019-08-31 DIAGNOSIS — Z79890 Hormone replacement therapy (postmenopausal): Secondary | ICD-10-CM | POA: Insufficient documentation

## 2019-08-31 DIAGNOSIS — I493 Ventricular premature depolarization: Secondary | ICD-10-CM | POA: Diagnosis present

## 2019-08-31 DIAGNOSIS — H269 Unspecified cataract: Secondary | ICD-10-CM | POA: Diagnosis not present

## 2019-08-31 DIAGNOSIS — Z79899 Other long term (current) drug therapy: Secondary | ICD-10-CM | POA: Insufficient documentation

## 2019-08-31 DIAGNOSIS — Z8249 Family history of ischemic heart disease and other diseases of the circulatory system: Secondary | ICD-10-CM | POA: Insufficient documentation

## 2019-08-31 DIAGNOSIS — E785 Hyperlipidemia, unspecified: Secondary | ICD-10-CM | POA: Diagnosis not present

## 2019-08-31 DIAGNOSIS — S99922A Unspecified injury of left foot, initial encounter: Secondary | ICD-10-CM | POA: Diagnosis not present

## 2019-08-31 DIAGNOSIS — M19072 Primary osteoarthritis, left ankle and foot: Secondary | ICD-10-CM | POA: Insufficient documentation

## 2019-08-31 DIAGNOSIS — E782 Mixed hyperlipidemia: Secondary | ICD-10-CM | POA: Diagnosis present

## 2019-08-31 HISTORY — PX: LEFT HEART CATH AND CORONARY ANGIOGRAPHY: CATH118249

## 2019-08-31 SURGERY — LEFT HEART CATH AND CORONARY ANGIOGRAPHY
Anesthesia: LOCAL

## 2019-08-31 MED ORDER — SODIUM CHLORIDE 0.9% FLUSH: 3.0000 mL | INTRAVENOUS | Status: DC | PRN

## 2019-08-31 MED ORDER — SODIUM CHLORIDE 0.9 % WEIGHT BASED INFUSION
3.0000 mL/kg/h | INTRAVENOUS | Status: AC
  Administered 2019-08-31: 06:00:00 3 mL/kg/h via INTRAVENOUS

## 2019-08-31 MED ORDER — SODIUM CHLORIDE 0.9 % WEIGHT BASED INFUSION: 1.0000 mL/kg/h | INTRAVENOUS | Status: DC

## 2019-08-31 MED ORDER — IOHEXOL 350 MG/ML SOLN
INTRAVENOUS | Status: DC | PRN
  Administered 2019-08-31: 55 mL via INTRA_ARTERIAL

## 2019-08-31 MED ORDER — ONDANSETRON HCL 4 MG/2ML IJ SOLN: 4.0000 mg | Freq: Four times a day (QID) | INTRAMUSCULAR | Status: DC | PRN

## 2019-08-31 MED ORDER — MIDAZOLAM HCL 2 MG/2ML IJ SOLN
INTRAMUSCULAR | Status: AC
  Filled 2019-08-31: qty 2

## 2019-08-31 MED ORDER — ASPIRIN 81 MG PO CHEW: 81.0000 mg | CHEWABLE_TABLET | ORAL | Status: AC

## 2019-08-31 MED ORDER — HEPARIN (PORCINE) IN NACL 1000-0.9 UT/500ML-% IV SOLN
INTRAVENOUS | Status: AC
  Filled 2019-08-31: qty 500

## 2019-08-31 MED ORDER — HEPARIN (PORCINE) IN NACL 1000-0.9 UT/500ML-% IV SOLN
INTRAVENOUS | Status: DC | PRN
  Administered 2019-08-31: 500 mL

## 2019-08-31 MED ORDER — VERAPAMIL HCL 2.5 MG/ML IV SOLN
INTRAVENOUS | Status: AC
  Filled 2019-08-31: qty 2

## 2019-08-31 MED ORDER — LIDOCAINE HCL (PF) 1 % IJ SOLN
INTRAMUSCULAR | Status: AC
  Filled 2019-08-31: qty 30

## 2019-08-31 MED ORDER — MIDAZOLAM HCL 2 MG/2ML IJ SOLN
INTRAMUSCULAR | Status: DC | PRN
  Administered 2019-08-31: 1 mg via INTRAVENOUS

## 2019-08-31 MED ORDER — SODIUM CHLORIDE 0.9 % IV SOLN: 250.0000 mL | INTRAVENOUS | Status: DC | PRN

## 2019-08-31 MED ORDER — SODIUM CHLORIDE 0.9% FLUSH: 3.0000 mL | Freq: Two times a day (BID) | INTRAVENOUS | Status: DC

## 2019-08-31 MED ORDER — ACETAMINOPHEN 325 MG PO TABS: 650.0000 mg | ORAL_TABLET | ORAL | Status: DC | PRN

## 2019-08-31 MED ORDER — VERAPAMIL HCL 2.5 MG/ML IV SOLN
INTRAVENOUS | Status: DC | PRN
  Administered 2019-08-31: 10 mL via INTRA_ARTERIAL

## 2019-08-31 MED ORDER — FENTANYL CITRATE (PF) 100 MCG/2ML IJ SOLN
INTRAMUSCULAR | Status: DC | PRN
  Administered 2019-08-31: 25 ug via INTRAVENOUS

## 2019-08-31 MED ORDER — HEPARIN SODIUM (PORCINE) 1000 UNIT/ML IJ SOLN
INTRAMUSCULAR | Status: DC | PRN
  Administered 2019-08-31: 3000 [IU] via INTRAVENOUS

## 2019-08-31 MED ORDER — FENTANYL CITRATE (PF) 100 MCG/2ML IJ SOLN
INTRAMUSCULAR | Status: AC
  Filled 2019-08-31: qty 2

## 2019-08-31 MED ORDER — LIDOCAINE HCL (PF) 1 % IJ SOLN
INTRAMUSCULAR | Status: DC | PRN
  Administered 2019-08-31: 2 mL

## 2019-08-31 MED ORDER — HEPARIN SODIUM (PORCINE) 1000 UNIT/ML IJ SOLN
INTRAMUSCULAR | Status: AC
  Filled 2019-08-31: qty 1

## 2019-08-31 SURGICAL SUPPLY — 11 items
CATH 5FR JL3.5 JR4 ANG PIG MP (CATHETERS) ×1 IMPLANT
DEVICE RAD COMP TR BAND LRG (VASCULAR PRODUCTS) ×1 IMPLANT
GLIDESHEATH SLEND SS 6F .021 (SHEATH) ×1 IMPLANT
GUIDEWIRE INQWIRE 1.5J.035X260 (WIRE) IMPLANT
INQWIRE 1.5J .035X260CM (WIRE) ×2
KIT HEART LEFT (KITS) ×2 IMPLANT
PACK CARDIAC CATHETERIZATION (CUSTOM PROCEDURE TRAY) ×2 IMPLANT
SHEATH PROBE COVER 6X72 (BAG) ×1 IMPLANT
SYR MEDRAD MARK 7 150ML (SYRINGE) ×2 IMPLANT
TRANSDUCER W/STOPCOCK (MISCELLANEOUS) ×2 IMPLANT
TUBING CIL FLEX 10 FLL-RA (TUBING) ×2 IMPLANT

## 2019-08-31 NOTE — Discharge Instructions (Signed)
Drink plenty of fluids °Keep right arm at or above heart level  °Radial Site Care ° °This sheet gives you information about how to care for yourself after your procedure. Your health care provider may also give you more specific instructions. If you have problems or questions, contact your health care provider. °What can I expect after the procedure? °After the procedure, it is common to have: °· Bruising and tenderness at the catheter insertion area. °Follow these instructions at home: °Medicines °· Take over-the-counter and prescription medicines only as told by your health care provider. °Insertion site care °· Follow instructions from your health care provider about how to take care of your insertion site. Make sure you: °? Wash your hands with soap and water before you change your bandage (dressing). If soap and water are not available, use hand sanitizer. °? Change your dressing as told by your health care provider. °? Leave stitches (sutures), skin glue, or adhesive strips in place. These skin closures may need to stay in place for 2 weeks or longer. If adhesive strip edges start to loosen and curl up, you may trim the loose edges. Do not remove adhesive strips completely unless your health care provider tells you to do that. °· Check your insertion site every day for signs of infection. Check for: °? Redness, swelling, or pain. °? Fluid or blood. °? Pus or a bad smell. °? Warmth. °· Do not take baths, swim, or use a hot tub until your health care provider approves. °· You may shower 24-48 hours after the procedure, or as directed by your health care provider. °? Remove the dressing and gently wash the site with plain soap and water. °? Pat the area dry with a clean towel. °? Do not rub the site. That could cause bleeding. °· Do not apply powder or lotion to the site. °Activity ° °· For 24 hours after the procedure, or as directed by your health care provider: °? Do not flex or bend the affected arm. °? Do  not push or pull heavy objects with the affected arm. °? Do not drive yourself home from the hospital or clinic. You may drive 24 hours after the procedure unless your health care provider tells you not to. °? Do not operate machinery or power tools. °· Do not lift anything that is heavier than 10 lb (4.5 kg), or the limit that you are told, until your health care provider says that it is safe. °· Ask your health care provider when it is okay to: °? Return to work or school. °? Resume usual physical activities or sports. °? Resume sexual activity. °General instructions °· If the catheter site starts to bleed, raise your arm and put firm pressure on the site. If the bleeding does not stop, get help right away. This is a medical emergency. °· If you went home on the same day as your procedure, a responsible adult should be with you for the first 24 hours after you arrive home. °· Keep all follow-up visits as told by your health care provider. This is important. °Contact a health care provider if: °· You have a fever. °· You have redness, swelling, or yellow drainage around your insertion site. °Get help right away if: °· You have unusual pain at the radial site. °· The catheter insertion area swells very fast. °· The insertion area is bleeding, and the bleeding does not stop when you hold steady pressure on the area. °· Your arm or hand   becomes pale, cool, tingly, or numb. °These symptoms may represent a serious problem that is an emergency. Do not wait to see if the symptoms will go away. Get medical help right away. Call your local emergency services (911 in the [place] Do not drive yourself to the hospital. °Summary °· After the procedure, it is common to have bruising and tenderness at the site. °· Follow instructions from your health care provider about how to take care of your radial site wound. Check the wound every day for signs of infection. °· Do not lift anything that is heavier than 10 lb (4.5 kg), or the  limit that you are told, until your health care provider says that it is safe. °This information is not intended to replace advice given to you by your health care provider. Make sure you discuss any questions you have with your health care provider. °Document Released: 12/07/2010 Document Revised: 12/10/2017 Document Reviewed: 12/10/2017 °Elsevier Patient Education © 2020 Elsevier Inc. ° °

## 2019-08-31 NOTE — Interval H&P Note (Signed)
History and Physical Interval Note:  08/31/2019 7:28 AM  Sharon Harrell  has presented today for surgery, with the diagnosis of angina.  The various methods of treatment have been discussed with the patient and family. After consideration of risks, benefits and other options for treatment, the patient has consented to  Procedure(s): LEFT HEART CATH AND CORONARY ANGIOGRAPHY (N/A) as a surgical intervention.  The patient's history has been reviewed, patient examined, no change in status, stable for surgery.  I have reviewed the patient's chart and labs.  Questions were answered to the patient's satisfaction.   Cath Lab Visit (complete for each Cath Lab visit)  Clinical Evaluation Leading to the Procedure:   ACS: No.  Non-ACS:    Anginal Classification: CCS III  Anti-ischemic medical therapy: Minimal Therapy (1 class of medications)  Non-Invasive Test Results: No non-invasive testing performed  Prior CABG: No previous CABG        Jalan Bodi Martinique MD,FACC 08/31/2019 7:29 AM

## 2019-08-31 NOTE — Progress Notes (Signed)
Ambulated in hallway tol well ambulated to bathroom to void.

## 2019-08-31 NOTE — Progress Notes (Signed)
Discharge instructions reviewed with pt and her husband (via telephone) both voice understanding.  

## 2019-09-05 NOTE — Progress Notes (Signed)
Sharon Harrell is a 68 y.o. female is here for follow up.  History of Present Illness:   HPI: Doing well. Rx Metoprolol for palpitations helping. Had clear cath.   Health Maintenance Due  Topic Date Due  . INFLUENZA VACCINE  06/19/2019   Depression screen PHQ 2/9 09/06/2019  Decreased Interest 0  Down, Depressed, Hopeless 1  PHQ - 2 Score 1  Altered sleeping 0  Tired, decreased energy 0  Change in appetite 0  Feeling bad or failure about yourself  0  Trouble concentrating 0  Moving slowly or fidgety/restless 0  Suicidal thoughts 0  PHQ-9 Score 1  Difficult doing work/chores Not difficult at all   PMHx, SurgHx, SocialHx, FamHx, Medications, and Allergies were reviewed in the Visit Navigator and updated as appropriate.   Patient Active Problem List   Diagnosis Date Noted  . Hyperlipidemia 08/31/2019  . Exertional chest pain 08/31/2019  . Esophageal dysphagia 07/01/2019  . Gastroesophageal reflux disease 07/01/2019  . Chronic idiopathic constipation 07/01/2019  . Macular puckering, right eye 02/18/2019  . Vitreous degeneration of both eyes 02/18/2019  . Age-related nuclear cataract, bilateral 02/18/2019  . Bradycardia 10/21/2018  . Frequent PVCs 10/07/2018  . Allergic rhinitis 02/21/2018  . Family history of breast cancer 02/21/2018  . High serum high density lipoprotein (HDL) 02/21/2018  . Hepatic cyst 02/21/2018  . Laryngitis from reflux of stomach acid 07/16/2017  . Osteoporosis 07/16/2017  . Nocturnal leg cramps 06/06/2014   Social History   Tobacco Use  . Smoking status: Former Smoker    Packs/day: 0.25    Years: 8.00    Pack years: 2.00    Quit date: 11/19/1971    Years since quitting: 47.8  . Smokeless tobacco: Never Used  Substance Use Topics  . Alcohol use: Yes    Comment: socially  . Drug use: No   Current Medications and Allergies   Current Outpatient Medications:  .  alendronate (FOSAMAX) 70 MG tablet, TAKE 1 TABLET BY MOUTH EVERY 7 DAYS.  TAKE WITH A FULL GLASS OF WATER ON AN EMPTY STOMACH., Disp: 12 tablet, Rfl: 0 .  Cholecalciferol (VITAMIN D-3) 5000 UNITS TABS, Take 4,000 Units by mouth daily. , Disp: , Rfl:  .  [employer] MAGNESIUM CITRATE PO, Take 250 mg by mouth daily. , Disp: , Rfl:  .  famotidine (PEPCID) 20 MG tablet, Take 20 mg by mouth at bedtime. , Disp: , Rfl:  .  fish oil-omega-3 fatty acids 1000 MG capsule, Take 1 g by mouth daily.  , Disp: , Rfl:  .  fluticasone (FLONASE) 50 MCG/ACT nasal spray, Place 1 spray into both nostrils daily as needed for allergies. , Disp: , Rfl:  .  levothyroxine (SYNTHROID) 75 MCG tablet, TAKE 1 TABLET BY MOUTH EVERY DAY, Disp: 90 tablet, Rfl: 3 .  metoprolol succinate (TOPROL-XL) 25 MG 24 hr tablet, Take 25 mg by mouth daily., Disp: , Rfl:  .  metroNIDAZOLE (METROCREAM) 0.75 % cream, Apply 1 application topically daily as needed (rosacea). , Disp: , Rfl:  .  mometasone (ELOCON) 0.1 % cream, Apply 1 application topically daily as needed (itchy ears). , Disp: , Rfl:  .  Multiple Vitamin (MULTIVITAMIN) tablet, Take 1 tablet by mouth daily.  , Disp: , Rfl:  .  nitroGLYCERIN (NITROSTAT) 0.4 MG SL tablet, Place 1 tablet (0.4 mg total) under the tongue every 5 (five) minutes as needed for chest pain., Disp: 25 tablet, Rfl: 3 .  olopatadine (PATANOL) 0.1 % ophthalmic  solution, Place 1 drop into both eyes 2 (two) times daily as needed for allergies. , Disp: , Rfl:   No Known Allergies   Review of Systems   Pertinent items are noted in the HPI. Otherwise, a complete ROS is negative.  Vitals   Vitals:   09/06/19 1113  BP: 120/72  Pulse: 71  Temp: 97.8 F (36.6 C)  SpO2: 99%  Weight: 130 lb (59 kg)  Height: 5\' 6"  (1.676 m)     Body mass index is 20.98 kg/m.  Physical Exam   Physical Exam Vitals signs and nursing note reviewed.  Constitutional:      General: She is not in acute distress.    Appearance: She is well-developed.  HENT:     Head: Normocephalic and atraumatic.     Right  Ear: External ear normal.     Left Ear: External ear normal.     Nose: Nose normal.  Eyes:     Conjunctiva/sclera: Conjunctivae normal.     Pupils: Pupils are equal, round, and reactive to light.  Neck:     Musculoskeletal: Normal range of motion and neck supple.     Thyroid: No thyromegaly.  Cardiovascular:     Rate and Rhythm: Normal rate and regular rhythm.     Heart sounds: Normal heart sounds.  Pulmonary:     Effort: Pulmonary effort is normal.     Breath sounds: Normal breath sounds.  Abdominal:     General: Bowel sounds are normal.     Palpations: Abdomen is soft.  Musculoskeletal: Normal range of motion.  Lymphadenopathy:     Cervical: No cervical adenopathy.  Skin:    General: Skin is warm and dry.     Capillary Refill: Capillary refill takes less than 2 seconds.  Neurological:     Mental Status: She is alert and oriented to person, place, and time.  Psychiatric:        Behavior: Behavior normal.    Assessment and Plan   Mauresha was seen today for annual exam.  Diagnoses and all orders for this visit:  Hyperlipidemia, unspecified hyperlipidemia type -     Comprehensive metabolic panel -     Lipid panel  High serum high density lipoprotein (HDL)  Nocturnal leg cramps -     CBC with Differential/Platelet -     Comprehensive metabolic panel -     Magnesium  Vitamin D deficiency -     VITAMIN D 25 Hydroxy (Vit-D Deficiency, Fractures)  B12 deficiency -     Vitamin B12   . Orders and follow up as documented in [place], reviewed diet, exercise and weight control, cardiovascular risk and specific lipid/LDL goals reviewed, reviewed medications and side effects in detail.  . Reviewed expectations re: course of current medical issues. . Outlined signs and symptoms indicating need for more acute intervention. . Patient verbalized understanding and all questions were answered. . Patient received an After Visit Summary.  Briscoe Deutscher, DO [place] [place] 09/06/2019

## 2019-09-06 ENCOUNTER — Other Ambulatory Visit: Payer: Self-pay

## 2019-09-06 ENCOUNTER — Encounter: Payer: Self-pay | Admitting: Obstetrics and Gynecology

## 2019-09-06 ENCOUNTER — Encounter: Payer: Self-pay | Admitting: Family Medicine

## 2019-09-06 ENCOUNTER — Ambulatory Visit (INDEPENDENT_AMBULATORY_CARE_PROVIDER_SITE_OTHER): Payer: Medicare Other | Admitting: Family Medicine

## 2019-09-06 ENCOUNTER — Other Ambulatory Visit: Payer: Self-pay | Admitting: Obstetrics and Gynecology

## 2019-09-06 VITALS — BP 120/72 | HR 71 | Temp 97.8°F | Ht 66.0 in | Wt 130.0 lb

## 2019-09-06 DIAGNOSIS — E559 Vitamin D deficiency, unspecified: Secondary | ICD-10-CM | POA: Diagnosis not present

## 2019-09-06 DIAGNOSIS — G4762 Sleep related leg cramps: Secondary | ICD-10-CM

## 2019-09-06 DIAGNOSIS — E785 Hyperlipidemia, unspecified: Secondary | ICD-10-CM

## 2019-09-06 DIAGNOSIS — R7989 Other specified abnormal findings of blood chemistry: Secondary | ICD-10-CM

## 2019-09-06 DIAGNOSIS — E538 Deficiency of other specified B group vitamins: Secondary | ICD-10-CM

## 2019-09-06 LAB — CBC WITH DIFFERENTIAL/PLATELET
Basophils Absolute: 0 10*3/uL (ref 0.0–0.1)
Basophils Relative: 0.5 % (ref 0.0–3.0)
Eosinophils Absolute: 0.1 10*3/uL (ref 0.0–0.7)
Eosinophils Relative: 1.1 % (ref 0.0–5.0)
HCT: 42.3 % (ref 36.0–46.0)
Hemoglobin: 14.5 g/dL (ref 12.0–15.0)
Lymphocytes Relative: 31.4 % (ref 12.0–46.0)
Lymphs Abs: 1.7 10*3/uL (ref 0.7–4.0)
MCHC: 34.3 g/dL (ref 30.0–36.0)
MCV: 89 fl (ref 78.0–100.0)
Monocytes Absolute: 0.4 10*3/uL (ref 0.1–1.0)
Monocytes Relative: 6.8 % (ref 3.0–12.0)
Neutro Abs: 3.3 10*3/uL (ref 1.4–7.7)
Neutrophils Relative %: 60.2 % (ref 43.0–77.0)
Platelets: 259 10*3/uL (ref 150.0–400.0)
RBC: 4.75 Mil/uL (ref 3.87–5.11)
RDW: 13.1 % (ref 11.5–15.5)
WBC: 5.5 10*3/uL (ref 4.0–10.5)

## 2019-09-06 LAB — LIPID PANEL
Cholesterol: 261 mg/dL — ABNORMAL HIGH (ref 0–200)
HDL: 71.9 mg/dL (ref >39.00)
LDL Cholesterol: 164 mg/dL — ABNORMAL HIGH (ref 0–99)
NonHDL: 188.77
Total CHOL/HDL Ratio: 4
Triglycerides: 126 mg/dL (ref 0.0–149.0)
VLDL: 25.2 mg/dL (ref 0.0–40.0)

## 2019-09-06 LAB — VITAMIN B12: Vitamin B-12: 382 pg/mL (ref 211–911)

## 2019-09-06 LAB — VITAMIN D 25 HYDROXY (VIT D DEFICIENCY, FRACTURES): VITD: 66.11 ng/mL (ref 30.00–100.00)

## 2019-09-06 LAB — COMPREHENSIVE METABOLIC PANEL
ALT: 23 U/L (ref 0–35)
AST: 23 U/L (ref 0–37)
Albumin: 4.5 g/dL (ref 3.5–5.2)
Alkaline Phosphatase: 71 U/L (ref 39–117)
BUN: 17 mg/dL (ref 6–23)
CO2: 29 mEq/L (ref 19–32)
Calcium: 9.9 mg/dL (ref 8.4–10.5)
Chloride: 99 mEq/L (ref 96–112)
Creatinine, Ser: 0.69 mg/dL (ref 0.40–1.20)
GFR: 84.75 mL/min (ref >60.00)
Glucose, Bld: 86 mg/dL (ref 70–99)
Potassium: 4.3 mEq/L (ref 3.5–5.1)
Sodium: 137 mEq/L (ref 135–145)
Total Bilirubin: 0.7 mg/dL (ref 0.2–1.2)
Total Protein: 6.7 g/dL (ref 6.0–8.3)

## 2019-09-06 LAB — MAGNESIUM: Magnesium: 2.2 mg/dL (ref 1.5–2.5)

## 2019-09-06 MED ORDER — ALENDRONATE SODIUM 70 MG PO TABS: ORAL_TABLET | ORAL | 0 refills | Status: DC

## 2019-09-06 NOTE — Patient Instructions (Signed)
CT abdomen from 2012  IMPRESSION: 1.  Colonic mural inflammation/inflammatory changes at the junction of the descending colon and sigmoid colon most compatible with acute diverticulitis.  No perforation or abscess.  Focal colitis associated with infection or inflammatory bowel disease are in the differential considerations.  Ischemia unlikely. 2.  Mild ectasia of the right ureter can be associated with ascending urinary tract infection or be secondary to peristalsis. 3.  Hysterectomy with pessary. 4.  Bilateral low density renal lesions compatible with simple renal cysts.

## 2019-09-06 NOTE — Telephone Encounter (Signed)
Medication refill request: alendronate Last AEX:  09-25-17 JJ  Next AEX: 10-07-2019 Last BMD: 07-23-18 osteopenia Refill authorized: Today, please advise.   Medication pended for #6, 0RF to [facility] as confirmed by patient. Please refill if appropriate.

## 2019-09-06 NOTE — Telephone Encounter (Signed)
See message below. Since cath was completely normal, probably makes more sense for her to get back into see EP in the next 2 weeks instead of following up with general cardiology since issues are electrical (PVCs). Can be virtual if needed.   If she wants to keep our appointment I am happy to do so though.If that's the case would then get also something set up back with EP in about 4-6 weeks. Sees Renee/ Lovena Le. Thanks, [employer]

## 2019-09-06 NOTE — Telephone Encounter (Signed)
Patient sent the following correspondence through [facility].  Down to one alendronate sodium pill and don't come in till November.

## 2019-09-08 ENCOUNTER — Encounter: Payer: Self-pay | Admitting: Family Medicine

## 2019-09-08 DIAGNOSIS — Z23 Encounter for immunization: Secondary | ICD-10-CM | POA: Diagnosis not present

## 2019-09-09 NOTE — Telephone Encounter (Signed)
Flu shot documented in chart.

## 2019-09-14 ENCOUNTER — Ambulatory Visit: Payer: Medicare Other | Admitting: Physician Assistant

## 2019-09-28 ENCOUNTER — Other Ambulatory Visit: Payer: Self-pay

## 2019-09-28 ENCOUNTER — Other Ambulatory Visit: Payer: Self-pay | Admitting: Obstetrics and Gynecology

## 2019-09-28 ENCOUNTER — Ambulatory Visit
Admission: RE | Admit: 2019-09-28 | Discharge: 2019-09-28 | Disposition: A | Discharge status: Home / Self Care | Payer: Medicare Other | Source: Ambulatory Visit | Attending: Obstetrics and Gynecology | Admitting: Obstetrics and Gynecology

## 2019-09-28 DIAGNOSIS — N6019 Diffuse cystic mastopathy of unspecified breast: Secondary | ICD-10-CM

## 2019-09-28 DIAGNOSIS — Z1231 Encounter for screening mammogram for malignant neoplasm of breast: Secondary | ICD-10-CM

## 2019-10-06 ENCOUNTER — Encounter: Payer: Self-pay | Admitting: Internal Medicine

## 2019-10-06 ENCOUNTER — Other Ambulatory Visit: Payer: Self-pay

## 2019-10-06 ENCOUNTER — Ambulatory Visit (INDEPENDENT_AMBULATORY_CARE_PROVIDER_SITE_OTHER): Payer: Medicare Other | Admitting: Internal Medicine

## 2019-10-06 VITALS — BP 118/78 | HR 58 | Ht 66.0 in | Wt 131.8 lb

## 2019-10-06 DIAGNOSIS — R002 Palpitations: Secondary | ICD-10-CM | POA: Diagnosis not present

## 2019-10-06 DIAGNOSIS — I493 Ventricular premature depolarization: Secondary | ICD-10-CM

## 2019-10-06 MED ORDER — METOPROLOL SUCCINATE ER 25 MG PO TB24: 25.0000 mg | ORAL_TABLET | Freq: Every day | ORAL | 3 refills | Status: DC

## 2019-10-06 NOTE — Patient Instructions (Addendum)
Medication Instructions:  Your physician has recommended you make the following change in your medication:   1.  Reduce your Toprol XL 25 mg-  Take one tablet by mouth daily.  Labwork: None ordered.  Testing/Procedures: None ordered.  Follow-Up: Your physician wants you to follow-up in: one year with Dr. Lovena Le.   You will receive a reminder letter in the mail two months in advance. If you don't receive a letter, please call our office to schedule the follow-up appointment.  Any Other Special Instructions Will Be Listed Below (If Applicable).  If you need a refill on your cardiac medications before your next appointment, please call your pharmacy.

## 2019-10-06 NOTE — Progress Notes (Signed)
HPI Sharon Harrell returns today for ongoing evaluation of her PVC's and sob. She has undergone cardiac cath which showed normal coronary arteries and LV function and LVEDP. She c/o dyspnea going up hills. She only feels palpitations when she lies down at night to go to bed. She has been maintained on toprol 25 then 37.5 daily. On the higher dose she cannot feel a difference. No Known Allergies   Current Outpatient Medications  Medication Sig Dispense Refill  . alendronate (FOSAMAX) 70 MG tablet TAKE 1 TABLET BY MOUTH EVERY 7 DAYS. TAKE WITH A FULL GLASS OF WATER ON AN EMPTY STOMACH. 12 tablet 0  . Cholecalciferol (VITAMIN D-3) 5000 UNITS TABS Take 4,000 Units by mouth daily.     . [facility] MAGNESIUM CITRATE PO Take 250 mg by mouth daily.     . famotidine (PEPCID) 20 MG tablet Take 20 mg by mouth at bedtime.     . fish oil-omega-3 fatty acids 1000 MG capsule Take 1 g by mouth daily.      . fluticasone (FLONASE) 50 MCG/ACT nasal spray Place 1 spray into both nostrils daily as needed for allergies.     Marland Kitchen levothyroxine (SYNTHROID) 75 MCG tablet TAKE 1 TABLET BY MOUTH EVERY DAY 90 tablet 3  . metoprolol succinate (TOPROL-XL) 25 MG 24 hr tablet Take 37.5 mg by mouth daily.    . metroNIDAZOLE (METROCREAM) 0.75 % cream Apply 1 application topically daily as needed (rosacea).     . mometasone (ELOCON) 0.1 % cream Apply 1 application topically daily as needed (itchy ears).     . Multiple Vitamin (MULTIVITAMIN) tablet Take 1 tablet by mouth daily.      Marland Kitchen olopatadine (PATANOL) 0.1 % ophthalmic solution Place 1 drop into both eyes 2 (two) times daily as needed for allergies.      No current facility-administered medications for this visit.      Past Medical History:  Diagnosis Date  . Cataract   . DDD (degenerative disc disease), cervical 2006  . Depression 1/06   resolved  . Diverticulitis   . Dysphonia   . Frequent PVCs   . Hyperlipidemia   . Hypothyroidism   . Laryngitis from reflux of  stomach acid    dietary modifications, Omeprazole, Zantac [facility]  . Nocturnal leg cramps 06/06/2014  . Non-seasonal allergic rhinitis   . Osteoporosis 2017  . Post-nasal drip    Flonase, [facility]  . SUI (stress urinary incontinence, female) 12/07  . Vitamin D deficiency disease 11/07    ROS:   All systems reviewed and negative except as noted in the HPI.   Past Surgical History:  Procedure Laterality Date  . BREAST BIOPSY Left 09/30/2018  . BREAST EXCISIONAL BIOPSY Right 1991   benign  . BREAST SURGERY    . COMBINED HYSTERECTOMY VAGINAL W/ MMK / A&P REPAIR  1983  . HAND SURGERY Left 10/2009   S-L   . LEFT HEART CATH AND CORONARY ANGIOGRAPHY N/A 08/31/2019   Procedure: LEFT HEART CATH AND CORONARY ANGIOGRAPHY;  Surgeon: Martinique, Peter M, MD;  Location: [facility];  Service: Cardiovascular;  Laterality: N/A;  . LIPOSUCTION    . TONSILLECTOMY  as child  . WISDOM TOOTH EXTRACTION       Family History  Problem Relation Age of Onset  . Cancer Mother        cervical/breast  . Osteoarthritis Mother   . Dementia Mother   . Breast cancer Mother  32  . Endometriosis Sister   . Heart disease Brother   . Stroke Brother   . Stroke Maternal Grandmother      Social History   Socioeconomic History  . Marital status: Married    Spouse name: Not on file  . Number of children: 2  . Years of education: college-1  . Highest education level: Not on file  Occupational History    Employer: [employer].    Comment: [employer]  Social Needs  . Financial resource strain: Not on file  . Food insecurity    Worry: Not on file    Inability: Not on file  . Transportation needs    Medical: Not on file    Non-medical: Not on file  Tobacco Use  . Smoking status: Former Smoker    Packs/day: 0.25    Years: 8.00    Pack years: 2.00    Quit date: 11/19/1971    Years since quitting: 47.9  . Smokeless tobacco: Never Used  Substance and Sexual  Activity  . Alcohol use: Yes    Comment: socially  . Drug use: No  . Sexual activity: Not Currently    Partners: Male    Birth control/protection: Surgical    Comment: hysterectomy  Lifestyle  . Physical activity    Days per week: Not on file    Minutes per session: Not on file  . Stress: Not on file  Relationships  . Social Herbalist on phone: Not on file    Gets together: Not on file    Attends religious service: Not on file    Active member of club or organization: Not on file    Attends meetings of clubs or organizations: Not on file    Relationship status: Not on file  . Intimate partner violence    Fear of current or ex partner: Not on file    Emotionally abused: Not on file    Physically abused: Not on file    Forced sexual activity: Not on file  Other Topics Concern  . Not on file  Social History Narrative  . Not on file     BP 118/78   Pulse (!) 58   Ht 5\' 6"  (1.676 m)   Wt 131 lb 12.8 oz (59.8 kg)   LMP 08/18/1982 (Approximate)   SpO2 99%   BMI 21.27 kg/m   Physical Exam:  Well appearing NAD HEENT: Unremarkable Neck:  No JVD, no thyromegally Lymphatics:  No adenopathy Back:  No CVA tenderness Lungs:  Clear with no wheezes HEART:  Regular rate rhythm, no murmurs, no rubs, no clicks Abd:  soft, positive bowel sounds, no organomegally, no rebound, no guarding Ext:  2 plus pulses, no edema, no cyanosis, no clubbing Skin:  No rashes no nodules Neuro:  CN II through XII intact, motor grossly intact  EKG - NSR with no PVC's   Assess/Plan: 1. PVC's - I do not think the her burden has increased and she is tolerating the beta blocker. She will continue this. I have asked her to reduce her dose down to 25 mg daily. 2. Dyspnea - I strongly suspect that she has diastolic dysfunction. I discussed the pathophysiology. She is encouraged to continue to exercise and reduce her salt intake.  I spent 25 minutes including 50% face to face time with the  patient today. Mikle Bosworth.D.

## 2019-10-07 ENCOUNTER — Ambulatory Visit: Payer: Medicare Other | Admitting: Obstetrics and Gynecology

## 2019-10-21 DIAGNOSIS — L718 Other rosacea: Secondary | ICD-10-CM | POA: Diagnosis not present

## 2019-10-21 DIAGNOSIS — D225 Melanocytic nevi of trunk: Secondary | ICD-10-CM | POA: Diagnosis not present

## 2019-10-21 DIAGNOSIS — D2262 Melanocytic nevi of left upper limb, including shoulder: Secondary | ICD-10-CM | POA: Diagnosis not present

## 2019-10-21 DIAGNOSIS — D1801 Hemangioma of skin and subcutaneous tissue: Secondary | ICD-10-CM | POA: Diagnosis not present

## 2019-10-21 DIAGNOSIS — L814 Other melanin hyperpigmentation: Secondary | ICD-10-CM | POA: Diagnosis not present

## 2019-10-21 DIAGNOSIS — L821 Other seborrheic keratosis: Secondary | ICD-10-CM | POA: Diagnosis not present

## 2019-11-19 HISTORY — PX: BREAST BIOPSY: SHX20

## 2019-12-07 ENCOUNTER — Other Ambulatory Visit: Payer: Self-pay

## 2019-12-07 MED ORDER — METOPROLOL SUCCINATE ER 25 MG PO TB24: 25.0000 mg | ORAL_TABLET | Freq: Every day | ORAL | 2 refills | Status: DC

## 2019-12-07 NOTE — Telephone Encounter (Signed)
Pt changed pharmacies and needed medication switched over.

## 2019-12-08 ENCOUNTER — Telehealth: Payer: Self-pay | Admitting: Family Medicine

## 2019-12-08 NOTE — Telephone Encounter (Signed)
MEDICATION: alendronate (FOSAMAX) 70 MG  PHARMACY: [id number] in [place]  Comments:   **Let patient know to contact pharmacy at the end of the day to make sure medication is ready. **  ** Please notify patient to allow 48-72 hours to process**  **Encourage patient to contact the pharmacy for refills or they can request refills through [facility]**

## 2019-12-09 ENCOUNTER — Other Ambulatory Visit: Payer: Self-pay

## 2019-12-09 DIAGNOSIS — M810 Age-related osteoporosis without current pathological fracture: Secondary | ICD-10-CM

## 2019-12-09 MED ORDER — ALENDRONATE SODIUM 70 MG PO TABS: ORAL_TABLET | ORAL | 0 refills | Status: DC

## 2019-12-09 NOTE — Telephone Encounter (Signed)
Fosamax sent to [facility] in [place].

## 2019-12-24 ENCOUNTER — Telehealth: Payer: Self-pay | Admitting: Family Medicine

## 2019-12-24 NOTE — Telephone Encounter (Signed)
I spoke with the patient, and she agreed to AWV-I with Loma Sousa on 12/28/19 after seeing Dr. Jonni Sanger.

## 2019-12-28 ENCOUNTER — Encounter: Payer: Self-pay | Admitting: Family Medicine

## 2019-12-28 ENCOUNTER — Ambulatory Visit (INDEPENDENT_AMBULATORY_CARE_PROVIDER_SITE_OTHER): Payer: Medicare Other

## 2019-12-28 ENCOUNTER — Ambulatory Visit (INDEPENDENT_AMBULATORY_CARE_PROVIDER_SITE_OTHER): Payer: Medicare Other | Admitting: Family Medicine

## 2019-12-28 ENCOUNTER — Other Ambulatory Visit: Payer: Self-pay

## 2019-12-28 VITALS — BP 102/64 | HR 94 | Temp 98.0°F | Ht 66.0 in | Wt 134.4 lb

## 2019-12-28 VITALS — BP 102/64 | HR 94 | Temp 98.0°F | Ht 66.0 in | Wt 134.5 lb

## 2019-12-28 DIAGNOSIS — L719 Rosacea, unspecified: Secondary | ICD-10-CM | POA: Diagnosis not present

## 2019-12-28 DIAGNOSIS — Z0000 Encounter for general adult medical examination without abnormal findings: Secondary | ICD-10-CM | POA: Diagnosis not present

## 2019-12-28 DIAGNOSIS — E782 Mixed hyperlipidemia: Secondary | ICD-10-CM

## 2019-12-28 DIAGNOSIS — Z23 Encounter for immunization: Secondary | ICD-10-CM | POA: Diagnosis not present

## 2019-12-28 DIAGNOSIS — K219 Gastro-esophageal reflux disease without esophagitis: Secondary | ICD-10-CM | POA: Diagnosis not present

## 2019-12-28 DIAGNOSIS — M810 Age-related osteoporosis without current pathological fracture: Secondary | ICD-10-CM

## 2019-12-28 DIAGNOSIS — E039 Hypothyroidism, unspecified: Secondary | ICD-10-CM | POA: Diagnosis not present

## 2019-12-28 DIAGNOSIS — G4762 Sleep related leg cramps: Secondary | ICD-10-CM | POA: Diagnosis not present

## 2019-12-28 DIAGNOSIS — N3281 Overactive bladder: Secondary | ICD-10-CM

## 2019-12-28 DIAGNOSIS — H1010 Acute atopic conjunctivitis, unspecified eye: Secondary | ICD-10-CM | POA: Insufficient documentation

## 2019-12-28 DIAGNOSIS — K5730 Diverticulosis of large intestine without perforation or abscess without bleeding: Secondary | ICD-10-CM

## 2019-12-28 DIAGNOSIS — I493 Ventricular premature depolarization: Secondary | ICD-10-CM

## 2019-12-28 HISTORY — DX: Diverticulosis of large intestine without perforation or abscess without bleeding: K57.30

## 2019-12-28 MED ORDER — PNEUMOCOCCAL VAC POLYVALENT 25 MCG/0.5ML IJ INJ: 0.5000 mL | INJECTION | Freq: Once | INTRAMUSCULAR | 0 refills | Status: AC

## 2019-12-28 NOTE — Patient Instructions (Addendum)
Sharon Harrell , Thank you for taking time to come for your Medicare Wellness Visit. I appreciate your ongoing commitment to your health goals. Please review the following plan we discussed and let me know if I can assist you in the future.   Screening recommendations/referrals: Colorectal Screening: up to date; colonoscopy last 07/14/19 Mammogram: up to date; last 09/28/19 Bone Density: up to date; last 07/23/18   Vision and Dental Exams: Recommended annual ophthalmology exams for early detection of glaucoma and other disorders of the eye Recommended annual dental exams for proper oral hygiene  Vaccinations: Influenza vaccine: completed  Pneumococcal vaccine: follow through with Pneumonia vaccine  Tdap vaccine: up to date; last 02/25/19 Shingles vaccine: Please call your insurance company to determine your out of pocket expense for the Shingrix vaccine. You may receive this vaccine at your local pharmacy. (see handout)   Advanced directives: Please bring a copy of your POA (Power of Attorney) and/or Living Will to your next appointment.  Goals: Recommend to drink at least 6-8 8oz glasses of water per day and consume a balanced diet rich in fresh fruits and vegetables.   Next appointment: Please schedule your Annual Wellness Visit with your Nurse Health Advisor in one year.  Preventive Care 52 Years and Older, Female Preventive care refers to lifestyle choices and visits with your health care provider that can promote health and wellness. What does preventive care include?  A yearly physical exam. This is also called an annual well check.  Dental exams once or twice a year.  Routine eye exams. Ask your health care provider how often you should have your eyes checked.  Personal lifestyle choices, including:  Daily care of your teeth and gums.  Regular physical activity.  Eating a healthy diet.  Avoiding tobacco and drug use.  Limiting alcohol use.  Practicing safe sex.  Taking  low-dose aspirin every day if recommended by your health care provider.  Taking vitamin and mineral supplements as recommended by your health care provider. What happens during an annual well check? The services and screenings done by your health care provider during your annual well check will depend on your age, overall health, lifestyle risk factors, and family history of disease. Counseling  Your health care provider may ask you questions about your:  Alcohol use.  Tobacco use.  Drug use.  Emotional well-being.  Home and relationship well-being.  Sexual activity.  Eating habits.  History of falls.  Memory and ability to understand (cognition).  Work and work Statistician.  Reproductive health. Screening  You may have the following tests or measurements:  Height, weight, and BMI.  Blood pressure.  Lipid and cholesterol levels. These may be checked every 5 years, or more frequently if you are over 70 years old.  Skin check.  Lung cancer screening. You may have this screening every year starting at age 42 if you have a 30-pack-year history of smoking and currently smoke or have quit within the past 15 years.  Fecal occult blood test (FOBT) of the stool. You may have this test every year starting at age 7.  Flexible sigmoidoscopy or colonoscopy. You may have a sigmoidoscopy every 5 years or a colonoscopy every 10 years starting at age 31.  Hepatitis C blood test.  Hepatitis B blood test.  Sexually transmitted disease (STD) testing.  Diabetes screening. This is done by checking your blood sugar (glucose) after you have not eaten for a while (fasting). You may have this done every 1-3 years.  Bone density scan. This is done to screen for osteoporosis. You may have this done starting at age 67.  Mammogram. This may be done every 1-2 years. Talk to your health care provider about how often you should have regular mammograms. Talk with your health care provider  about your test results, treatment options, and if necessary, the need for more tests. Vaccines  Your health care provider may recommend certain vaccines, such as:  Influenza vaccine. This is recommended every year.  Tetanus, diphtheria, and acellular pertussis (Tdap, Td) vaccine. You may need a Td booster every 10 years.  Zoster vaccine. You may need this after age 82.  Pneumococcal 13-valent conjugate (PCV13) vaccine. One dose is recommended after age 34.  Pneumococcal polysaccharide (PPSV23) vaccine. One dose is recommended after age 74. Talk to your health care provider about which screenings and vaccines you need and how often you need them. This information is not intended to replace advice given to you by your health care provider. Make sure you discuss any questions you have with your health care provider. Document Released: 12/01/2015 Document Revised: 07/24/2016 Document Reviewed: 09/05/2015 Elsevier Interactive Patient Education  2017 [place] Prevention in the Home Falls can cause injuries. They can happen to people of all ages. There are many things you can do to make your home safe and to help prevent falls. What can I do on the outside of my home?  Regularly fix the edges of walkways and driveways and fix any cracks.  Remove anything that might make you trip as you walk through a door, such as a raised step or threshold.  Trim any bushes or trees on the path to your home.  Use bright outdoor lighting.  Clear any walking paths of anything that might make someone trip, such as rocks or tools.  Regularly check to see if handrails are loose or broken. Make sure that both sides of any steps have handrails.  Any raised decks and porches should have guardrails on the edges.  Have any leaves, snow, or ice cleared regularly.  Use sand or salt on walking paths during winter.  Clean up any spills in your garage right away. This includes oil or grease  spills. What can I do in the bathroom?  Use night lights.  Install grab bars by the toilet and in the tub and shower. Do not use towel bars as grab bars.  Use non-skid mats or decals in the tub or shower.  If you need to sit down in the shower, use a plastic, non-slip stool.  Keep the floor dry. Clean up any water that spills on the floor as soon as it happens.  Remove soap buildup in the tub or shower regularly.  Attach bath mats securely with double-sided non-slip rug tape.  Do not have throw rugs and other things on the floor that can make you trip. What can I do in the bedroom?  Use night lights.  Make sure that you have a light by your bed that is easy to reach.  Do not use any sheets or blankets that are too big for your bed. They should not hang down onto the floor.  Have a firm chair that has side arms. You can use this for support while you get dressed.  Do not have throw rugs and other things on the floor that can make you trip. What can I do in the kitchen?  Clean up any spills right away.  Avoid walking  on wet floors.  Keep items that you use a lot in easy-to-reach places.  If you need to reach something above you, use a strong step stool that has a grab bar.  Keep electrical cords out of the way.  Do not use floor polish or wax that makes floors slippery. If you must use wax, use non-skid floor wax.  Do not have throw rugs and other things on the floor that can make you trip. What can I do with my stairs?  Do not leave any items on the stairs.  Make sure that there are handrails on both sides of the stairs and use them. Fix handrails that are broken or loose. Make sure that handrails are as long as the stairways.  Check any carpeting to make sure that it is firmly attached to the stairs. Fix any carpet that is loose or worn.  Avoid having throw rugs at the top or bottom of the stairs. If you do have throw rugs, attach them to the floor with carpet  tape.  Make sure that you have a light switch at the top of the stairs and the bottom of the stairs. If you do not have them, ask someone to add them for you. What else can I do to help prevent falls?  Wear shoes that:  Do not have high heels.  Have rubber bottoms.  Are comfortable and fit you well.  Are closed at the toe. Do not wear sandals.  If you use a stepladder:  Make sure that it is fully opened. Do not climb a closed stepladder.  Make sure that both sides of the stepladder are locked into place.  Ask someone to hold it for you, if possible.  Clearly mark and make sure that you can see:  Any grab bars or handrails.  First and last steps.  Where the edge of each step is.  Use tools that help you move around (mobility aids) if they are needed. These include:  Canes.  Walkers.  Scooters.  Crutches.  Turn on the lights when you go into a dark area. Replace any light bulbs as soon as they burn out.  Set up your furniture so you have a clear path. Avoid moving your furniture around.  If any of your floors are uneven, fix them.  If there are any pets around you, be aware of where they are.  Review your medicines with your doctor. Some medicines can make you feel dizzy. This can increase your chance of falling. Ask your doctor what other things that you can do to help prevent falls. This information is not intended to replace advice given to you by your health care provider. Make sure you discuss any questions you have with your health care provider. Document Released: 08/31/2009 Document Revised: 04/11/2016 Document Reviewed: 12/09/2014 Elsevier Interactive Patient Education  2017 [employer].

## 2019-12-28 NOTE — Progress Notes (Signed)
Subjective  CC:  Chief Complaint  Patient presents with  . Transitions Of Care    TOC from Dr. Juleen China  . Hyperlipidemia    walks daily about 2 miles. Healthy diet. Does not take anything for cholesterol  . Gastroesophageal Reflux    stopped taking Ranitidine. Takes pepcid. well controlled    HPI: Sharon Harrell is a 69 y.o. female who presents to [facility] today to establish care with me as a new patient.   She has the following concerns or needs:  Very pleasant and mostly health 68 yo married recently retired mother of 2 grown boys and 5 grandchildren, native of [place]. Happy. Feels well. I reviewed years of old records and specialty consults including recent cardiac eval, cardiac cath report, echocardiogram and EP eval. Bone density x 2 updated results in PL. We discussed her chronic medical problems in detail and I updated her chart.   Mild HLD not on meds. Would prefer red yeast rice trial. No FH of early Cad. Brother wi/ HLD and CAD. Recent clean cath. H/o PVC on bb  Osteoporosis tolerating fosamax. Active lifestyle  GERd controlled on h2blocker.   Leg cramps: chronic and neg eval by rhuem and neuro. No relief w/ prescription med, mag, K+, baclofen  Low thyroid well controlled.   New problem: describes OAB sxs: worse at night; has urgency and needs to rush to bathroom. No incontinence. Has h/o mild SI. Postmenopausal. S/p hysterectomy  Assessment  1. Mixed hyperlipidemia   2. Acquired hypothyroidism   3. Osteoporosis, unspecified osteoporosis type, unspecified pathological fracture presence   4. Frequent PVCs   5. Gastroesophageal reflux disease without esophagitis   6. Nocturnal leg cramps   7. Rosacea   8. Overactive bladder      Plan   HLD: discussed borderline readings. At this time, red yeast rice and monitor. May warrant statin treatment in the future.   Low thyroid is at goal.   Osteoporosis: conitnue fosamax. Due dexa next  year. Has improved on meds.   Pvcs: stable per cards.  GERD improved on ppi/controlled  Leg cramps: trial of tonic water recommended  OAB: education and HO. Pt will work on behavioral strategies. Consider estrogen cream and meds if not improving.   Pneumovax: pt elects to get at [facility]. RX printed.    Follow up:  Oct 2021 for cpe No orders of the defined types were placed in this encounter.  Meds ordered this encounter  Medications  . pneumococcal 23 valent vaccine (PNEUMOVAX-23) 25 MCG/0.5ML injection    Sig: Inject 0.5 mLs into the muscle once for 1 dose.    Dispense:  2.5 mL    Refill:  0     Depression screen [facility] 2/9 12/28/2019 09/06/2019  Decreased Interest 0 0  Down, Depressed, Hopeless 0 1  PHQ - 2 Score 0 1  Altered sleeping - 0  Tired, decreased energy - 0  Change in appetite - 0  Feeling bad or failure about yourself  - 0  Trouble concentrating - 0  Moving slowly or fidgety/restless - 0  Suicidal thoughts - 0  PHQ-9 Score - 1  Difficult doing work/chores - Not difficult at all    We updated and reviewed the patient's past history in detail and it is documented below.  Patient Active Problem List   Diagnosis Date Noted  . Acquired hypothyroidism 12/28/2019    Priority: High  . Mixed hyperlipidemia 08/31/2019  Priority: High  . Frequent PVCs 10/07/2018    Priority: High    Normal cardiac cath and nl EF 08/2019, Dr. Martinique   . Osteoporosis 07/16/2017    Priority: High    Dr. Talbert Nan: 2017: T = -2.5, started meds: f/u 2019 improved T = - 2.1 on fosamax.   . Esophageal dysphagia 07/01/2019    Priority: Medium    Followed by  Dr. Collene Mares last seen 06/08/19 EGD ordered.  EGD preformed on [id number] impression: normal appearing widely patent esophagus and GEJ-Z line was measured at 40 cm. Mild antral gastritis. Normal examined duodenum. No specimens collected.    . Gastroesophageal reflux disease 07/01/2019    Priority: Medium    Followed by  Dr. Collene Mares last  seen 06/08/19   . Chronic idiopathic constipation 07/01/2019    Priority: Medium    Followed by  Dr. Collene Mares last seen 06/08/19   . Family history of breast cancer 02/21/2018    Priority: Medium    Lifetime risk is greater than 20%. She has history of a benign surgical excisional biopsy of the right breast in 1991.   . Laryngopharyngeal reflux (LPR) 07/16/2017    Priority: Medium  . Allergic conjunctivitis 12/28/2019    Priority: Low  . Diverticulosis of colon 12/28/2019    Priority: Low    Multiple large mouth colonoscopy finding   . Rosacea 12/28/2019    Priority: Low  . Chronic allergic rhinitis 02/21/2018    Priority: Low  . Hepatic cyst 02/21/2018    Priority: Low  . Nocturnal leg cramps 06/06/2014    Priority: Low  . Vitreous degeneration of both eyes 02/18/2019   Health Maintenance  Topic Date Due  . PNA vac Low Risk Adult (2 of 2 - PPSV23) 11/03/2019  . DEXA SCAN  07/23/2020  . MAMMOGRAM  09/27/2020  . TETANUS/TDAP  02/24/2029  . COLONOSCOPY  07/13/2029  . INFLUENZA VACCINE  Completed  . Hepatitis C Screening  Completed   Immunization History  Administered Date(s) Administered  . Influenza, High Dose Seasonal PF 08/22/2017, 08/31/2018, 09/08/2019  . Influenza-Unspecified 08/22/2017  . Pneumococcal Conjugate-13 11/02/2018  . Td 02/25/2019  . Zoster 04/18/2012   Current Meds  Medication Sig  . alendronate (FOSAMAX) 70 MG tablet TAKE 1 TABLET BY MOUTH EVERY 7 DAYS. TAKE WITH A FULL GLASS OF WATER ON AN EMPTY STOMACH.  Marland Kitchen Cholecalciferol (VITAMIN D-3) 5000 UNITS TABS Take 4,000 Units by mouth daily.   . [employer] MAGNESIUM CITRATE PO Take 250 mg by mouth daily.   . famotidine (PEPCID) 20 MG tablet Take 20 mg by mouth at bedtime.   . fish oil-omega-3 fatty acids 1000 MG capsule Take 1 g by mouth daily.    . fluticasone (FLONASE) 50 MCG/ACT nasal spray Place 1 spray into both nostrils daily as needed for allergies.   Marland Kitchen levothyroxine (SYNTHROID) 75 MCG tablet TAKE 1 TABLET  BY MOUTH EVERY DAY  . metoprolol succinate (TOPROL XL) 25 MG 24 hr tablet Take 1 tablet (25 mg total) by mouth daily.  . metroNIDAZOLE (METROCREAM) 0.75 % cream Apply 1 application topically daily as needed (rosacea).   . mometasone (ELOCON) 0.1 % cream Apply 1 application topically daily as needed (itchy ears).   . Multiple Vitamin (MULTIVITAMIN) tablet Take 1 tablet by mouth daily.    Marland Kitchen olopatadine (PATANOL) 0.1 % ophthalmic solution Place 1 drop into both eyes 2 (two) times daily as needed for allergies.     Allergies: Patient has No Known Allergies.  Past Medical History Patient  has a past medical history of Cataract, DDD (degenerative disc disease), cervical (2006), Depression (1/06), Diverticulitis, Diverticulosis of colon (12/28/2019), Dysphonia, Frequent PVCs, Hyperlipidemia, Hypothyroidism, Nocturnal leg cramps (06/06/2014), Non-seasonal allergic rhinitis, Osteoporosis (2017), SUI (stress urinary incontinence, female) (12/07), and Vitamin D deficiency disease (11/07). Past Surgical History Patient  has a past surgical history that includes Hand surgery (Left, 10/2009); Combined hysterectomy vaginal w/ MMK / A&P repair [id number]); Tonsillectomy (as child); Wisdom tooth extraction; Liposuction; LEFT HEART CATH AND CORONARY ANGIOGRAPHY (N/A, 08/31/2019); and Breast excisional biopsy (Right, 1991). Family History: Patient family history includes Breast cancer (age of onset: 31) in her mother; Cancer in her mother; Dementia in her mother; Endometriosis in her sister; Heart disease in her brother; Osteoarthritis in her mother; Stroke in her brother and maternal grandmother. Social History:  Patient  reports that she quit smoking about 48 years ago. She has a 2.00 pack-year smoking history. She has never used smokeless tobacco. She reports current alcohol use. She reports that she does not use drugs.  Review of Systems: Constitutional: negative for fever or malaise Ophthalmic: negative for  photophobia, double vision or loss of vision Cardiovascular: negative for chest pain, dyspnea on exertion, or new LE swelling Respiratory: negative for SOB or persistent cough Gastrointestinal: negative for abdominal pain, change in bowel habits or melena Genitourinary: negative for dysuria or gross hematuria Musculoskeletal: negative for new gait disturbance or muscular weakness Integumentary: negative for new or persistent rashes Neurological: negative for TIA or stroke symptoms Psychiatric: negative for SI or delusions Allergic/Immunologic: negative for hives  Patient Care Team    Relationship Specialty Notifications Start End  Leamon Arnt, MD PCP - General Family Medicine  12/28/19   Bo Merino, MD Consulting Physician Rheumatology  06/06/14   [facility] [place] Physician Otolaryngology  07/16/17   Merrilee Seashore, MD Consulting Physician Internal Medicine  08/19/17   Rolla Etienne (Inactive) Physician Assistant Hematology and Oncology  09/25/17   Juanita Craver, MD Consulting Physician Gastroenterology  07/01/19   Evans Lance, MD Consulting Physician Cardiology  12/28/19   Jodell Cipro, [place] Physician Cardiology  12/28/19 12/28/19  Dorothy Spark, MD Consulting Physician Cardiology  12/28/19   Salvadore Dom, MD Consulting Physician Obstetrics and Gynecology  12/28/19   Sydnee Levans, MD Consulting Physician Dermatology  12/28/19   Kathrynn Ducking, MD Consulting Physician Neurology  12/28/19     Objective  Vitals: BP 102/64 (BP Location: Right Arm, Patient Position: Sitting, Cuff Size: Normal)   Pulse 94   Temp 98 F (36.7 C) (Temporal)   Ht 5\' 6"  (1.676 m)   Wt 134 lb 6.4 oz (61 kg)   LMP 08/18/1982 (Approximate)   SpO2 96%   BMI 21.69 kg/m  General:  Well developed, well nourished, no acute distress  Psych:  Alert and oriented,normal mood and affect HEENT:  Normocephalic, atraumatic, non-icteric  sclera,no thyromegaly or mass Cardiovascular:  RRR without gallop, rub or murmur, no edema Respiratory:  Good breath sounds bilaterally, CTAB with normal respiratory effort Skin:  Warm, no rashes or suspicious lesions noted Neurologic:    Mental status is normal. Gross motor and sensory exams are normal. Normal gait   Commons side effects, risks, benefits, and alternatives for medications and treatment plan prescribed today were discussed, and the patient expressed understanding of the given instructions. Patient is instructed to call or message via MyChart if he/she has any questions or concerns  regarding our treatment plan. No barriers to understanding were identified. We discussed Red Flag symptoms and signs in detail. Patient expressed understanding regarding what to do in case of urgent or emergency type symptoms.   Medication list was reconciled, printed and provided to the patient in AVS. Patient instructions and summary information was reviewed with the patient as documented in the AVS. This note was prepared with assistance of Dragon voice recognition software. Occasional wrong-word or sound-a-like substitutions may have occurred due to the inherent limitations of voice recognition software  This visit occurred during the SARS-CoV-2 public health emergency.  Safety protocols were in place, including screening questions prior to the visit, additional usage of staff PPE, and extensive cleaning of exam room while observing appropriate contact time as indicated for disinfecting solutions.

## 2019-12-28 NOTE — Progress Notes (Signed)
Subjective:   Sharon Harrell is a 69 y.o. female who presents for an Initial Medicare Annual Wellness Visit.  Review of Systems     Cardiac Risk Factors include: advanced age (>42men, >35 women);dyslipidemia    Objective:    Today's Vitals   12/28/19 1025  BP: 102/64  Pulse: 94  Temp: 98 F (36.7 C)  TempSrc: Temporal  Weight: 134 lb 7.7 oz (61 kg)  Height: 5\' 6"  (1.676 m)   Body mass index is 21.71 kg/m.  Advanced Directives 12/28/2019 08/31/2019  Does Patient Have a Medical Advance Directive? Yes Yes  Type of Advance Directive Living will;Healthcare Power of [place];Living will  Does patient want to make changes to medical advance directive? No - Patient declined No - Patient declined  Copy of [place] in Chart? No - copy requested -    Current Medications (verified) Outpatient Encounter Medications as of 12/28/2019  Medication Sig  . alendronate (FOSAMAX) 70 MG tablet TAKE 1 TABLET BY MOUTH EVERY 7 DAYS. TAKE WITH A FULL GLASS OF WATER ON AN EMPTY STOMACH.  Marland Kitchen Cholecalciferol (VITAMIN D-3) 5000 UNITS TABS Take 4,000 Units by mouth daily.   . CVS MAGNESIUM CITRATE PO Take 250 mg by mouth daily.   . famotidine (PEPCID) 20 MG tablet Take 20 mg by mouth at bedtime.   . fish oil-omega-3 fatty acids 1000 MG capsule Take 1 g by mouth daily.    . fluticasone (FLONASE) 50 MCG/ACT nasal spray Place 1 spray into both nostrils daily as needed for allergies.   Marland Kitchen levothyroxine (SYNTHROID) 75 MCG tablet TAKE 1 TABLET BY MOUTH EVERY DAY  . metoprolol succinate (TOPROL XL) 25 MG 24 hr tablet Take 1 tablet (25 mg total) by mouth daily.  . metroNIDAZOLE (METROCREAM) 0.75 % cream Apply 1 application topically daily as needed (rosacea).   . mometasone (ELOCON) 0.1 % cream Apply 1 application topically daily as needed (itchy ears).   . Multiple Vitamin (MULTIVITAMIN) tablet Take 1 tablet by mouth daily.    Marland Kitchen olopatadine (PATANOL) 0.1 %  ophthalmic solution Place 1 drop into both eyes 2 (two) times daily as needed for allergies.    No facility-administered encounter medications on file as of 12/28/2019.    Allergies (verified) Patient has no known allergies.   History: Past Medical History:  Diagnosis Date  . Cataract   . DDD (degenerative disc disease), cervical 2006  . Depression 1/06   resolved  . Diverticulitis   . Diverticulosis of colon 12/28/2019   Multiple large mouth colonoscopy finding  . Dysphonia   . Frequent PVCs   . Hyperlipidemia   . Hypothyroidism   . Nocturnal leg cramps 06/06/2014  . Non-seasonal allergic rhinitis   . Osteoporosis 2017  . SUI (stress urinary incontinence, female) 12/07  . Vitamin D deficiency disease 11/07   Past Surgical History:  Procedure Laterality Date  . BREAST EXCISIONAL BIOPSY Right 1991   benign  . COMBINED HYSTERECTOMY VAGINAL W/ MMK / A&P REPAIR  1983  . HAND SURGERY Left 10/2009   S-L   . LEFT HEART CATH AND CORONARY ANGIOGRAPHY N/A 08/31/2019   Procedure: LEFT HEART CATH AND CORONARY ANGIOGRAPHY;  Surgeon: Martinique, Peter M, MD;  Location: [facility];  Service: Cardiovascular;  Laterality: N/A;  . LIPOSUCTION    . TONSILLECTOMY  as child  . WISDOM TOOTH EXTRACTION     Family History  Problem Relation Age of Onset  .  Cancer Mother        cervical/breast  . Osteoarthritis Mother   . Dementia Mother   . Breast cancer Mother 21  . Endometriosis Sister   . Heart disease Brother   . Stroke Brother   . Stroke Maternal Grandmother    Social History   Socioeconomic History  . Marital status: Married    Spouse name: Not on file  . Number of children: 2  . Years of education: college-1  . Highest education level: Not on file  Occupational History  . Occupation: Retired     Fish farm manager: [employer]  Tobacco Use  . Smoking status: Former Smoker    Packs/day: 0.25    Years: 8.00    Pack years: 2.00    Quit date: 11/19/1971    Years since  quitting: 48.1  . Smokeless tobacco: Never Used  Substance and Sexual Activity  . Alcohol use: Yes    Comment: socially  . Drug use: No  . Sexual activity: Not Currently    Partners: Male    Birth control/protection: Surgical, Post-menopausal    Comment: hysterectomy  Other Topics Concern  . Not on file  Social History Narrative   Recently retired Feb 2021   Social Determinants of Health   Financial Resource Strain:   . Difficulty of Paying Living Expenses: Not on file  Food Insecurity:   . Worried About Charity fundraiser in the Last Year: Not on file  . Ran Out of Food in the Last Year: Not on file  Transportation Needs:   . Lack of Transportation (Medical): Not on file  . Lack of Transportation (Non-Medical): Not on file  Physical Activity:   . Days of Exercise per Week: Not on file  . Minutes of Exercise per Session: Not on file  Stress:   . Feeling of Stress : Not on file  Social Connections:   . Frequency of Communication with Friends and Family: Not on file  . Frequency of Social Gatherings with Friends and Family: Not on file  . Attends Religious Services: Not on file  . Active Member of Clubs or Organizations: Not on file  . Attends Archivist Meetings: Not on file  . Marital Status: Not on file    Tobacco Counseling Counseling given: Not Answered   Clinical Intake:  Pre-visit preparation completed: Yes  Pain : No/denies pain  Diabetes: No  How often do you need to have someone help you when you read instructions, pamphlets, or other written materials from your doctor or pharmacy?: 1 - Never  Interpreter Needed?: No  Information entered by :: Denman George LPN   Activities of Daily Living In your present state of health, do you have any difficulty performing the following activities: 12/28/2019  Hearing? N  Vision? N  Difficulty concentrating or making decisions? N  Walking or climbing stairs? N  Dressing or bathing? N  Doing  errands, shopping? N  Preparing Food and eating ? N  Using the Toilet? N  In the past six months, have you accidently leaked urine? N  Do you have problems with loss of bowel control? N  Managing your Medications? N  Managing your Finances? N  Housekeeping or managing your Housekeeping? N  Some recent data might be hidden     Immunizations and Health Maintenance Immunization History  Administered Date(s) Administered  . Influenza, High Dose Seasonal PF 08/22/2017, 08/31/2018, 09/08/2019  . Influenza-Unspecified 08/22/2017  . Pneumococcal Conjugate-13 11/02/2018  .  Td 02/25/2019  . Zoster 04/18/2012   Health Maintenance Due  Topic Date Due  . PNA vac Low Risk Adult (2 of 2 - PPSV23) 11/03/2019    Patient Care Team: Leamon Arnt, MD as PCP - General (Family Medicine) Bo Merino, MD as Consulting Physician (Rheumatology) [facility] as Consulting Physician (Otolaryngology) Merrilee Seashore, MD as Consulting Physician (Internal Medicine) Nicki Reaper, Chipper Oman, PA-C (Inactive) as Physician Assistant (Hematology and Oncology) Juanita Craver, MD as Consulting Physician (Gastroenterology) Evans Lance, MD as Consulting Physician (Cardiology) Jodell Cipro, CMA as Consulting Physician (Cardiology) Dorothy Spark, MD as Consulting Physician (Cardiology) Salvadore Dom, MD as Consulting Physician (Obstetrics and Gynecology) Sydnee Levans, MD as Consulting Physician (Dermatology) Kathrynn Ducking, MD as Consulting Physician (Neurology)  Indicate any recent Medical Services you may have received from other than [facility] providers in the past year (date may be approximate).     Assessment:   This is a routine wellness examination for [facility].  Hearing/Vision screen No exam data present  Dietary issues and exercise activities discussed: Current Exercise Habits: Home exercise routine, Type of exercise: walking, Time (Minutes): 30,  Frequency (Times/Week): 5, Weekly Exercise (Minutes/Week): 150, Intensity: Mild  Goals    . Patient Stated     Stay active after retirement and learn new hobbies       Depression Screen PHQ 2/9 Scores 12/28/2019 09/06/2019  PHQ - 2 Score 0 1  PHQ- 9 Score - 1    Fall Risk Fall Risk  12/28/2019 02/20/2018  Falls in the past year? 1 Yes  Number falls in past yr: 0 1  Injury with Fall? 0 No  Risk for fall due to : - Other (Comment)  Follow up Falls evaluation completed;Education provided Falls prevention discussed    Is the patient's home free of loose throw rugs in walkways, pet beds, electrical cords, etc?   yes      Grab bars in the bathroom? yes      Handrails on the stairs?   yes      Adequate lighting?   yes  Timed Get Up and Go Performed completed and within normal timeframe; no gait abnormalities noted   Cognitive Function:   Cognitive Testing  Alert? Yes         Normal Appearance? Yes Oriented to person? Yes           Place? Yes  Time? Yes  Recall of three objects? Yes  Can perform simple calculations? Yes  Displays appropriate judgment? Yes  Can read the correct time from a watch face? Yes   Screening Tests Health Maintenance  Topic Date Due  . PNA vac Low Risk Adult (2 of 2 - PPSV23) 11/03/2019  . DEXA SCAN  07/23/2020  . MAMMOGRAM  09/27/2020  . TETANUS/TDAP  02/24/2029  . COLONOSCOPY  07/13/2029  . INFLUENZA VACCINE  Completed  . Hepatitis C Screening  Completed    Qualifies for Shingles Vaccine? Discussed and patient will check with pharmacy for coverage.  Patient education handout provided   Cancer Screenings: Lung: Low Dose CT Chest recommended if Age 68-80 years, 30 pack-year currently smoking OR have quit w/in 15years. Patient does not qualify. Breast: Up to date on Mammogram? Yes   Up to date of Bone Density/Dexa? Yes Colorectal: colonoscopy 07/14/19 with Dr. Collene Mares    Plan:  I have personally reviewed and addressed the Medicare Annual Wellness  questionnaire and have noted the following in the  patient's chart:  A. Medical and social history B. Use of alcohol, tobacco or illicit drugs  C. Current medications and supplements D. Functional ability and status E.  Nutritional status F.  Physical activity G. Advance directives H. List of other physicians I.  Hospitalizations, surgeries, and ER visits in previous 12 months J.  [place] such as hearing and vision if needed, cognitive and depression L. Referrals, records requested, and appointments- none   In addition, I have reviewed and discussed with patient certain preventive protocols, quality metrics, and best practice recommendations. A written personalized care plan for preventive services as well as general preventive health recommendations were provided to patient.   Signed,  Denman George, LPN  Nurse Health Advisor   Nurse Notes: no additional

## 2019-12-28 NOTE — Patient Instructions (Addendum)
Please return in October 2021 for your annual complete physical; please come fasting.  Please take the prescription for Pneumovax to the pharmacy for your vaccination.   Try red yeast rice to help your cholesterol.   If you have any questions or concerns, please don't hesitate to send me a message via MyChart or call the office at [phone]. Thank you for visiting with Sharon Harrell today! It's our pleasure caring for you.   Overactive Bladder, Adult  Overactive bladder refers to a condition in which a person has a sudden need to pass urine. The person may leak urine if he or she cannot get to the bathroom fast enough (urinary incontinence). A person with this condition may also wake up several times in the night to go to the bathroom. Overactive bladder is associated with poor nerve signals between your bladder and your brain. Your bladder may get the signal to empty before it is full. You may also have very sensitive muscles that make your bladder squeeze too soon. These symptoms might interfere with daily work or social activities. What are the causes? This condition may be associated with or caused by:  Urinary tract infection.  Infection of nearby tissues, such as the prostate.  Prostate enlargement.  Surgery on the uterus or urethra.  Bladder stones, inflammation, or tumors.  Drinking too much caffeine or alcohol.  Certain medicines, especially medicines that get rid of extra fluid in the body (diuretics).  Muscle or nerve weakness, especially from: ? A spinal cord injury. ? Stroke. ? Multiple sclerosis. ? Parkinson's disease.  Diabetes.  Constipation. What increases the risk? You may be at greater risk for overactive bladder if you:  Are an older adult.  Smoke.  Are going through menopause.  Have prostate problems.  Have a neurological disease, such as stroke, dementia, Parkinson's disease, or multiple sclerosis (MS).  Eat or drink things that irritate the bladder.  These include alcohol, spicy food, and caffeine.  Are overweight or obese. What are the signs or symptoms? Symptoms of this condition include:  Sudden, strong urge to urinate.  Leaking urine.  Urinating 8 or more times a day.  Waking up to urinate 2 or more times a night. How is this diagnosed? Your health care provider may suspect overactive bladder based on your symptoms. He or she will diagnose this condition by:  A physical exam and medical history.  Blood or urine tests. You might need bladder or urine tests to help determine what is causing your overactive bladder. You might also need to see a health care provider who specializes in urinary tract problems (urologist). How is this treated? Treatment for overactive bladder depends on the cause of your condition and whether it is mild or severe. You can also make lifestyle changes at home. Options include:  Bladder training. This may include: ? Learning to control the urge to urinate by following a schedule that directs you to urinate at regular intervals (timed voiding). ? Doing Kegel exercises to strengthen your pelvic floor muscles, which support your bladder. Toning these muscles can help you control urination, even if your bladder muscles are overactive.  Special devices. This may include: ? Biofeedback, which uses sensors to help you become aware of your body's signals. ? Electrical stimulation, which uses electrodes placed inside the body (implanted) or outside the body. These electrodes send gentle pulses of electricity to strengthen the nerves or muscles that control the bladder. ? Women may use a plastic device that fits into the  vagina and supports the bladder (pessary).  Medicines. ? Antibiotics to treat bladder infection. ? Antispasmodics to stop the bladder from releasing urine at the wrong time. ? Tricyclic antidepressants to relax bladder muscles. ? Injections of botulinum toxin type A directly into the bladder  tissue to relax bladder muscles.  Lifestyle changes. This may include: ? Weight loss. Talk to your health care provider about weight loss methods that would work best for you. ? Diet changes. This may include reducing how much alcohol and caffeine you consume, or drinking fluids at different times of the day. ? Not smoking. Do not use any products that contain nicotine or tobacco, such as cigarettes and e-cigarettes. If you need help quitting, ask your health care provider.  Surgery. ? A device may be implanted to help manage the nerve signals that control urination. ? An electrode may be implanted to stimulate electrical signals in the bladder. ? A procedure may be done to change the shape of the bladder. This is done only in very severe cases. Follow these instructions at home: Lifestyle  Make any diet or lifestyle changes that are recommended by your health care provider. These may include: ? Drinking less fluid or drinking fluids at different times of the day. ? Cutting down on caffeine or alcohol. ? Doing Kegel exercises. ? Losing weight if needed. ? Eating a healthy and balanced diet to prevent constipation. This may include:  Eating foods that are high in fiber, such as fresh fruits and vegetables, whole grains, and beans.  Limiting foods that are high in fat and processed sugars, such as fried and sweet foods. General instructions  Take over-the-counter and prescription medicines only as told by your health care provider.  If you were prescribed an antibiotic medicine, take it as told by your health care provider. Do not stop taking the antibiotic even if you start to feel better.  Use any implants or pessary as told by your health care provider.  If needed, wear pads to absorb urine leakage.  Keep a journal or log to track how much and when you drink and when you feel the need to urinate. This will help your health care provider monitor your condition.  Keep all follow-up  visits as told by your health care provider. This is important. Contact a health care provider if:  You have a fever.  Your symptoms do not get better with treatment.  Your pain and discomfort get worse.  You have more frequent urges to urinate. Get help right away if:  You are not able to control your bladder. Summary  Overactive bladder refers to a condition in which a person has a sudden need to pass urine.  Several conditions may lead to an overactive bladder.  Treatment for overactive bladder depends on the cause and severity of your condition.  Follow your health care provider's instructions about lifestyle changes, doing Kegel exercises, keeping a journal, and taking medicines. This information is not intended to replace advice given to you by your health care provider. Make sure you discuss any questions you have with your health care provider. Document Revised: 02/25/2019 Document Reviewed: 11/20/2017 Elsevier Patient Education  [place].

## 2020-01-23 IMAGING — MG DIGITAL SCREENING BILAT W/ TOMO W/ CAD
8 series · 9 of 24 positions shown · non-contrast
Comparison: Previous exam(s).

CLINICAL DATA: Screening.

EXAM:
DIGITAL SCREENING BILATERAL MAMMOGRAM WITH TOMO AND CAD

[L CC synth-2D]
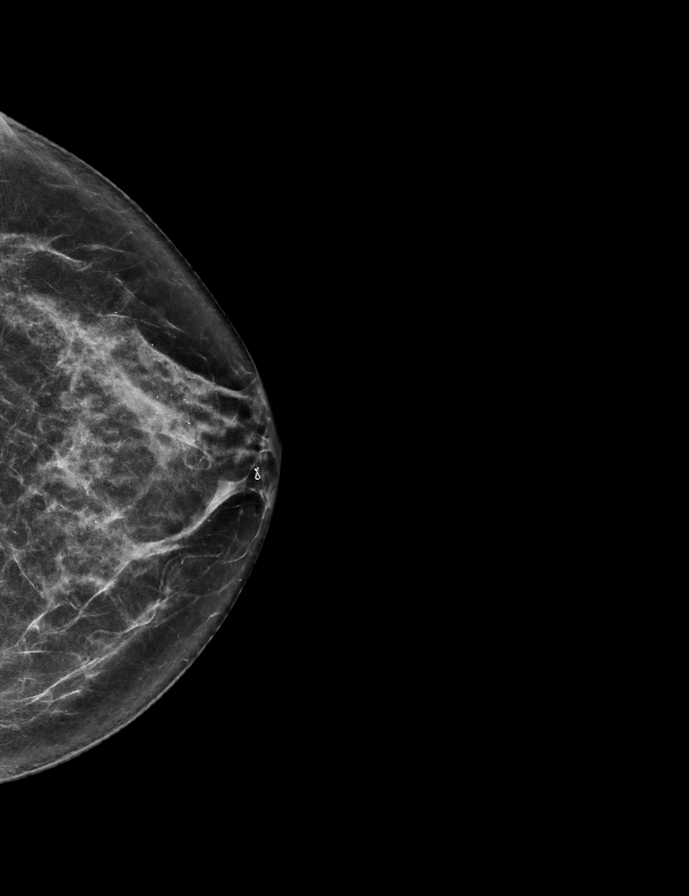

[R CC synth-2D]
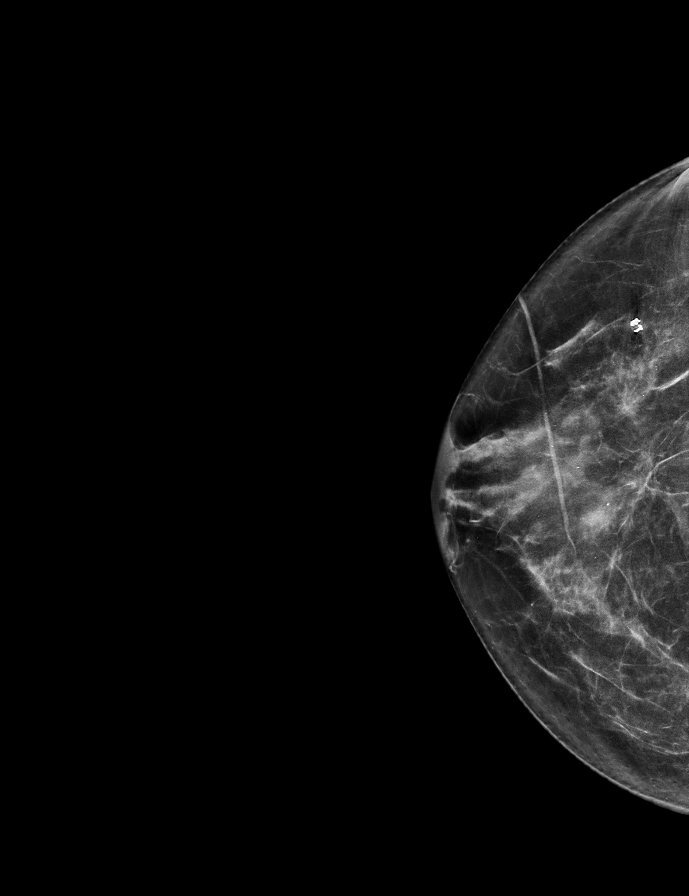

[R MLO synth-2D]
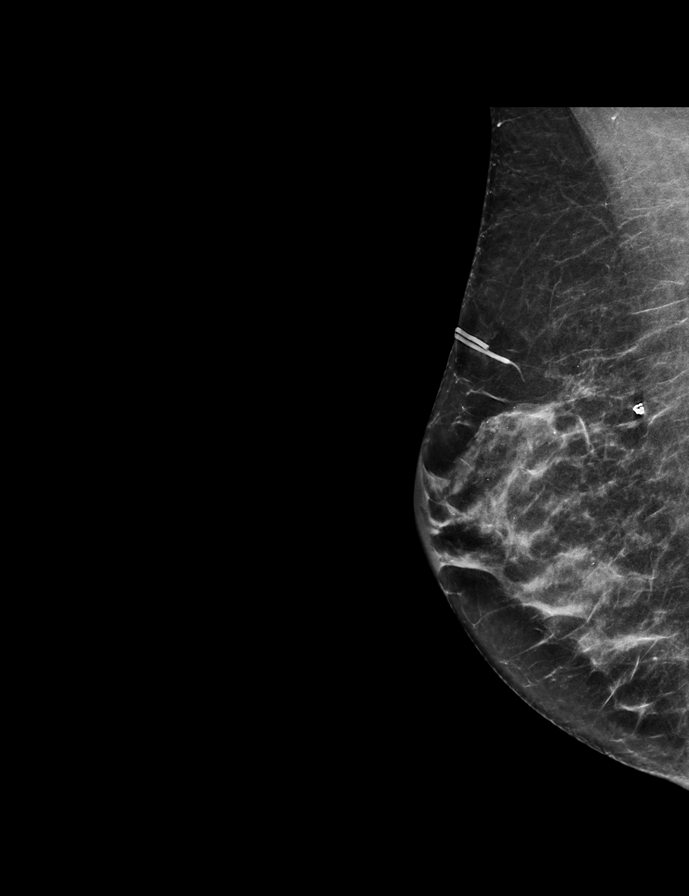

[L MLO synth-2D]
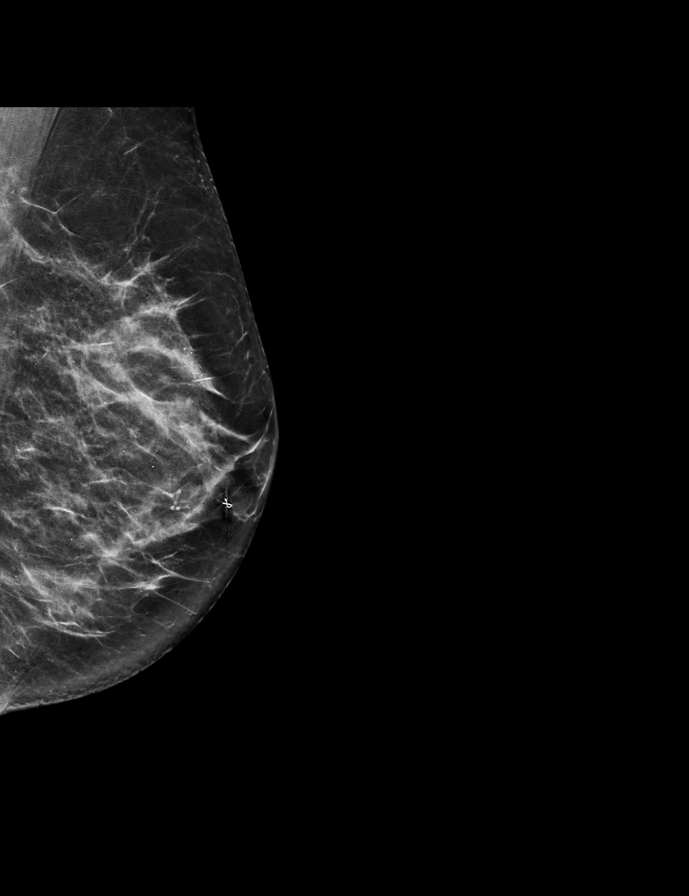

[L CC tomo · 2 of 63 frames shown]
[frame 21/63]
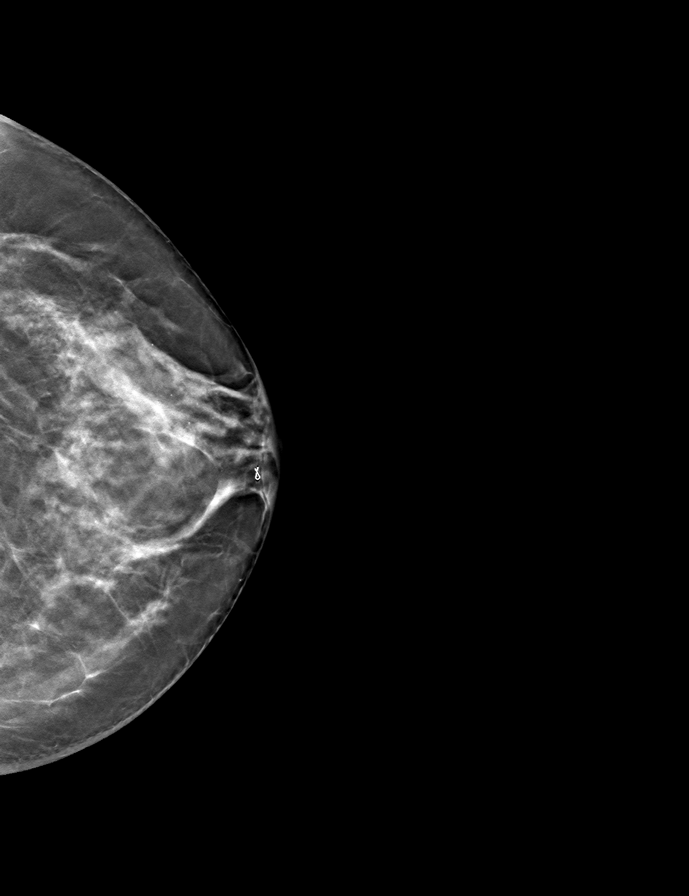
[frame 32/63]
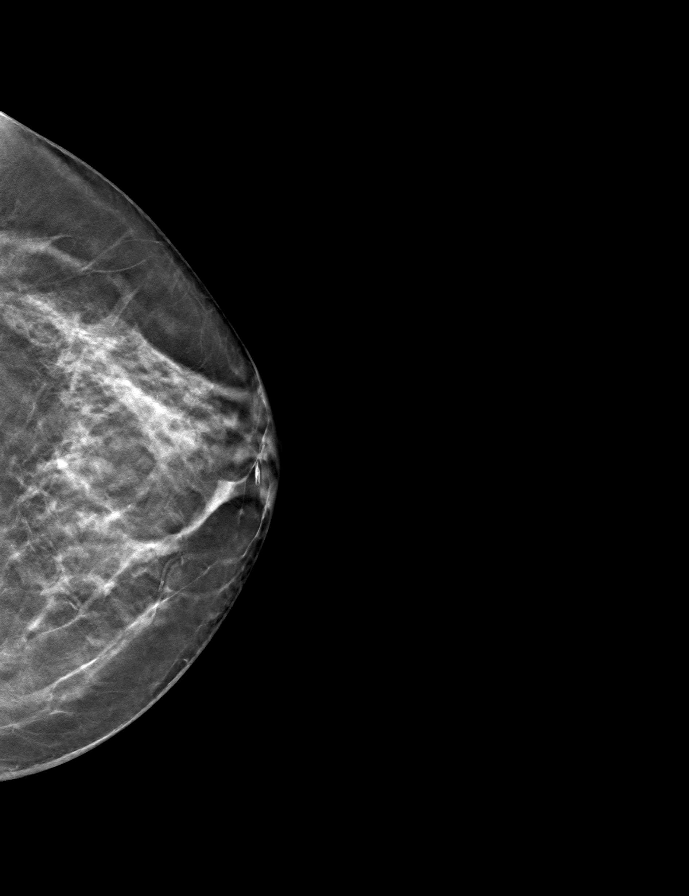

[L MLO tomo · tomo slice 33/65.0]
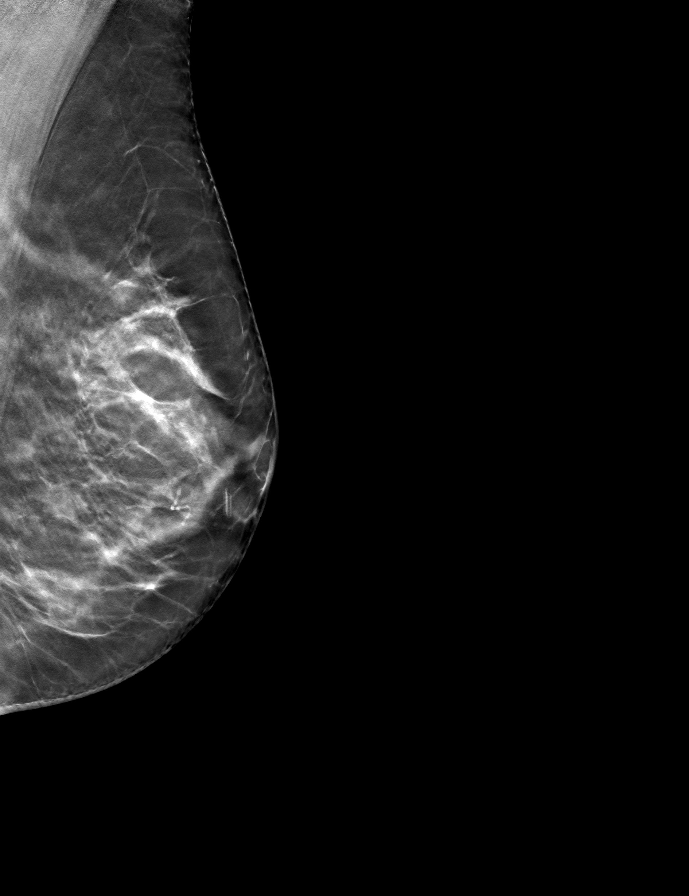

[R MLO tomo · tomo slice 31/60.0]
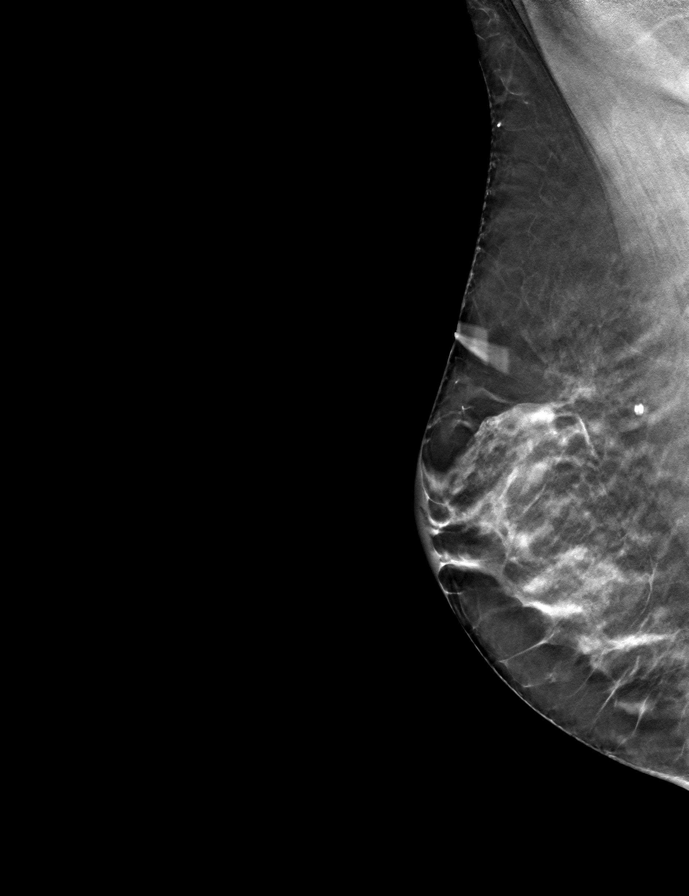

[R CC tomo · tomo slice 31/62.0]
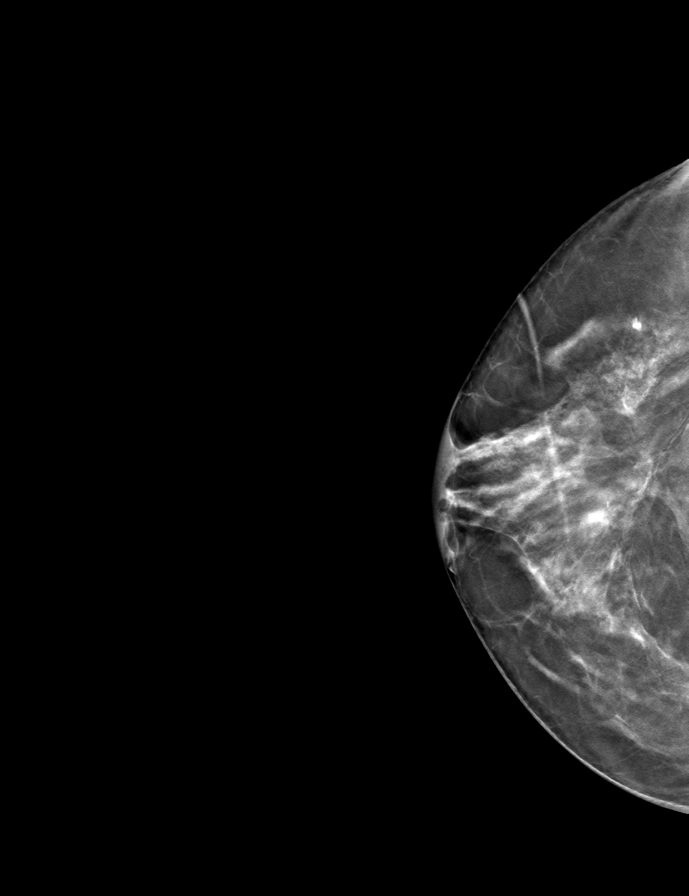

[9 of 24 positions shown; findings below may reference images not displayed]

ACR Breast Density Category c: The breast tissue is heterogeneously
dense, which may obscure small masses.
FINDINGS: There are no findings suspicious for malignancy. Images were
processed with CAD.
IMPRESSION: No mammographic evidence of malignancy. A result letter of this
screening mammogram will be mailed directly to the patient.

RECOMMENDATION:
Screening mammogram in one year. (Code:[id number])

BI-RADS CATEGORY  1: Negative.

## 2020-01-29 ENCOUNTER — Ambulatory Visit: Payer: Medicare Other | Attending: Internal Medicine

## 2020-01-29 DIAGNOSIS — Z23 Encounter for immunization: Secondary | ICD-10-CM

## 2020-01-29 NOTE — Progress Notes (Signed)
   Covid-19 Vaccination Clinic  Name:  Sharon Harrell    MRN: [id number] DOB: [birth year]  01/29/2020  [name] was observed post Covid-19 immunization for 15 minutes without incident. She was provided with Vaccine Information Sheet and instruction to access the V-Safe system.   [name] was instructed to call 911 with any severe reactions post vaccine: Marland Kitchen Difficulty breathing  . Swelling of face and throat  . A fast heartbeat  . A bad rash all over body  . Dizziness and weakness   Immunizations Administered    Name Date Dose VIS Date Route   Pfizer COVID-19 Vaccine 01/29/2020 10:22 AM 0.3 mL 10/29/2019 Intramuscular   Manufacturer: [place]   Lot: [id number]   [place]: [id number]

## 2020-02-08 ENCOUNTER — Encounter: Payer: Self-pay | Admitting: Certified Nurse Midwife

## 2020-02-23 ENCOUNTER — Ambulatory Visit: Payer: Medicare Other | Attending: Internal Medicine

## 2020-02-23 DIAGNOSIS — Z23 Encounter for immunization: Secondary | ICD-10-CM

## 2020-02-23 NOTE — Progress Notes (Signed)
   Covid-19 Vaccination Clinic  Name:  Sharon Harrell    MRN: [id number] DOB: [birth year]  02/23/2020  [name] was observed post Covid-19 immunization for 15 minutes without incident. She was provided with Vaccine Information Sheet and instruction to access the V-Safe system.   [name] was instructed to call 911 with any severe reactions post vaccine: Marland Kitchen Difficulty breathing  . Swelling of face and throat  . A fast heartbeat  . A bad rash all over body  . Dizziness and weakness   Immunizations Administered    Name Date Dose VIS Date Route   Pfizer COVID-19 Vaccine 02/23/2020 10:04 AM 0.3 mL 10/29/2019 Intramuscular   Manufacturer: [employer]   Lot: [id number]   [place]: [id number]

## 2020-02-28 ENCOUNTER — Ambulatory Visit: Payer: Medicare Other

## 2020-03-09 ENCOUNTER — Other Ambulatory Visit: Payer: Self-pay

## 2020-03-09 DIAGNOSIS — M810 Age-related osteoporosis without current pathological fracture: Secondary | ICD-10-CM

## 2020-03-09 MED ORDER — ALENDRONATE SODIUM 70 MG PO TABS: ORAL_TABLET | ORAL | 0 refills | Status: DC

## 2020-03-17 ENCOUNTER — Encounter: Payer: Self-pay | Admitting: Family Medicine

## 2020-03-20 ENCOUNTER — Other Ambulatory Visit: Payer: Self-pay

## 2020-03-20 MED ORDER — LEVOTHYROXINE SODIUM 75 MCG PO TABS: 75.0000 ug | ORAL_TABLET | Freq: Every day | ORAL | 3 refills | Status: DC

## 2020-04-04 ENCOUNTER — Encounter: Payer: Self-pay | Admitting: Family Medicine

## 2020-04-05 NOTE — Telephone Encounter (Signed)
Please call pt and schedule her an appt.

## 2020-04-05 NOTE — Telephone Encounter (Signed)
Patient has been scheduled for 04/06/20 @ 9am

## 2020-04-06 ENCOUNTER — Ambulatory Visit (INDEPENDENT_AMBULATORY_CARE_PROVIDER_SITE_OTHER): Payer: Medicare Other | Admitting: Family Medicine

## 2020-04-06 ENCOUNTER — Encounter: Payer: Self-pay | Admitting: Family Medicine

## 2020-04-06 ENCOUNTER — Other Ambulatory Visit: Payer: Self-pay

## 2020-04-06 VITALS — BP 110/60 | HR 71 | Temp 98.0°F | Resp 18 | Ht 66.0 in | Wt 131.4 lb

## 2020-04-06 DIAGNOSIS — K5900 Constipation, unspecified: Secondary | ICD-10-CM | POA: Diagnosis not present

## 2020-04-06 DIAGNOSIS — R350 Frequency of micturition: Secondary | ICD-10-CM | POA: Diagnosis not present

## 2020-04-06 LAB — POCT URINALYSIS DIPSTICK
Bilirubin, UA: NEGATIVE
Blood, UA: NEGATIVE
Glucose, UA: NEGATIVE
Ketones, UA: NEGATIVE
Leukocytes, UA: NEGATIVE
Nitrite, UA: NEGATIVE
Protein, UA: NEGATIVE
Spec Grav, UA: 1.025 (ref 1.010–1.025)
Urobilinogen, UA: 0.2 E.U./dL
pH, UA: 6 (ref 5.0–8.0)

## 2020-04-06 NOTE — Progress Notes (Signed)
Subjective   CC:  Chief Complaint  Patient presents with  . Urinary Tract Infection    Started Monday night. No pain just discomfort.     HPI: Sharon Harrell is a 69 y.o. female who presents to the office today to address the problems listed above in the chief complaint.  Patient with history of constipation and some mildly progressive OAB sxs reports mild urinary discomfort and darker urine with some increase in urge sxs. Leaving for vacation today and wants to be sure there is no infection. Otherwise feels well.  She denies fevers flank pain nausea vomiting or gross hematuria.  Symptoms have been present for several days.  She denies history of interstitial cystitis.  She denies vaginal symptoms including vaginal discharge or pelvic pain. She reports no incontinence and does feels that her OAB sxs are mild.  Constipation: passes small hard firm stools w/o blood or pain. Rarely uses a stool softener.  Assessment  1. Urine frequency   2. Constipation, unspecified constipation type      Plan   OAB and concentrated urine: education given. Hydrate better and monitor oab sxs. No infection at this time. F/u if worsens.  Treat constipation with miralax as needed   Follow up: 08/28/2020   Orders Placed This Encounter  Procedures  . POCT urinalysis dipstick   No orders of the defined types were placed in this encounter.     I reviewed the patients updated PMH, FH, and SocHx.    Patient Active Problem List   Diagnosis Date Noted  . Acquired hypothyroidism 12/28/2019    Priority: High  . Mixed hyperlipidemia 08/31/2019    Priority: High  . Frequent PVCs 10/07/2018    Priority: High  . Osteoporosis 07/16/2017    Priority: High  . Esophageal dysphagia 07/01/2019    Priority: Medium  . Gastroesophageal reflux disease 07/01/2019    Priority: Medium  . Chronic idiopathic constipation 07/01/2019    Priority: Medium  . Family history of breast cancer 02/21/2018    Priority:  Medium  . Laryngopharyngeal reflux (LPR) 07/16/2017    Priority: Medium  . Allergic conjunctivitis 12/28/2019    Priority: Low  . Diverticulosis of colon 12/28/2019    Priority: Low  . Rosacea 12/28/2019    Priority: Low  . Chronic allergic rhinitis 02/21/2018    Priority: Low  . Hepatic cyst 02/21/2018    Priority: Low  . Nocturnal leg cramps 06/06/2014    Priority: Low  . Vitreous degeneration of both eyes 02/18/2019   Current Meds  Medication Sig  . alendronate (FOSAMAX) 70 MG tablet TAKE 1 TABLET BY MOUTH EVERY 7 DAYS. TAKE WITH A FULL GLASS OF WATER ON AN EMPTY STOMACH.  Marland Kitchen Cholecalciferol (VITAMIN D-3) 5000 UNITS TABS Take 4,000 Units by mouth daily.   . [employer] MAGNESIUM CITRATE PO Take 250 mg by mouth daily.   . famotidine (PEPCID) 20 MG tablet Take 20 mg by mouth at bedtime.   . fluticasone (FLONASE) 50 MCG/ACT nasal spray Place 1 spray into both nostrils daily as needed for allergies.   Marland Kitchen levothyroxine (SYNTHROID) 75 MCG tablet Take 1 tablet (75 mcg total) by mouth daily.  . metoprolol succinate (TOPROL XL) 25 MG 24 hr tablet Take 1 tablet (25 mg total) by mouth daily.  . metroNIDAZOLE (METROCREAM) 0.75 % cream Apply 1 application topically daily as needed (rosacea).   . mometasone (ELOCON) 0.1 % cream Apply 1 application topically daily as needed (itchy ears).   Marland Kitchen  Multiple Vitamin (MULTIVITAMIN) tablet Take 1 tablet by mouth daily.    Marland Kitchen olopatadine (PATANOL) 0.1 % ophthalmic solution Place 1 drop into both eyes 2 (two) times daily as needed for allergies.   . Red Yeast Rice 600 MG CAPS Take 1,200 mg by mouth daily.    Review of Systems: Cardiovascular: negative for chest pain Respiratory: negative for SOB or persistent cough Gastrointestinal: negative for abdominal pain Constitutional: Negative for fever malaise or anorexia  Objective  Vitals: BP 110/60   Pulse 71   Temp 98 F (36.7 C) (Temporal)   Resp 18   Ht 5\' 6"  (1.676 m)   Wt 131 lb 6.4 oz (59.6 kg)   LMP  08/18/1982 (Approximate)   SpO2 97%   BMI 21.21 kg/m  General: no acute distress  Psych:  Alert and oriented, normal mood and affect Gastrointestinal: soft, flat abdomen, normal active bowel sounds, no palpable masses, no hepatosplenomegaly, no appreciated hernias, NO CVAT, no suprapubic ttp w/o rebound or guarding Skin:  Warm, no rashes Neurologic:   Mental status is normal. normal gait Office Visit on 04/06/2020  Component Date Value Ref Range Status  . Color, UA 04/06/2020 yellow   Final  . Clarity, UA 04/06/2020 clear   Final  . Glucose, UA 04/06/2020 Negative  Negative Final  . Bilirubin, UA 04/06/2020 Negative   Final  . Ketones, UA 04/06/2020 Negative   Final  . Spec Grav, UA 04/06/2020 1.025  1.010 - 1.025 Final  . Blood, UA 04/06/2020 Negative   Final  . pH, UA 04/06/2020 6.0  5.0 - 8.0 Final  . Protein, UA 04/06/2020 Negative  Negative Final  . Urobilinogen, UA 04/06/2020 0.2  0.2 or 1.0 E.U./dL Final  . Nitrite, UA 04/06/2020 Negative   Final  . Leukocytes, UA 04/06/2020 Negative  Negative Final    Commons side effects, risks, benefits, and alternatives for medications and treatment plan prescribed today were discussed, and the patient expressed understanding of the given instructions. Patient is instructed to call or message via MyChart if he/she has any questions or concerns regarding our treatment plan. No barriers to understanding were identified. We discussed Red Flag symptoms and signs in detail. Patient expressed understanding regarding what to do in case of urgent or emergency type symptoms.   Medication list was reconciled, printed and provided to the patient in AVS. Patient instructions and summary information was reviewed with the patient as documented in the AVS. This note was prepared with assistance of Dragon voice recognition software. Occasional wrong-word or sound-a-like substitutions may have occurred due to the inherent limitations of voice recognition  software

## 2020-04-06 NOTE — Patient Instructions (Signed)
Please follow up as scheduled for your next visit with me: 08/30/2020 for your annual exam.  Your urine looks fine but is a little dehydrated. This can be contributing to your symptoms. Try to drink more water.  Also treat your constipation with miralax daily as needed and/or a stool softener. Let me know if your bladder symptoms worsen any.   Have a great trip and hope your sister-in-law enjoys her time with you!  If you have any questions or concerns, please don't hesitate to send me a message via MyChart or call the office at [phone]. Thank you for visiting with [place] today! It's our pleasure caring for you.

## 2020-04-14 ENCOUNTER — Encounter: Payer: Self-pay | Admitting: Family Medicine

## 2020-04-14 NOTE — Telephone Encounter (Signed)
Called pt and scheduled appt

## 2020-04-14 NOTE — Telephone Encounter (Signed)
Please see message and call pt and schedule.

## 2020-04-20 ENCOUNTER — Ambulatory Visit (INDEPENDENT_AMBULATORY_CARE_PROVIDER_SITE_OTHER): Payer: Medicare Other | Admitting: Family Medicine

## 2020-04-20 ENCOUNTER — Encounter: Payer: Self-pay | Admitting: Family Medicine

## 2020-04-20 ENCOUNTER — Other Ambulatory Visit: Payer: Self-pay

## 2020-04-20 VITALS — BP 118/70 | HR 67 | Temp 97.4°F | Resp 14 | Ht 66.0 in | Wt 131.8 lb

## 2020-04-20 DIAGNOSIS — M19041 Primary osteoarthritis, right hand: Secondary | ICD-10-CM

## 2020-04-20 NOTE — Patient Instructions (Signed)
Please follow up as scheduled for your next visit with me: 08/30/2020   If you have any questions or concerns, please don't hesitate to send me a message via MyChart or call the office at [phone]. Thank you for visiting with [place] today! It's our pleasure caring for you.   Osteoarthritis  Osteoarthritis is a type of arthritis that affects tissue that covers the ends of bones in joints (cartilage). Cartilage acts as a cushion between the bones and helps them move smoothly. Osteoarthritis results when cartilage in the joints gets worn down. Osteoarthritis is sometimes called "wear and tear" arthritis. Osteoarthritis is the most common form of arthritis. It often occurs in older people. It is a condition that gets worse over time (a progressive condition). Joints that are most often affected by this condition are in:  Fingers.  Toes.  Hips.  Knees.  Spine, including neck and lower back. What are the causes? This condition is caused by age-related wearing down of cartilage that covers the ends of bones. What increases the risk? The following factors may make you more likely to develop this condition:  Older age.  Being overweight or obese.  Overuse of joints, such as in athletes.  Past injury of a joint.  Past surgery on a joint.  Family history of osteoarthritis. What are the signs or symptoms? The main symptoms of this condition are pain, swelling, and stiffness in the joint. The joint may lose its shape over time. Small pieces of bone or cartilage may break off and float inside of the joint, which may cause more pain and damage to the joint. Small deposits of bone (osteophytes) may grow on the edges of the joint. Other symptoms may include:  A grating or scraping feeling inside the joint when you move it.  Popping or creaking sounds when you move. Symptoms may affect one or more joints. Osteoarthritis in a major joint, such as your knee or hip, can make it painful to walk or  exercise. If you have osteoarthritis in your hands, you might not be able to grip items, twist your hand, or control small movements of your hands and fingers (fine motor skills). How is this diagnosed? This condition may be diagnosed based on:  Your medical history.  A physical exam.  Your symptoms.  X-rays of the affected joint(s).  Blood tests to rule out other types of arthritis. How is this treated? There is no cure for this condition, but treatment can help to control pain and improve joint function. Treatment plans may include:  A prescribed exercise program that allows for rest and joint relief. You may work with a physical therapist.  A weight control plan.  Pain relief techniques, such as: ? Applying heat and cold to the joint. ? Electric pulses delivered to nerve endings under the skin (transcutaneous electrical nerve stimulation, or TENS). ? Massage. ? Certain nutritional supplements.  NSAIDs or prescription medicines to help relieve pain.  Medicine to help relieve pain and inflammation (corticosteroids). This can be given by mouth (orally) or as an injection.  Assistive devices, such as a brace, wrap, splint, specialized glove, or cane.  Surgery, such as: ? An osteotomy. This is done to reposition the bones and relieve pain or to remove loose pieces of bone and cartilage. ? Joint replacement surgery. You may need this surgery if you have very bad (advanced) osteoarthritis. Follow these instructions at home: Activity  Rest your affected joints as directed by your health care provider.  Do  not drive or use heavy machinery while taking prescription pain medicine.  Exercise as directed. Your health care provider or physical therapist may recommend specific types of exercise, such as: ? Strengthening exercises. These are done to strengthen the muscles that support joints that are affected by arthritis. They can be performed with weights or with exercise bands to add  resistance. ? Aerobic activities. These are exercises, such as brisk walking or water aerobics, that get your heart pumping. ? Range-of-motion activities. These keep your joints easy to move. ? Balance and agility exercises. Managing pain, stiffness, and swelling      If directed, apply heat to the affected area as often as told by your health care provider. Use the heat source that your health care provider recommends, such as a moist heat pack or a heating pad. ? If you have a removable assistive device, remove it as told by your health care provider. ? Place a towel between your skin and the heat source. If your health care provider tells you to keep the assistive device on while you apply heat, place a towel between the assistive device and the heat source. ? Leave the heat on for 20-30 minutes. ? Remove the heat if your skin turns bright red. This is especially important if you are unable to feel pain, heat, or cold. You may have a greater risk of getting burned.  If directed, put ice on the affected joint: ? If you have a removable assistive device, remove it as told by your health care provider. ? Put ice in a plastic bag. ? Place a towel between your skin and the bag. If your health care provider tells you to keep the assistive device on during icing, place a towel between the assistive device and the bag. ? Leave the ice on for 20 minutes, 2-3 times a day. General instructions  Take over-the-counter and prescription medicines only as told by your health care provider.  Maintain a healthy weight. Follow instructions from your health care provider for weight control. These may include dietary restrictions.  Do not use any products that contain nicotine or tobacco, such as cigarettes and e-cigarettes. These can delay bone healing. If you need help quitting, ask your health care provider.  Use assistive devices as directed by your health care provider.  Keep all follow-up visits as  told by your health care provider. This is important. Where to find more information  [employer] of Arthritis and Musculoskeletal and Skin Diseases: www.niams.South[employer].es  [employer] on Aging: http://kim-miller.com/  American College of Rheumatology: www.rheumatology.org Contact a health care provider if:  Your skin turns red.  You develop a rash.  You have pain that gets worse.  You have a fever along with joint or muscle aches. Get help right away if:  You lose a lot of weight.  You suddenly lose your appetite.  You have night sweats. Summary  Osteoarthritis is a type of arthritis that affects tissue covering the ends of bones in joints (cartilage).  This condition is caused by age-related wearing down of cartilage that covers the ends of bones.  The main symptom of this condition is pain, swelling, and stiffness in the joint.  There is no cure for this condition, but treatment can help to control pain and improve joint function. This information is not intended to replace advice given to you by your health care provider. Make sure you discuss any questions you have with your health care provider. Document Revised: 10/17/2017  Document Reviewed: 07/08/2016 Elsevier Patient Education  [employer].

## 2020-04-20 NOTE — Progress Notes (Signed)
Subjective  CC:  Chief Complaint  Patient presents with  . Arthritis    middle finger right hand, swelling, throbbing, redness. Started 6 weeks ago    HPI: Sharon Harrell is a 70 y.o. female who presents to the office today to address the problems listed above in the chief complaint.  Noted some discomfort in DIP of right middle finger several weeks ago. No known injury but keeps active. Became red and swollen and mildly tender but not needing anything for pain or keeping her from doing things. Has calmed down a bit. No other joint pain now.   Assessment  1. Primary osteoarthritis of right hand      Plan   OA:  Education given. Reassured. Mild discomfort; can use topical nsaids or otc advil/ tylenol IF needed. See avs.  Follow up: Return for as scheduled, complete physical.  08/28/2020  No orders of the defined types were placed in this encounter.  No orders of the defined types were placed in this encounter.     I reviewed the patients updated PMH, FH, and SocHx.    Patient Active Problem List   Diagnosis Date Noted  . Acquired hypothyroidism 12/28/2019    Priority: High  . Mixed hyperlipidemia 08/31/2019    Priority: High  . Frequent PVCs 10/07/2018    Priority: High  . Osteoporosis 07/16/2017    Priority: High  . Esophageal dysphagia 07/01/2019    Priority: Medium  . Gastroesophageal reflux disease 07/01/2019    Priority: Medium  . Chronic idiopathic constipation 07/01/2019    Priority: Medium  . Family history of breast cancer 02/21/2018    Priority: Medium  . Laryngopharyngeal reflux (LPR) 07/16/2017    Priority: Medium  . Allergic conjunctivitis 12/28/2019    Priority: Low  . Diverticulosis of colon 12/28/2019    Priority: Low  . Rosacea 12/28/2019    Priority: Low  . Chronic allergic rhinitis 02/21/2018    Priority: Low  . Hepatic cyst 02/21/2018    Priority: Low  . Nocturnal leg cramps 06/06/2014    Priority: Low  . Primary osteoarthritis of  right hand 04/20/2020  . Vitreous degeneration of both eyes 02/18/2019   Current Meds  Medication Sig  . alendronate (FOSAMAX) 70 MG tablet TAKE 1 TABLET BY MOUTH EVERY 7 DAYS. TAKE WITH A FULL GLASS OF WATER ON AN EMPTY STOMACH.  Marland Kitchen Cholecalciferol (VITAMIN D-3) 5000 UNITS TABS Take 4,000 Units by mouth daily.   . CVS MAGNESIUM CITRATE PO Take 250 mg by mouth daily.   . famotidine (PEPCID) 20 MG tablet Take 20 mg by mouth at bedtime.   . fish oil-omega-3 fatty acids 1000 MG capsule Take 1 g by mouth daily.    . fluticasone (FLONASE) 50 MCG/ACT nasal spray Place 1 spray into both nostrils daily as needed for allergies.   Marland Kitchen levothyroxine (SYNTHROID) 75 MCG tablet Take 1 tablet (75 mcg total) by mouth daily.  . metoprolol succinate (TOPROL XL) 25 MG 24 hr tablet Take 1 tablet (25 mg total) by mouth daily.  . metroNIDAZOLE (METROCREAM) 0.75 % cream Apply 1 application topically daily as needed (rosacea).   . mometasone (ELOCON) 0.1 % cream Apply 1 application topically daily as needed (itchy ears).   . Multiple Vitamin (MULTIVITAMIN) tablet Take 1 tablet by mouth daily.    Marland Kitchen olopatadine (PATANOL) 0.1 % ophthalmic solution Place 1 drop into both eyes 2 (two) times daily as needed for allergies.   . Red Yeast Rice 600  MG CAPS Take 1,200 mg by mouth daily.    Allergies: Patient has No Known Allergies. Family History: Patient family history includes Breast cancer (age of onset: 31) in her mother; Cancer in her mother; Dementia in her mother; Endometriosis in her sister; Heart disease in her brother; Osteoarthritis in her mother; Stroke in her brother and maternal grandmother. Social History:  Patient  reports that she quit smoking about 48 years ago. She has a 2.00 pack-year smoking history. She has never used smokeless tobacco. She reports current alcohol use. She reports that she does not use drugs.  Review of Systems: Constitutional: Negative for fever malaise or anorexia Cardiovascular:  negative for chest pain Respiratory: negative for SOB or persistent cough Gastrointestinal: negative for abdominal pain  Objective  Vitals: BP 118/70   Pulse 67   Temp (!) 97.4 F (36.3 C) (Temporal)   Resp 14   Ht 5\' 6"  (1.676 m)   Wt 131 lb 12.8 oz (59.8 kg)   LMP 08/18/1982 (Approximate)   SpO2 98%   BMI 21.27 kg/m  General: no acute distress , A&Ox3 Right hand, 3rd DIP with mild ttp,swelling w/o warmth. No other joint changes noted on bilateral hands     Commons side effects, risks, benefits, and alternatives for medications and treatment plan prescribed today were discussed, and the patient expressed understanding of the given instructions. Patient is instructed to call or message via MyChart if he/she has any questions or concerns regarding our treatment plan. No barriers to understanding were identified. We discussed Red Flag symptoms and signs in detail. Patient expressed understanding regarding what to do in case of urgent or emergency type symptoms.   Medication list was reconciled, printed and provided to the patient in AVS. Patient instructions and summary information was reviewed with the patient as documented in the AVS. This note was prepared with assistance of Dragon voice recognition software. Occasional wrong-word or sound-a-like substitutions may have occurred due to the inherent limitations of voice recognition software  This visit occurred during the SARS-CoV-2 public health emergency.  Safety protocols were in place, including screening questions prior to the visit, additional usage of staff PPE, and extensive cleaning of exam room while observing appropriate contact time as indicated for disinfecting solutions.

## 2020-05-29 ENCOUNTER — Other Ambulatory Visit: Payer: Self-pay | Admitting: Family Medicine

## 2020-05-29 NOTE — Telephone Encounter (Signed)
Please review

## 2020-06-08 ENCOUNTER — Encounter: Payer: Self-pay | Admitting: Family Medicine

## 2020-06-08 ENCOUNTER — Other Ambulatory Visit: Payer: Self-pay

## 2020-06-08 ENCOUNTER — Other Ambulatory Visit (INDEPENDENT_AMBULATORY_CARE_PROVIDER_SITE_OTHER): Payer: Medicare Other

## 2020-06-08 ENCOUNTER — Telehealth (INDEPENDENT_AMBULATORY_CARE_PROVIDER_SITE_OTHER): Payer: Medicare Other | Admitting: Family Medicine

## 2020-06-08 VITALS — Temp 100.1°F

## 2020-06-08 DIAGNOSIS — R52 Pain, unspecified: Secondary | ICD-10-CM

## 2020-06-08 DIAGNOSIS — R3129 Other microscopic hematuria: Secondary | ICD-10-CM

## 2020-06-08 DIAGNOSIS — R350 Frequency of micturition: Secondary | ICD-10-CM | POA: Diagnosis not present

## 2020-06-08 DIAGNOSIS — W57XXXA Bitten or stung by nonvenomous insect and other nonvenomous arthropods, initial encounter: Secondary | ICD-10-CM

## 2020-06-08 DIAGNOSIS — R6883 Chills (without fever): Secondary | ICD-10-CM

## 2020-06-08 LAB — POCT URINALYSIS DIPSTICK
Bilirubin, UA: NEGATIVE
Blood, UA: POSITIVE
Glucose, UA: NEGATIVE
Ketones, UA: NEGATIVE
Leukocytes, UA: NEGATIVE
Nitrite, UA: NEGATIVE
Protein, UA: NEGATIVE
Spec Grav, UA: 1.02 (ref 1.010–1.025)
Urobilinogen, UA: 0.2 E.U./dL
pH, UA: 6.5 (ref 5.0–8.0)

## 2020-06-08 MED ORDER — DOXYCYCLINE HYCLATE 100 MG PO TABS: 100.0000 mg | ORAL_TABLET | Freq: Two times a day (BID) | ORAL | 0 refills | Status: DC

## 2020-06-08 NOTE — Progress Notes (Signed)
Virtual Visit via Video Note  I connected with Julie-Ann  on 06/08/20 at  4:20 PM EDT by a video enabled telemedicine application and verified that I am speaking with the correct person using two identifiers.  Location patient: home, [place] Location provider:work or home office Persons participating in the virtual visit: patient, provider  I discussed the limitations of evaluation and management by telemedicine and the availability of in person appointments. The patient expressed understanding and agreed to proceed.   HPI:  Acute visit for Dysuria: -started last night -symptoms included:  low grade temp 100.1 today, chills, body aches -she removed two small ticks that were deeply imbedded and had been attached for >72 hours last week -she is concerned about tick born illness -she also thought she had a little urinary urgency overnight, so got urine studies at PCP office today -denies abd pain, flank pain, discomfort with urination, hematuria, nasal congestion,  vomiting, diarrhea, nasal congestion, cough, sore throat, rash other than at site of tick bites -she has a history of UTI in the past -no known sick contacts - but 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  Past Medical History:  Diagnosis Date  . Cataract   . DDD (degenerative disc disease), cervical 2006  . Depression 1/06   resolved  . Diverticulitis   . Diverticulosis of colon 12/28/2019   Multiple large mouth colonoscopy finding  . Dysphonia   . Frequent PVCs   . Hyperlipidemia   . Hypothyroidism   . Nocturnal leg cramps 06/06/2014  . Non-seasonal allergic rhinitis   . Osteoporosis 2017  . SUI (stress urinary incontinence, female) 12/07  . Vitamin D deficiency disease 11/07    Past Surgical History:  Procedure Laterality Date  . BREAST  EXCISIONAL BIOPSY Right 1991   benign  . COMBINED HYSTERECTOMY VAGINAL W/ MMK / A&P REPAIR  1983  . HAND SURGERY Left 10/2009   S-L   . LEFT HEART CATH AND CORONARY ANGIOGRAPHY N/A 08/31/2019   Procedure: LEFT HEART CATH AND CORONARY ANGIOGRAPHY;  Surgeon: Martinique, Peter M, MD;  Location: [facility];  Service: Cardiovascular;  Laterality: N/A;  . LIPOSUCTION    . TONSILLECTOMY  as child  . WISDOM TOOTH EXTRACTION      Family History  Problem Relation Age of Onset  . Cancer Mother        cervical/breast  . Osteoarthritis Mother   . Dementia Mother   . Breast cancer Mother 4  . Endometriosis Sister   . Heart disease Brother   . Stroke Brother   . Stroke Maternal Grandmother     SOCIAL HX:see hpi   Current Outpatient Medications:  .  alendronate (FOSAMAX) 70 MG tablet, TAKE 1 TABLET BY MOUTH EVERY 7 DAYS. TAKE WITH A FULL GLASS OF WATER ON AN EMPTY STOMACH., Disp: 12 tablet, Rfl: 0 .  Cholecalciferol (VITAMIN D-3) 5000 UNITS TABS, Take 4,000 Units by mouth daily. , Disp: , Rfl:  .  CVS MAGNESIUM CITRATE PO, Take 250 mg by mouth daily. , Disp: , Rfl:  .  EUTHYROX 75 MCG tablet, Take 1 tablet by mouth once daily, Disp: 30 tablet, Rfl: 2 .  famotidine (PEPCID) 20 MG tablet, Take 20 mg by mouth at bedtime. , Disp: , Rfl:  .  fish oil-omega-3 fatty acids 1000 MG  capsule, Take 1 g by mouth daily.  , Disp: , Rfl:  .  fluticasone (FLONASE) 50 MCG/ACT nasal spray, Place 1 spray into both nostrils daily as needed for allergies. , Disp: , Rfl:  .  metoprolol succinate (TOPROL XL) 25 MG 24 hr tablet, Take 1 tablet (25 mg total) by mouth daily., Disp: 90 tablet, Rfl: 2 .  metroNIDAZOLE (METROCREAM) 0.75 % cream, Apply 1 application topically daily as needed (rosacea). , Disp: , Rfl:  .  mometasone (ELOCON) 0.1 % cream, Apply 1 application topically daily as needed (itchy ears). , Disp: , Rfl:  .  Multiple Vitamin (MULTIVITAMIN) tablet, Take 1 tablet by mouth daily.  , Disp: , Rfl:  .   olopatadine (PATANOL) 0.1 % ophthalmic solution, Place 1 drop into both eyes 2 (two) times daily as needed for allergies. , Disp: , Rfl:  .  Red Yeast Rice 600 MG CAPS, Take 1,200 mg by mouth daily., Disp: , Rfl:  .  doxycycline (VIBRA-TABS) 100 MG tablet, Take 1 tablet (100 mg total) by mouth 2 (two) times daily., Disp: 20 tablet, Rfl: 0  EXAM:  VITALS per patient if applicable:  GENERAL: alert, oriented, appears well and in no acute distress  HEENT: atraumatic, conjunttiva clear, no obvious abnormalities on inspection of external nose and ears  NECK: normal movements of the head and neck  LUNGS: on inspection no signs of respiratory distress, breathing rate appears normal, no obvious gross SOB, gasping or wheezing  CV: no obvious cyanosis  MS: moves all visible extremities without noticeable abnormality  PSYCH/NEURO: pleasant and cooperative, no obvious depression or anxiety, speech and thought processing grossly intact  ASSESSMENT AND PLAN:  Discussed the following assessment and plan:  Body aches  Chills  Tick bite, initial encounter  Other microscopic hematuria  -we discussed possible serious and likely etiologies, options for evaluation and workup, limitations of telemedicine visit vs in person visit, treatment, treatment risks and precautions. Pt prefers to treat via telemedicine empirically rather then risking or undertaking an in person visit at this moment. She is concerned abut tick born illness and given symptoms and had several ticks attached for > 72 hours, I agree that treatment with coverage for RMSF, lyme and Ehrlichiosis is reasonable. Rx for doxy 100mg  bid x10 days sent. She had a UA today at her PCP office which shows blood, but is negative o/w.  Culture is pending.We also discussed the possibility of a viral infection or less likely given vaccinated, COVID19, given has been around a lot of children.  Advised to seek prompt in person care if worsening, new  symptoms arise, or if is not improving with treatment over the next 1-2 days. Also, advised follow up with PCP next week and may need repeat urine studies and further evaluation if culture is negative for the microscopic hematuria. Sent message to PCP office to schedule follow up.    I discussed the assessment and treatment plan with the patient. The patient was provided an opportunity to ask questions and all were answered. The patient agreed with the plan and demonstrated an understanding of the instructions.   The patient was advised to call back or seek an in-person evaluation if the symptoms worsen or if the condition fails to improve as anticipated.   Lucretia Kern, DO

## 2020-06-08 NOTE — Patient Instructions (Signed)
-  I sent the medication(s) we discussed to your pharmacy: Meds ordered this encounter  Medications  . doxycycline (VIBRA-TABS) 100 MG tablet    Sig: Take 1 tablet (100 mg total) by mouth 2 (two) times daily.    Dispense:  20 tablet    Refill:  0    Please let us know if you have any questions or concerns regarding this prescription.  The culture results should come back by next Monday or Tuesday. I will not be back on shift until next Tuesday...but you can contact your primary care office for your urine test results if they have not contacted you by Monday.  Follow up next week with your primary care office for a recheck as there was some blood in your urine.  I hope you are feeling better soon! Seek care promptly if your symptoms worsen, new concerns arise or you are not improving with treatment over the next 24 hours.

## 2020-06-09 LAB — URINE CULTURE
MICRO NUMBER:: 10737482
Result:: NO GROWTH
SPECIMEN QUALITY:: ADEQUATE

## 2020-06-09 NOTE — Progress Notes (Signed)
Scheduled pt on 7/28 with Inda Coke due to PCP being out of office for the week.

## 2020-06-14 ENCOUNTER — Other Ambulatory Visit: Payer: Self-pay

## 2020-06-14 ENCOUNTER — Ambulatory Visit (INDEPENDENT_AMBULATORY_CARE_PROVIDER_SITE_OTHER): Payer: Medicare Other | Admitting: Physician Assistant

## 2020-06-14 ENCOUNTER — Encounter: Payer: Self-pay | Admitting: Physician Assistant

## 2020-06-14 VITALS — BP 90/60 | HR 76 | Temp 98.1°F | Ht 66.0 in | Wt 131.0 lb

## 2020-06-14 DIAGNOSIS — W57XXXD Bitten or stung by nonvenomous insect and other nonvenomous arthropods, subsequent encounter: Secondary | ICD-10-CM

## 2020-06-14 DIAGNOSIS — R319 Hematuria, unspecified: Secondary | ICD-10-CM

## 2020-06-14 DIAGNOSIS — S90862A Insect bite (nonvenomous), left foot, initial encounter: Secondary | ICD-10-CM | POA: Diagnosis not present

## 2020-06-14 DIAGNOSIS — W57XXXA Bitten or stung by nonvenomous insect and other nonvenomous arthropods, initial encounter: Secondary | ICD-10-CM

## 2020-06-14 LAB — URINALYSIS, ROUTINE W REFLEX MICROSCOPIC
Bilirubin Urine: NEGATIVE
Glucose, UA: NEGATIVE
Hgb urine dipstick: NEGATIVE
Ketones, ur: NEGATIVE
Leukocytes,Ua: NEGATIVE
Nitrite: NEGATIVE
Protein, ur: NEGATIVE
Specific Gravity, Urine: 1.008 (ref 1.001–1.03)
pH: 6 (ref 5.0–8.0)

## 2020-06-14 NOTE — Patient Instructions (Signed)
It was great to see you!  I will be in touch with your urine results.  If you take a turn for the worse or have other concerns, lets do lab work.  I think you are on the right path!  Take care,  Inda Coke PA-C

## 2020-06-14 NOTE — Progress Notes (Signed)
Sharon Harrell is a 69 y.o. female is here for follow up.  I acted as a Education administrator for [employer], PA-C Anselmo Pickler, LPN   History of Present Illness:   Chief Complaint  Patient presents with  . tick bites  . Hematuria    HPI   Tick bites Pt following up on tick bites.  She saw Dr. Rodena Goldmann virtually on 06/08/2020 for this.  She was started on oral doxycycline.  She reported that she had to tiny deer ticks on her  left outer buttock and this is healing.  She does report that yesterday she noticed a new bite on left inner foot it is red and has a knot, she reports that this was not there for more than 2 days.. Tick removed and site cleaned with alcohol. Pt is currently on Doxycycline 100 mg BID x 10 days. Denies fever or chills. Headache has resolved and less body aches.  Overall she feels better today than she has in the past few days.  Denies any prior history of Lyme disease or other tickborne illness.  Hematuria Point-of-care urine that was done in the office for the above virtual visit showed positive blood.  She would like to follow-up on this today.  She was a former smoker but only for a few years in her teens.  She does not have any pain with urination but is having urinary frequency.    There are no preventive care reminders to display for this patient.  Past Medical History:  Diagnosis Date  . Cataract   . DDD (degenerative disc disease), cervical 2006  . Depression 1/06   resolved  . Diverticulitis   . Diverticulosis of colon 12/28/2019   Multiple large mouth colonoscopy finding  . Dysphonia   . Frequent PVCs   . Hyperlipidemia   . Hypothyroidism   . Nocturnal leg cramps 06/06/2014  . Non-seasonal allergic rhinitis   . Osteoporosis 2017  . SUI (stress urinary incontinence, female) 12/07  . Vitamin D deficiency disease 11/07     Social History   Tobacco Use  . Smoking status: Former Smoker    Packs/day: 0.25    Years: 8.00    Pack years: 2.00    Quit  date: 11/19/1971    Years since quitting: 48.6  . Smokeless tobacco: Never Used  Vaping Use  . Vaping Use: Never used  Substance Use Topics  . Alcohol use: Yes    Comment: socially  . Drug use: No    Past Surgical History:  Procedure Laterality Date  . BREAST EXCISIONAL BIOPSY Right 1991   benign  . COMBINED HYSTERECTOMY VAGINAL W/ MMK / A&P REPAIR  1983  . HAND SURGERY Left 10/2009   S-L   . LEFT HEART CATH AND CORONARY ANGIOGRAPHY N/A 08/31/2019   Procedure: LEFT HEART CATH AND CORONARY ANGIOGRAPHY;  Surgeon: Martinique, Peter M, MD;  Location: [facility];  Service: Cardiovascular;  Laterality: N/A;  . LIPOSUCTION    . TONSILLECTOMY  as child  . WISDOM TOOTH EXTRACTION      Family History  Problem Relation Age of Onset  . Cancer Mother        cervical/breast  . Osteoarthritis Mother   . Dementia Mother   . Breast cancer Mother 5  . Endometriosis Sister   . Heart disease Brother   . Stroke Brother   . Stroke Maternal Grandmother     PMHx, SurgHx, SocialHx, FamHx, Medications, and Allergies were reviewed  in the Visit Navigator and updated as appropriate.   Patient Active Problem List   Diagnosis Date Noted  . Primary osteoarthritis of right hand 04/20/2020  . Acquired hypothyroidism 12/28/2019  . Allergic conjunctivitis 12/28/2019  . Diverticulosis of colon 12/28/2019  . Rosacea 12/28/2019  . Mixed hyperlipidemia 08/31/2019  . Esophageal dysphagia 07/01/2019  . Gastroesophageal reflux disease 07/01/2019  . Chronic idiopathic constipation 07/01/2019  . Vitreous degeneration of both eyes 02/18/2019  . Frequent PVCs 10/07/2018  . Chronic allergic rhinitis 02/21/2018  . Family history of breast cancer 02/21/2018  . Hepatic cyst 02/21/2018  . Laryngopharyngeal reflux (LPR) 07/16/2017  . Osteoporosis 07/16/2017  . Nocturnal leg cramps 06/06/2014    Social History   Tobacco Use  . Smoking status: Former Smoker    Packs/day: 0.25    Years: 8.00    Pack  years: 2.00    Quit date: 11/19/1971    Years since quitting: 48.6  . Smokeless tobacco: Never Used  Vaping Use  . Vaping Use: Never used  Substance Use Topics  . Alcohol use: Yes    Comment: socially  . Drug use: No    Current Medications and Allergies:    Current Outpatient Medications:  .  alendronate (FOSAMAX) 70 MG tablet, TAKE 1 TABLET BY MOUTH EVERY 7 DAYS. TAKE WITH A FULL GLASS OF WATER ON AN EMPTY STOMACH., Disp: 12 tablet, Rfl: 0 .  Cholecalciferol (VITAMIN D-3) 5000 UNITS TABS, Take 5,000 Units by mouth daily. , Disp: , Rfl:  .  CRANBERRY PO, Take 1 capsule by mouth daily., Disp: , Rfl:  .  CVS MAGNESIUM CITRATE PO, Take 250 mg by mouth daily. , Disp: , Rfl:  .  doxycycline (VIBRA-TABS) 100 MG tablet, Take 1 tablet (100 mg total) by mouth 2 (two) times daily., Disp: 20 tablet, Rfl: 0 .  ELDERBERRY PO, Take 1 tablet by mouth daily., Disp: , Rfl:  .  EUTHYROX 75 MCG tablet, Take 1 tablet by mouth once daily, Disp: 30 tablet, Rfl: 2 .  famotidine (PEPCID) 20 MG tablet, Take 20 mg by mouth at bedtime. , Disp: , Rfl:  .  fluticasone (FLONASE) 50 MCG/ACT nasal spray, Place 1 spray into both nostrils daily as needed for allergies. , Disp: , Rfl:  .  metoprolol succinate (TOPROL XL) 25 MG 24 hr tablet, Take 1 tablet (25 mg total) by mouth daily., Disp: 90 tablet, Rfl: 2 .  metroNIDAZOLE (METROCREAM) 0.75 % cream, Apply 1 application topically daily as needed (rosacea). , Disp: , Rfl:  .  mometasone (ELOCON) 0.1 % cream, Apply 1 application topically daily as needed (itchy ears). , Disp: , Rfl:  .  Multiple Vitamin (MULTIVITAMIN) tablet, Take 1 tablet by mouth daily.  , Disp: , Rfl:  .  olopatadine (PATANOL) 0.1 % ophthalmic solution, Place 1 drop into both eyes 2 (two) times daily as needed for allergies. , Disp: , Rfl:  .  Red Yeast Rice 600 MG CAPS, Take 1,200 mg by mouth daily., Disp: , Rfl:  .  ZINC CITRATE PO, Take 2 each by mouth daily., Disp: , Rfl:   No Known  Allergies  Review of Systems   ROS  Negative unless otherwise specified per HPI.  Vitals:   Vitals:   06/14/20 1134  BP: (!) 90/60  Pulse: 76  Temp: 98.1 F (36.7 C)  TempSrc: Temporal  SpO2: 98%  Weight: 131 lb (59.4 kg)  Height: 5\' 6"  (1.676 m)     Body mass index  is 21.14 kg/m.   Physical Exam:    Physical Exam Vitals and nursing note reviewed.  Constitutional:      General: She is not in acute distress.    Appearance: She is well-developed. She is not ill-appearing or toxic-appearing.  Cardiovascular:     Rate and Rhythm: Normal rate and regular rhythm.     Pulses: Normal pulses.     Heart sounds: Normal heart sounds, S1 normal and S2 normal.     Comments: No LE edema Pulmonary:     Effort: Pulmonary effort is normal.     Breath sounds: Normal breath sounds.  Skin:    General: Skin is warm and dry.     Comments: Erythematous papule to L inner heel without TTP  Left hip with 2 well-healing papules  Neurological:     Mental Status: She is alert.     GCS: GCS eye subscore is 4. GCS verbal subscore is 5. GCS motor subscore is 6.  Psychiatric:        Speech: Speech normal.        Behavior: Behavior normal. Behavior is cooperative.      Assessment and Plan:    Robertine was seen today for tick bites and hematuria.  Diagnoses and all orders for this visit:  Hematuria, unspecified type We will send her urine for urinalysis to check for RBCs. If positive, will send to urology for further evaluation and management. -     Urinalysis, Routine w reflex microscopic; Future -     Urinalysis, Routine w reflex microscopic  Insect bite of left foot, initial encounter New tick bite that appears to be healing well.  Continue oral doxycycline that has already been prescribed for prior tick bites.  Worsening precautions advised.  Tick bite, subsequent encounter Area appears to be healing well.  Continue oral doxycycline.  She is already on treatment for presumed Lyme  disease, discussed that since she is continuing to improve I do not feel strongly about checking any lab work at this time.  She is agreeable to plan and will follow up should her symptoms worsen or not completely resolved.   . Reviewed expectations re: course of current medical issues. . Discussed self-management of symptoms. . Outlined signs and symptoms indicating need for more acute intervention. . Patient verbalized understanding and all questions were answered. . See orders for this visit as documented in the electronic medical record. . Patient received an After Visit Summary.  CMA or LPN served as scribe during this visit. History, Physical, and Plan performed by medical provider. The above documentation has been reviewed and is accurate and complete.  Time spent with patient today was 25 minutes which consisted of chart review, discussing diagnosis, work up, treatment answering questions and documentation.   Inda Coke, PA-C [facility] [place] [facility] 06/14/2020  Follow-up: No follow-ups on file.

## 2020-07-10 ENCOUNTER — Encounter: Payer: Self-pay | Admitting: Family Medicine

## 2020-07-10 DIAGNOSIS — Z780 Asymptomatic menopausal state: Secondary | ICD-10-CM

## 2020-07-10 DIAGNOSIS — Z1231 Encounter for screening mammogram for malignant neoplasm of breast: Secondary | ICD-10-CM

## 2020-07-11 ENCOUNTER — Other Ambulatory Visit: Payer: Self-pay | Admitting: Family Medicine

## 2020-07-11 DIAGNOSIS — Z1231 Encounter for screening mammogram for malignant neoplasm of breast: Secondary | ICD-10-CM

## 2020-08-16 ENCOUNTER — Other Ambulatory Visit: Payer: Self-pay

## 2020-08-16 ENCOUNTER — Ambulatory Visit
Admission: RE | Admit: 2020-08-16 | Discharge: 2020-08-16 | Disposition: A | Discharge status: Home / Self Care | Payer: Medicare Other | Source: Ambulatory Visit | Attending: Family Medicine | Admitting: Family Medicine

## 2020-08-16 DIAGNOSIS — Z780 Asymptomatic menopausal state: Secondary | ICD-10-CM

## 2020-08-16 DIAGNOSIS — M85851 Other specified disorders of bone density and structure, right thigh: Secondary | ICD-10-CM | POA: Diagnosis not present

## 2020-08-24 ENCOUNTER — Other Ambulatory Visit: Payer: Self-pay

## 2020-08-24 ENCOUNTER — Encounter: Payer: Self-pay | Admitting: Family Medicine

## 2020-08-24 DIAGNOSIS — M810 Age-related osteoporosis without current pathological fracture: Secondary | ICD-10-CM

## 2020-08-24 MED ORDER — ALENDRONATE SODIUM 70 MG PO TABS: ORAL_TABLET | ORAL | 2 refills | Status: DC

## 2020-08-28 ENCOUNTER — Encounter: Payer: Self-pay | Admitting: Family Medicine

## 2020-08-28 ENCOUNTER — Other Ambulatory Visit: Payer: Medicare Other

## 2020-08-28 ENCOUNTER — Other Ambulatory Visit: Payer: Self-pay

## 2020-08-28 ENCOUNTER — Telehealth: Payer: Self-pay

## 2020-08-28 DIAGNOSIS — N289 Disorder of kidney and ureter, unspecified: Secondary | ICD-10-CM

## 2020-08-28 DIAGNOSIS — E039 Hypothyroidism, unspecified: Secondary | ICD-10-CM

## 2020-08-28 DIAGNOSIS — E785 Hyperlipidemia, unspecified: Secondary | ICD-10-CM | POA: Diagnosis not present

## 2020-08-28 DIAGNOSIS — E538 Deficiency of other specified B group vitamins: Secondary | ICD-10-CM | POA: Diagnosis not present

## 2020-08-28 DIAGNOSIS — Z0000 Encounter for general adult medical examination without abnormal findings: Secondary | ICD-10-CM

## 2020-08-28 DIAGNOSIS — E559 Vitamin D deficiency, unspecified: Secondary | ICD-10-CM | POA: Diagnosis not present

## 2020-08-28 NOTE — Telephone Encounter (Signed)
Per review of Epic, patient is scheduled for AEX 09/04/20 at 11:30am with Dr. Talbert Nan.  Call returned to patient. No additional appt needed at this time. Patient will keep AEX as scheduled.  Questions answered.   Encounter closed.

## 2020-08-28 NOTE — Addendum Note (Signed)
Addended by: Doran Clay A on: 08/28/2020 01:29 PM   Modules accepted: Orders

## 2020-08-28 NOTE — Telephone Encounter (Signed)
Appointment Request From: Jethro Poling    With Provider: Salvadore Dom, MD [facility]]    Preferred Date Range: 10/03/2020 - 10/06/2020    Preferred Times: Any Time    Reason for visit: Office Visit    Comments:  Pelvic exam and discuss bone density scheduled 11/12

## 2020-08-29 LAB — PTH, INTACT AND CALCIUM
Calcium: 9.5 mg/dL (ref 8.6–10.4)
PTH: 27 pg/mL (ref 14–64)

## 2020-08-30 ENCOUNTER — Encounter: Payer: Self-pay | Admitting: Family Medicine

## 2020-08-30 ENCOUNTER — Ambulatory Visit (INDEPENDENT_AMBULATORY_CARE_PROVIDER_SITE_OTHER): Payer: Medicare Other | Admitting: Family Medicine

## 2020-08-30 ENCOUNTER — Other Ambulatory Visit: Payer: Self-pay

## 2020-08-30 VITALS — BP 90/58 | HR 68 | Temp 98.1°F | Resp 16 | Ht 66.0 in | Wt 129.6 lb

## 2020-08-30 DIAGNOSIS — L239 Allergic contact dermatitis, unspecified cause: Secondary | ICD-10-CM

## 2020-08-30 DIAGNOSIS — K219 Gastro-esophageal reflux disease without esophagitis: Secondary | ICD-10-CM

## 2020-08-30 DIAGNOSIS — E782 Mixed hyperlipidemia: Secondary | ICD-10-CM

## 2020-08-30 DIAGNOSIS — I493 Ventricular premature depolarization: Secondary | ICD-10-CM

## 2020-08-30 DIAGNOSIS — J309 Allergic rhinitis, unspecified: Secondary | ICD-10-CM

## 2020-08-30 DIAGNOSIS — M810 Age-related osteoporosis without current pathological fracture: Secondary | ICD-10-CM

## 2020-08-30 DIAGNOSIS — K5904 Chronic idiopathic constipation: Secondary | ICD-10-CM | POA: Diagnosis not present

## 2020-08-30 DIAGNOSIS — E039 Hypothyroidism, unspecified: Secondary | ICD-10-CM | POA: Diagnosis not present

## 2020-08-30 NOTE — Patient Instructions (Signed)
Please return in 12 months for your annual complete physical; please come fasting.  Everything looks good! Restart your claritin daily and consider taking 1/2 of your metoprolol dose daily if your blood pressures remain so low.    If you have any questions or concerns, please don't hesitate to send me a message via MyChart or call the office at [phone]. Thank you for visiting with [place] today! It's our pleasure caring for you.

## 2020-08-30 NOTE — Progress Notes (Signed)
Subjective  Chief Complaint  Patient presents with  . Annual Exam  . Alopecia    started over a year ago    HPI: Sharon Harrell is a 69 y.o. female who presents to [facility] [place] today for a Female Wellness Visit. She also has the concerns and/or needs as listed above in the chief complaint. These will be addressed in addition to the Health Maintenance Visit.   Wellness Visit: annual visit with health maintenance review and exam without Pap   HM: had fasting labs drawn. Feels well. Healthy lifestyle. Declines flu and booster covid vaccines Chronic disease f/u and/or acute problem visit: (deemed necessary to be done in addition to the wellness visit):  Chronic problems are controlled. No sxs of low or high thyroid. Taking red yeast rice and due for cholesterol recheck. gerd is controlled.   Stable osteoporosis on year 4 of fosamax.   GI problems are stable.    Assessment  1. Acquired hypothyroidism   2. Mixed hyperlipidemia   3. Gastroesophageal reflux disease without esophagitis   4. Osteoporosis, unspecified osteoporosis type, unspecified pathological fracture presence   5. Chronic idiopathic constipation   6. Laryngopharyngeal reflux (LPR)      Plan  Female Wellness Visit:  Age appropriate Health Maintenance and Prevention measures were discussed with patient. Included topics are cancer screening recommendations, ways to keep healthy (see AVS) including dietary and exercise recommendations, regular eye and dental care, use of seat belts, and avoidance of moderate alcohol use and tobacco use.  Mammogram is scheduled  BMI: discussed patient's BMI and encouraged positive lifestyle modifications to help get to or maintain a target BMI.  HM needs and immunizations were addressed and ordered. See below for orders. See HM and immunization section for updates.  Routine labs and screening tests ordered including cmp, cbc and lipids where  appropriate.  Discussed recommendations regarding Vit D and calcium supplementation (see AVS)  Chronic disease management visit and/or acute problem visit:  Hypothyroidism is well controlled.  Continue current medications  We will recheck hyperlipidemia on red yeast rice and recalculate ascvd risk score.   GERD and constipation are controlled.   Osteoporosis on fosamax is stable.    Follow up: 12 mo for cpe  No orders of the defined types were placed in this encounter.  No orders of the defined types were placed in this encounter.     Lifestyle: Body mass index is 20.92 kg/m. Wt Readings from Last 3 Encounters:  08/30/20 129 lb 9.6 oz (58.8 kg)  06/14/20 131 lb (59.4 kg)  04/20/20 131 lb 12.8 oz (59.8 kg)    Patient Active Problem List   Diagnosis Date Noted  . Acquired hypothyroidism 12/28/2019    Priority: High  . Mixed hyperlipidemia 08/31/2019    Priority: High  . Frequent PVCs 10/07/2018    Priority: High    Normal cardiac cath and nl EF 08/2019, Dr. Martinique   . Osteoporosis 07/16/2017    Priority: High    Dr. Talbert Nan: 2017: T = -2.5, started meds: f/u 2019 improved T = - 2.1 on fosamax. F/u dexa 2021: T - -2.2, stable on fosamax   . Esophageal dysphagia 07/01/2019    Priority: Medium    Followed by  Dr. Collene Mares last seen 06/08/19 EGD ordered.  EGD preformed on [id number] impression: normal appearing widely patent esophagus and GEJ-Z line was measured at 40 cm. Mild antral gastritis. Normal examined duodenum. No specimens collected.    Marland Kitchen  Gastroesophageal reflux disease 07/01/2019    Priority: Medium    Followed by  Dr. Collene Mares last seen 06/08/19   . Chronic idiopathic constipation 07/01/2019    Priority: Medium    Followed by  Dr. Collene Mares last seen 06/08/19   . Family history of breast cancer 02/21/2018    Priority: Medium    Lifetime risk is greater than 20%. She has history of a benign surgical excisional biopsy of the right breast in 1991.   . Laryngopharyngeal  reflux (LPR) 07/16/2017    Priority: Medium  . Primary osteoarthritis of right hand 04/20/2020    Priority: Low  . Allergic conjunctivitis 12/28/2019    Priority: Low  . Diverticulosis of colon 12/28/2019    Priority: Low    Multiple large mouth colonoscopy finding   . Rosacea 12/28/2019    Priority: Low  . Chronic allergic rhinitis 02/21/2018    Priority: Low  . Hepatic cyst 02/21/2018    Priority: Low  . Nocturnal leg cramps 06/06/2014    Priority: Low  . Vitreous degeneration of both eyes 02/18/2019   Health Maintenance  Topic Date Due  . INFLUENZA VACCINE  02/15/2021 (Originally 06/18/2020)  . MAMMOGRAM  09/27/2020  . DEXA SCAN  08/16/2022  . TETANUS/TDAP  02/24/2029  . COLONOSCOPY  07/13/2029  . COVID-19 Vaccine  Completed  . Hepatitis C Screening  Completed  . PNA vac Low Risk Adult  Completed   Immunization History  Administered Date(s) Administered  . Influenza, High Dose Seasonal PF 08/22/2017, 08/31/2018, 09/08/2019  . Influenza-Unspecified 08/22/2017  . PFIZER SARS-COV-2 Vaccination 01/29/2020, 02/23/2020  . Pneumococcal Conjugate-13 11/02/2018  . Pneumococcal Polysaccharide-23 12/28/2019  . Td 02/25/2019  . Zoster 04/18/2012   We updated and reviewed the patient's past history in detail and it is documented below. Allergies: Patient has No Known Allergies. Past Medical History Patient  has a past medical history of Cataract, DDD (degenerative disc disease), cervical (2006), Depression (1/06), Diverticulitis, Diverticulosis of colon (12/28/2019), Dysphonia, Frequent PVCs, Hyperlipidemia, Hypothyroidism, Nocturnal leg cramps (06/06/2014), Non-seasonal allergic rhinitis, Osteoporosis (2017), SUI (stress urinary incontinence, female) (12/07), and Vitamin D deficiency disease (11/07). Past Surgical History Patient  has a past surgical history that includes Hand surgery (Left, 10/2009); Combined hysterectomy vaginal w/ MMK / A&P repair (6644); Tonsillectomy (as child);  Wisdom tooth extraction; Liposuction; LEFT HEART CATH AND CORONARY ANGIOGRAPHY (N/A, 08/31/2019); and Breast excisional biopsy (Right, 1991). Family History: Patient family history includes Breast cancer (age of onset: 31) in her mother; Cancer in her mother; Dementia in her mother; Endometriosis in her sister; Heart disease in her brother; Osteoarthritis in her mother; Stroke in her brother and maternal grandmother. Social History:  Patient  reports that she quit smoking about 48 years ago. She has a 2.00 pack-year smoking history. She has never used smokeless tobacco. She reports current alcohol use. She reports that she does not use drugs.  Review of Systems: Constitutional: negative for fever or malaise Ophthalmic: negative for photophobia, double vision or loss of vision Cardiovascular: negative for chest pain, dyspnea on exertion, or new LE swelling Respiratory: negative for SOB or persistent cough Gastrointestinal: negative for abdominal pain, change in bowel habits or melena Genitourinary: negative for dysuria or gross hematuria, no abnormal uterine bleeding or disharge Musculoskeletal: negative for new gait disturbance or muscular weakness Integumentary: negative for new or persistent rashes, no breast lumps Neurological: negative for TIA or stroke symptoms Psychiatric: negative for SI or delusions Allergic/Immunologic: negative for hives  Patient Care Team  Relationship Specialty Notifications Start End  Leamon Arnt, MD PCP - General Family Medicine  12/28/19   Bo Merino, MD Consulting Physician Rheumatology  06/06/14   [facility], [place] Physician Otolaryngology  07/16/17   Merrilee Seashore, MD Consulting Physician Internal Medicine  08/19/17   Rolla Etienne (Inactive) Physician Assistant Hematology and Oncology  09/25/17   Juanita Craver, MD Consulting Physician Gastroenterology  07/01/19   Evans Lance, MD Consulting  Physician Cardiology  12/28/19   Dorothy Spark, MD Consulting Physician Cardiology  12/28/19   Salvadore Dom, MD Consulting Physician Obstetrics and Gynecology  12/28/19   Sydnee Levans, MD Consulting Physician Dermatology  12/28/19   Kathrynn Ducking, MD Consulting Physician Neurology  12/28/19     Objective  Vitals: BP (!) 90/58   Pulse 68   Temp 98.1 F (36.7 C) (Temporal)   Resp 16   Ht 5\' 6"  (1.676 m)   Wt 129 lb 9.6 oz (58.8 kg)   LMP 08/18/1982 (Approximate)   SpO2 98%   BMI 20.92 kg/m  General:  Well developed, well nourished, no acute distress  Psych:  Alert and orientedx3,normal mood and affect HEENT:  Normocephalic, atraumatic, non-icteric sclera,  supple neck without adenopathy, mass or thyromegaly Cardiovascular:  Normal S1, S2, RRR without gallop, rub or murmur Respiratory:  Good breath sounds bilaterally, CTAB with normal respiratory effort Gastrointestinal: normal bowel sounds, soft, non-tender, no noted masses. No HSM MSK: no deformities, contusions. Joints are without erythema or swelling.  Skin:  Warm, no rashes or suspicious lesions noted, clear scalp Neurologic:    Mental status is normal. CN 2-11 are normal. Gross motor and sensory exams are normal. Normal gait. No tremor    No visits with results within 1 Day(s) from this visit.  Latest known visit with results is:  Orders Only on 08/28/2020  Component Date Value Ref Range Status  . Magnesium 08/28/2020 2.1  1.5 - 2.5 mg/dL Final  . Vitamin B-12 08/28/2020 >2,000* 200 - 1,100 pg/mL Final  . Vit D, 25-Hydroxy 08/28/2020 60  30 - 100 ng/mL Final  . Glucose, Bld 08/28/2020 94  65 - 99 mg/dL Final  . BUN 08/28/2020 17  7 - 25 mg/dL Final  . Creat 08/28/2020 0.78  0.50 - 0.99 mg/dL Final  . BUN/Creatinine Ratio 98/92/1194 NOT APPLICABLE  6 - 22 (calc) Final  . Sodium 08/28/2020 139  135 - 146 mmol/L Final  . Potassium 08/28/2020 4.9  3.5 - 5.3 mmol/L Final  . Chloride 08/28/2020 103  98 - 110  mmol/L Final  . CO2 08/28/2020 30  20 - 32 mmol/L Final  . Calcium 08/28/2020 9.5  8.6 - 10.4 mg/dL Final  . Phosphorus 08/28/2020 3.4  2.1 - 4.3 mg/dL Final  . Albumin 08/28/2020 4.3  3.6 - 5.1 g/dL Final  . T4, Total 08/28/2020 9.2  5.1 - 11.9 mcg/dL Final  . T3, Total 08/28/2020 116  76 - 181 ng/dL Final  . PTH 08/28/2020 27  14 - 64 pg/mL Final  . Calcium 08/28/2020 9.5  8.6 - 10.4 mg/dL Final     Commons side effects, risks, benefits, and alternatives for medications and treatment plan prescribed today were discussed, and the patient expressed understanding of the given instructions. Patient is instructed to call or message via MyChart if he/she has any questions or concerns regarding our treatment plan. No barriers to understanding were identified. We discussed Red Flag symptoms and signs in  detail. Patient expressed understanding regarding what to do in case of urgent or emergency type symptoms.   Medication list was reconciled, printed and provided to the patient in AVS. Patient instructions and summary information was reviewed with the patient as documented in the AVS. This note was prepared with assistance of Dragon voice recognition software. Occasional wrong-word or sound-a-like substitutions may have occurred due to the inherent limitations of voice recognition software  This visit occurred during the SARS-CoV-2 public health emergency.  Safety protocols were in place, including screening questions prior to the visit, additional usage of staff PPE, and extensive cleaning of exam room while observing appropriate contact time as indicated for disinfecting solutions.

## 2020-09-02 LAB — LIPID PANEL (REFL)
Cholesterol: 274 mg/dL — ABNORMAL HIGH (ref <200)
HDL: 79 mg/dL (ref >=50)
LDL Cholesterol (Calc): 169 mg/dL (calc) — ABNORMAL HIGH
Non-HDL Cholesterol (Calc): 195 mg/dL (calc) — ABNORMAL HIGH (ref <130)
Total CHOL/HDL Ratio: 3.5 (calc) (ref <5.0)
Triglycerides: 125 mg/dL (ref <150)

## 2020-09-02 LAB — RENAL FUNCTION PANEL
Albumin: 4.3 g/dL (ref 3.6–5.1)
BUN: 17 mg/dL (ref 7–25)
CO2: 30 mmol/L (ref 20–32)
Calcium: 9.5 mg/dL (ref 8.6–10.4)
Chloride: 103 mmol/L (ref 98–110)
Creat: 0.78 mg/dL (ref 0.50–0.99)
Glucose, Bld: 94 mg/dL (ref 65–99)
Phosphorus: 3.4 mg/dL (ref 2.1–4.3)
Potassium: 4.9 mmol/L (ref 3.5–5.3)
Sodium: 139 mmol/L (ref 135–146)

## 2020-09-02 LAB — T4: T4, Total: 9.2 ug/dL (ref 5.1–11.9)

## 2020-09-02 LAB — VITAMIN D 25 HYDROXY (VIT D DEFICIENCY, FRACTURES): Vit D, 25-Hydroxy: 60 ng/mL (ref 30–100)

## 2020-09-02 LAB — VITAMIN B12: Vitamin B-12: >2000 pg/mL — ABNORMAL HIGH (ref 200–1100)

## 2020-09-02 LAB — MAGNESIUM: Magnesium: 2.1 mg/dL (ref 1.5–2.5)

## 2020-09-02 LAB — T3: T3, Total: 116 ng/dL (ref 76–181)

## 2020-09-04 ENCOUNTER — Encounter: Payer: Self-pay | Admitting: Obstetrics and Gynecology

## 2020-09-04 ENCOUNTER — Ambulatory Visit (INDEPENDENT_AMBULATORY_CARE_PROVIDER_SITE_OTHER): Payer: Medicare Other | Admitting: Obstetrics and Gynecology

## 2020-09-04 ENCOUNTER — Other Ambulatory Visit: Payer: Self-pay

## 2020-09-04 VITALS — BP 110/62 | HR 66 | Ht 66.0 in | Wt 130.4 lb

## 2020-09-04 DIAGNOSIS — Z9189 Other specified personal risk factors, not elsewhere classified: Secondary | ICD-10-CM | POA: Diagnosis not present

## 2020-09-04 DIAGNOSIS — Z01419 Encounter for gynecological examination (general) (routine) without abnormal findings: Secondary | ICD-10-CM

## 2020-09-04 DIAGNOSIS — N3946 Mixed incontinence: Secondary | ICD-10-CM

## 2020-09-04 DIAGNOSIS — R109 Unspecified abdominal pain: Secondary | ICD-10-CM | POA: Diagnosis not present

## 2020-09-04 DIAGNOSIS — Z803 Family history of malignant neoplasm of breast: Secondary | ICD-10-CM

## 2020-09-04 NOTE — Patient Instructions (Addendum)
EXERCISE AND DIET:  We recommended that you start or continue a regular exercise program for good health. Regular exercise means any activity that makes your heart beat faster and makes you sweat.  We recommend exercising at least 30 minutes per day at least 3 days a week, preferably 4 or 5.  We also recommend a diet low in fat and sugar.  Inactivity, poor dietary choices and obesity can cause diabetes, heart attack, stroke, and kidney damage, among others.    ALCOHOL AND SMOKING:  Women should limit their alcohol intake to no more than 7 drinks/beers/glasses of wine (combined, not each!) per week. Moderation of alcohol intake to this level decreases your risk of breast cancer and liver damage. And of course, no recreational drugs are part of a healthy lifestyle.  And absolutely no smoking or even second hand smoke. Most people know smoking can cause heart and lung diseases, but did you know it also contributes to weakening of your bones? Aging of your skin?  Yellowing of your teeth and nails?  CALCIUM AND VITAMIN D:  Adequate intake of calcium and Vitamin D are recommended.  The recommendations for exact amounts of these supplements seem to change often, but generally speaking 1,200 mg of calcium (between diet and supplement) and 800 units of Vitamin D per day seems prudent. Certain women may benefit from higher intake of Vitamin D.  If you are among these women, your doctor will have told you during your visit.    PAP SMEARS:  Pap smears, to check for cervical cancer or precancers,  have traditionally been done yearly, although recent scientific advances have shown that most women can have pap smears less often.  However, every woman still should have a physical exam from her gynecologist every year. It will include a breast check, inspection of the vulva and vagina to check for abnormal growths or skin changes, a visual exam of the cervix, and then an exam to evaluate the size and shape of the uterus and  ovaries.  And after 68 years of age, a rectal exam is indicated to check for rectal cancers. We will also provide age appropriate advice regarding health maintenance, like when you should have certain vaccines, screening for sexually transmitted diseases, bone density testing, colonoscopy, mammograms, etc.   MAMMOGRAMS:  All women over 40 years old should have a yearly mammogram. Many facilities now offer a "3D" mammogram, which may cost around $50 extra out of pocket. If possible,  we recommend you accept the option to have the 3D mammogram performed.  It both reduces the number of women who will be called back for extra views which then turn out to be normal, and it is better than the routine mammogram at detecting truly abnormal areas.    COLON CANCER SCREENING: Now recommend starting at age 45. At this time colonoscopy is not covered for routine screening until 50. There are take home tests that can be done between 45-49.   COLONOSCOPY:  Colonoscopy to screen for colon cancer is recommended for all women at age 50.  We know, you hate the idea of the prep.  We agree, BUT, having colon cancer and not knowing it is worse!!  Colon cancer so often starts as a polyp that can be seen and removed at colonscopy, which can quite literally save your life!  And if your first colonoscopy is normal and you have no family history of colon cancer, most women don't have to have it again for   10 years.  Once every ten years, you can do something that may end up saving your life, right?  We will be happy to help you get it scheduled when you are ready.  Be sure to check your insurance coverage so you understand how much it will cost.  It may be covered as a preventative service at no cost, but you should check your particular policy.      Breast Self-Awareness Breast self-awareness means being familiar with how your breasts look and feel. It involves checking your breasts regularly and reporting any changes to your  health care provider. Practicing breast self-awareness is important. A change in your breasts can be a sign of a serious medical problem. Being familiar with how your breasts look and feel allows you to find any problems early, when treatment is more likely to be successful. All women should practice breast self-awareness, including women who have had breast implants. How to do a breast self-exam One way to learn what is normal for your breasts and whether your breasts are changing is to do a breast self-exam. To do a breast self-exam: Look for Changes  1. Remove all the clothing above your waist. 2. Stand in front of a mirror in a room with good lighting. 3. Put your hands on your hips. 4. Push your hands firmly downward. 5. Compare your breasts in the mirror. Look for differences between them (asymmetry), such as: ? Differences in shape. ? Differences in size. ? Puckers, dips, and bumps in one breast and not the other. 6. Look at each breast for changes in your skin, such as: ? Redness. ? Scaly areas. 7. Look for changes in your nipples, such as: ? Discharge. ? Bleeding. ? Dimpling. ? Redness. ? A change in position. Feel for Changes Carefully feel your breasts for lumps and changes. It is best to do this while lying on your back on the floor and again while sitting or standing in the shower or tub with soapy water on your skin. Feel each breast in the following way:  Place the arm on the side of the breast you are examining above your head.  Feel your breast with the other hand.  Start in the nipple area and make  inch (2 cm) overlapping circles to feel your breast. Use the pads of your three middle fingers to do this. Apply light pressure, then medium pressure, then firm pressure. The light pressure will allow you to feel the tissue closest to the skin. The medium pressure will allow you to feel the tissue that is a little deeper. The firm pressure will allow you to feel the tissue  close to the ribs.  Continue the overlapping circles, moving downward over the breast until you feel your ribs below your breast.  Move one finger-width toward the center of the body. Continue to use the  inch (2 cm) overlapping circles to feel your breast as you move slowly up toward your collarbone.  Continue the up and down exam using all three pressures until you reach your armpit.  Write Down What You Find  Write down what is normal for each breast and any changes that you find. Keep a written record with breast changes or normal findings for each breast. By writing this information down, you do not need to depend only on memory for size, tenderness, or location. Write down where you are in your menstrual cycle, if you are still menstruating. If you are having trouble noticing differences   in your breasts, do not get discouraged. With time you will become more familiar with the variations in your breasts and more comfortable with the exam. How often should I examine my breasts? Examine your breasts every month. If you are breastfeeding, the best time to examine your breasts is after a feeding or after using a breast pump. If you menstruate, the best time to examine your breasts is 5-7 days after your period is over. During your period, your breasts are lumpier, and it may be more difficult to notice changes. When should I see my health care provider? See your health care provider if you notice:  A change in shape or size of your breasts or nipples.  A change in the skin of your breast or nipples, such as a reddened or scaly area.  Unusual discharge from your nipples.  A lump or thick area that was not there before.  Pain in your breasts.  Anything that concerns you.  Kegel Exercises  Kegel exercises can help strengthen your pelvic floor muscles. The pelvic floor is a group of muscles that support your rectum, small intestine, and bladder. In females, pelvic floor muscles also help  support the womb (uterus). These muscles help you control the flow of urine and stool. Kegel exercises are painless and simple, and they do not require any equipment. Your provider may suggest Kegel exercises to:  Improve bladder and bowel control.  Improve sexual response.  Improve weak pelvic floor muscles after surgery to remove the uterus (hysterectomy) or pregnancy (females).  Improve weak pelvic floor muscles after prostate gland removal or surgery (males). Kegel exercises involve squeezing your pelvic floor muscles, which are the same muscles you squeeze when you try to stop the flow of urine or keep from passing gas. The exercises can be done while sitting, standing, or lying down, but it is best to vary your position. Exercises How to do Kegel exercises: 1. Squeeze your pelvic floor muscles tight. You should feel a tight lift in your rectal area. If you are a female, you should also feel a tightness in your vaginal area. Keep your stomach, buttocks, and legs relaxed. 2. Hold the muscles tight for up to 10 seconds. 3. Breathe normally. 4. Relax your muscles. 5. Repeat as told by your health care provider. Repeat this exercise daily as told by your health care provider. Continue to do this exercise for at least 4-6 weeks, or for as long as told by your health care provider. You may be referred to a physical therapist who can help you learn more about how to do Kegel exercises. Depending on your condition, your health care provider may recommend:  Varying how long you squeeze your muscles.  Doing several sets of exercises every day.  Doing exercises for several weeks.  Making Kegel exercises a part of your regular exercise routine. This information is not intended to replace advice given to you by your health care provider. Make sure you discuss any questions you have with your health care provider. Document Revised: 06/24/2018 Document Reviewed: 06/24/2018 Elsevier Patient  Education  [place].  Urinary Incontinence  Urinary incontinence refers to a condition in which a person is unable to control where and when to pass urine. A person with this condition will urinate when he or she does not mean to (involuntarily). What are the causes? This condition may be caused by:  Medicines.  Infections.  Constipation.  Overactive bladder muscles.  Weak bladder muscles.  Weak  pelvic floor muscles. These muscles provide support for the bladder, intestine, and, in women, the uterus.  Enlarged prostate in men. The prostate is a gland near the bladder. When it gets too big, it can pinch the urethra. With the urethra blocked, the bladder can weaken and lose the ability to empty properly.  Surgery.  Emotional factors, such as anxiety, stress, or post-traumatic stress disorder (PTSD).  Pelvic organ prolapse. This happens in women when organs shift out of place and into the vagina. This shift can prevent the bladder and urethra from working properly. What increases the risk? The following factors may make you more likely to develop this condition:  Older age.  Obesity and physical inactivity.  Pregnancy and childbirth.  Menopause.  Diseases that affect the nerves or spinal cord (neurological diseases).  Long-term (chronic) coughing. This can increase pressure on the bladder and pelvic floor muscles. What are the signs or symptoms? Symptoms may vary depending on the type of urinary incontinence you have. They include:  A sudden urge to urinate, but passing urine involuntarily before you can get to a bathroom (urge incontinence).  Suddenly passing urine with any activity that forces urine to pass, such as coughing, laughing, exercise, or sneezing (stress incontinence).  Needing to urinate often, but urinating only a small amount, or constantly dribbling urine (overflow incontinence).  Urinating because you cannot get to the bathroom in time due to  a physical disability, such as arthritis or injury, or communication and thinking problems, such as Alzheimer disease (functional incontinence). How is this diagnosed? This condition may be diagnosed based on:  Your medical history.  A physical exam.  Tests, such as: ? Urine tests. ? X-rays of your kidney and bladder. ? Ultrasound. ? CT scan. ? Cystoscopy. In this procedure, a health care provider inserts a tube with a light and camera (cystoscope) through the urethra and into the bladder in order to check for problems. ? Urodynamic testing. These tests assess how well the bladder, urethra, and sphincter can store and release urine. There are different types of urodynamic tests, and they vary depending on what the test is measuring. To help diagnose your condition, your health care provider may recommend that you keep a log of when you urinate and how much you urinate. How is this treated? Treatment for this condition depends on the type of incontinence that you have and its cause. Treatment may include:  Lifestyle changes, such as: ? Quitting smoking. ? Maintaining a healthy weight. ? Staying active. Try to get 150 minutes of moderate-intensity exercise every week. Ask your health care provider which activities are safe for you. ? Eating a healthy diet.  Avoid high-fat foods, like fried foods.  Avoid refined carbohydrates like white bread and white rice.  Limit how much alcohol and caffeine you drink.  Increase your fiber intake. Foods such as fresh fruits, vegetables, beans, and whole grains are healthy sources of fiber.  Pelvic floor muscle exercises.  Bladder training, such as lengthening the amount of time between bathroom breaks, or using the bathroom at regular intervals.  Using techniques to suppress bladder urges. This can include distraction techniques or controlled breathing exercises.  Medicines to relax the bladder muscles and prevent bladder spasms.  Medicines  to help slow or prevent the growth of a man's prostate.  Botox injections. These can help relax the bladder muscles.  Using pulses of electricity to help change bladder reflexes (electrical nerve stimulation).  For women, using a medical device to  prevent urine leaks. This is a small, tampon-like, disposable device that is inserted into the urethra.  Injecting collagen or carbon beads (bulking agents) into the urinary sphincter. These can help thicken tissue and close the bladder opening.  Surgery. Follow these instructions at home: Lifestyle  Limit alcohol and caffeine. These can fill your bladder quickly and irritate it.  Keep yourself clean to help prevent odors and skin damage. Ask your doctor about special skin creams and cleansers that can protect the skin from urine.  Consider wearing pads or adult diapers. Make sure to change them regularly, and always change them right after experiencing incontinence. General instructions  Take over-the-counter and prescription medicines only as told by your health care provider.  Use the bathroom about every 3-4 hours, even if you do not feel the need to urinate. Try to empty your bladder completely every time. After urinating, wait a minute. Then try to urinate again.  Make sure you are in a relaxed position while urinating.  If your incontinence is caused by nerve problems, keep a log of the medicines you take and the times you go to the bathroom.  Keep all follow-up visits as told by your health care provider. This is important. Contact a health care provider if:  You have pain that gets worse.  Your incontinence gets worse. Get help right away if:  You have a fever or chills.  You are unable to urinate.  You have redness in your groin area or down your legs. Summary  Urinary incontinence refers to a condition in which a person is unable to control where and when to pass urine.  This condition may be caused by medicines,  infection, weak bladder muscles, weak pelvic floor muscles, enlargement of the prostate (in men), or surgery.  The following factors increase your risk for developing this condition: older age, obesity, pregnancy and childbirth, menopause, neurological diseases, and chronic coughing.  There are several types of urinary incontinence. They include urge incontinence, stress incontinence, overflow incontinence, and functional incontinence.  This condition is usually treated first with lifestyle and behavioral changes, such as quitting smoking, eating a healthier diet, and doing regular pelvic floor exercises. Other treatment options include medicines, bulking agents, medical devices, electrical nerve stimulation, or surgery. This information is not intended to replace advice given to you by your health care provider. Make sure you discuss any questions you have with your health care provider. Document Revised: 11/14/2017 Document Reviewed: 02/13/2017 Elsevier Patient Education  [place].

## 2020-09-04 NOTE — Progress Notes (Signed)
69 y.o. G56P2002 Married White or Caucasian Not Hispanic or Latino female here for annual exam.  Lower abdominal discomfort. She feels like she has more "meat" around her belly. She has lost weight. Slight discomfort intermittently in the RLQ (mainly). This started a month ago. She has some constipation, helped with miralax. She takes the miralax intermittently. H/o hysterectomy, still has her ovaries.   She voids frequently, trying to drink more. She has some urgency to void, voids normal amounts. She has some leakage with valsalva or on the way to the bathroom. The leakage has been going on for the last year. She had a negative urine culture this summer. No dysuria.  She has been diagnosed with frequent PVC's and has cut back on caffeine.   Mother with bilateral breast cancer, her Oncologist recommended that she and her sister get tested for BRCA. She isn't sure if her mom was tested. She will reach out to the Oncologist.     [name] last menstrual period was 08/18/1982 (approximate).          Sexually active: No.  The current method of family planning is status post hysterectomy.    Exercising: Yes.    Walking  Smoker:  no  Health Maintenance: Pap:  Unsure  History of abnormal Pap:  no MMG:  09/29/19 density C Bi-rads 1 neg  BMD:   08/16/20 osteopenic, primary is managing.   Colonoscopy: 07/14/19 F/U 10 years  TDaP:  02/25/19  Gardasil: na   reports that she quit smoking about 48 years ago. She has a 2.00 pack-year smoking history. She has never used smokeless tobacco. She reports current alcohol use. She reports that she does not use drugs. 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 Taking care of her terminal sister in law. 2 kids, one local. 5 grand kids (youngest is 65, others are teenagers).   Past Medical History:  Diagnosis Date  . Cataract   . DDD (degenerative disc disease), cervical 2006  . Depression 1/06   resolved  . Diverticulitis   . Diverticulosis of colon  12/28/2019   Multiple large mouth colonoscopy finding  . Dysphonia   . Frequent PVCs   . Hyperlipidemia   . Hypothyroidism   . Nocturnal leg cramps 06/06/2014  . Non-seasonal allergic rhinitis   . Osteoporosis 2017  . SUI (stress urinary incontinence, female) 12/07  . Vitamin D deficiency disease 11/07    Past Surgical History:  Procedure Laterality Date  . BREAST EXCISIONAL BIOPSY Right 1991   benign  . COMBINED HYSTERECTOMY VAGINAL W/ MMK / A&P REPAIR  1983  . HAND SURGERY Left 10/2009   S-L   . LEFT HEART CATH AND CORONARY ANGIOGRAPHY N/A 08/31/2019   Procedure: LEFT HEART CATH AND CORONARY ANGIOGRAPHY;  Surgeon: Martinique, Peter M, MD;  Location: [facility];  Service: Cardiovascular;  Laterality: N/A;  . LIPOSUCTION    . TONSILLECTOMY  as child  . WISDOM TOOTH EXTRACTION      Current Outpatient Medications  Medication Sig Dispense Refill  . alendronate (FOSAMAX) 70 MG tablet TAKE 1 TABLET BY MOUTH EVERY 7 DAYS. TAKE WITH A FULL GLASS OF WATER ON AN EMPTY STOMACH. 12 tablet 2  . Cholecalciferol (VITAMIN D-3) 5000 UNITS TABS Take 5,000 Units by mouth daily.     Marland Kitchen CRANBERRY PO Take 1 capsule by mouth daily.    . [facility] MAGNESIUM CITRATE PO Take 250 mg by mouth daily.     . Cyanocobalamin (B-12) 5000 MCG  SUBL Place under the tongue.    Marland Kitchen ELDERBERRY PO Take 1 tablet by mouth daily.    Arna Medici 75 MCG tablet Take 1 tablet by mouth once daily 30 tablet 2  . famotidine (PEPCID) 20 MG tablet Take 20 mg by mouth at bedtime.     . fluticasone (FLONASE) 50 MCG/ACT nasal spray Place 1 spray into both nostrils daily as needed for allergies.     . metoprolol succinate (TOPROL XL) 25 MG 24 hr tablet Take 1 tablet (25 mg total) by mouth daily. 90 tablet 2  . metroNIDAZOLE (METROCREAM) 0.75 % cream Apply 1 application topically daily as needed (rosacea).     . mometasone (ELOCON) 0.1 % cream Apply 1 application topically daily as needed (itchy ears).     . Multiple Vitamin (MULTIVITAMIN)  tablet Take 1 tablet by mouth daily.      Marland Kitchen olopatadine (PATANOL) 0.1 % ophthalmic solution Place 1 drop into both eyes 2 (two) times daily as needed for allergies.     . Red Yeast Rice 600 MG CAPS Take 1,200 mg by mouth daily.    Marland Kitchen ZINC CITRATE PO Take 2 each by mouth daily.     No current facility-administered medications for this visit.    Family History  Problem Relation Age of Onset  . Cancer Mother        cervical/breast  . Osteoarthritis Mother   . Dementia Mother   . Breast cancer Mother 16  . Endometriosis Sister   . Heart disease Brother   . Stroke Brother   . Stroke Maternal Grandmother     Review of Systems  All other systems reviewed and are negative.   Exam:   BP 110/62   Pulse 66   Ht '5\' 6"'  (1.676 m)   Wt 130 lb 6.4 oz (59.1 kg)   LMP 08/18/1982 (Approximate)   SpO2 99%   BMI 21.05 kg/m   Weight change: '@WEIGHTCHANGE' @ Height:   Height: '5\' 6"'  (167.6 cm)  Ht Readings from Last 3 Encounters:  09/04/20 '5\' 6"'  (1.676 m)  08/30/20 '5\' 6"'  (1.676 m)  06/14/20 '5\' 6"'  (1.676 m)    General appearance: alert, cooperative and appears stated age Head: Normocephalic, without obvious abnormality, atraumatic Breasts: normal appearance, no masses or tenderness Abdomen: soft, non-tender; non distended,  no masses,  no organomegaly Extremities: extremities normal, atraumatic, no cyanosis or edema Skin: Skin color, texture, turgor normal. No rashes or lesions Lymph nodes:  axillary nodes normal. No abnormal inguinal nodes palpated Neurologic: Grossly normal   Pelvic: External genitalia:  no lesions              Urethra:  normal appearing urethra with no masses, tenderness or lesions              Bartholins and Skenes: normal                 Vagina: normal appearing vagina with normal color and discharge, no lesions              Cervix: absent               Bimanual Exam:  Uterus:  uterus absent              Adnexa: no mass, fullness, tenderness                Rectovaginal: Confirms               Anus:  normal sphincter  tone, no lesions  Sharon Harrell chaperoned for the exam.  A:  Well Woman with normal exam  Mixed incontinence, recent negative urine culture  Abdominal discomfort, normal exam, suspect from constipation  Elevated risk of breast cancer  P:   Information on kegels and bladder training given  Mammogram next month  Discussed the recommendation for yearly breast MRI's  Will set up a breast MRI in 5/21  She will contact her mothers Oncologist about his recommendations for genetic testing (she needs to find out if her mother was tested)  No pap needed  Colonoscopy UTD  Labs with primary  Recommended she take miralax daily for the next few weeks to see if it helps her abdominal discomfort. If not she should f/u with her primary.   In addition to the breast and pelvic exam, over 25 minutes was spent in total patient care.

## 2020-09-11 ENCOUNTER — Other Ambulatory Visit: Payer: Self-pay | Admitting: Internal Medicine

## 2020-09-29 ENCOUNTER — Ambulatory Visit
Admission: RE | Admit: 2020-09-29 | Discharge: 2020-09-29 | Disposition: A | Discharge status: Home / Self Care | Payer: Medicare Other | Source: Ambulatory Visit | Attending: Family Medicine | Admitting: Family Medicine

## 2020-09-29 ENCOUNTER — Ambulatory Visit: Payer: Medicare Other

## 2020-09-29 ENCOUNTER — Other Ambulatory Visit: Payer: Self-pay

## 2020-09-29 ENCOUNTER — Other Ambulatory Visit: Payer: Self-pay | Admitting: *Deleted

## 2020-09-29 ENCOUNTER — Other Ambulatory Visit: Payer: Medicare Other

## 2020-09-29 DIAGNOSIS — Z1231 Encounter for screening mammogram for malignant neoplasm of breast: Secondary | ICD-10-CM | POA: Diagnosis not present

## 2020-09-29 DIAGNOSIS — Z9189 Other specified personal risk factors, not elsewhere classified: Secondary | ICD-10-CM

## 2020-10-02 ENCOUNTER — Encounter: Payer: Self-pay | Admitting: Genetic Counselor

## 2020-10-20 ENCOUNTER — Encounter: Payer: Self-pay | Admitting: Family Medicine

## 2020-10-25 DIAGNOSIS — L918 Other hypertrophic disorders of the skin: Secondary | ICD-10-CM | POA: Diagnosis not present

## 2020-10-25 DIAGNOSIS — D225 Melanocytic nevi of trunk: Secondary | ICD-10-CM | POA: Diagnosis not present

## 2020-10-25 DIAGNOSIS — D1801 Hemangioma of skin and subcutaneous tissue: Secondary | ICD-10-CM | POA: Diagnosis not present

## 2020-10-25 DIAGNOSIS — L821 Other seborrheic keratosis: Secondary | ICD-10-CM | POA: Diagnosis not present

## 2020-10-25 DIAGNOSIS — L814 Other melanin hyperpigmentation: Secondary | ICD-10-CM | POA: Diagnosis not present

## 2020-10-25 DIAGNOSIS — D2262 Melanocytic nevi of left upper limb, including shoulder: Secondary | ICD-10-CM | POA: Diagnosis not present

## 2020-10-25 DIAGNOSIS — L718 Other rosacea: Secondary | ICD-10-CM | POA: Diagnosis not present

## 2020-11-21 ENCOUNTER — Other Ambulatory Visit: Payer: Self-pay

## 2020-11-21 ENCOUNTER — Encounter: Payer: Self-pay | Admitting: Internal Medicine

## 2020-11-21 ENCOUNTER — Ambulatory Visit (INDEPENDENT_AMBULATORY_CARE_PROVIDER_SITE_OTHER): Payer: Medicare Other | Admitting: Internal Medicine

## 2020-11-21 VITALS — BP 112/66 | HR 58 | Ht 66.0 in | Wt 132.2 lb

## 2020-11-21 DIAGNOSIS — I493 Ventricular premature depolarization: Secondary | ICD-10-CM

## 2020-11-21 NOTE — Progress Notes (Signed)
HPI Mrs. Leonardo returns today for ongoing evaluation of her PVC's and sob. Since I saw her over a year ago, her dyspnea is improved. Her palpitations have improved. She has been maintained on toprol 25. On the higher dose she cannot feel a difference.She has exercised more this year after retiring. Her dyspnea is improved. No Known Allergies   Current Outpatient Medications  Medication Sig Dispense Refill  . alendronate (FOSAMAX) 70 MG tablet TAKE 1 TABLET BY MOUTH EVERY 7 DAYS. TAKE WITH A FULL GLASS OF WATER ON AN EMPTY STOMACH. 12 tablet 2  . Cholecalciferol (VITAMIN D-3) 5000 UNITS TABS Take 5,000 Units by mouth daily.     Marland Kitchen CRANBERRY PO Take 1 capsule by mouth daily.    . [facility] MAGNESIUM CITRATE PO Take 250 mg by mouth daily.     . Cyanocobalamin (B-12) 5000 MCG SUBL Place under the tongue. Taking twice per month.    . ELDERBERRY PO Take 1 tablet by mouth daily.    Arna Medici 75 MCG tablet Take 1 tablet by mouth once daily 30 tablet 2  . famotidine (PEPCID) 20 MG tablet Take 20 mg by mouth at bedtime.     . fluticasone (FLONASE) 50 MCG/ACT nasal spray Place 1 spray into both nostrils daily as needed for allergies.     . metoprolol succinate (TOPROL-XL) 25 MG 24 hr tablet Take 1 tablet by mouth once daily 90 tablet 0  . metroNIDAZOLE (METROCREAM) 0.75 % cream Apply 1 application topically daily as needed (rosacea).     . mometasone (ELOCON) 0.1 % cream Apply 1 application topically daily as needed (itchy ears).     . Multiple Vitamin (MULTIVITAMIN) tablet Take 1 tablet by mouth daily.    Marland Kitchen olopatadine (PATANOL) 0.1 % ophthalmic solution Place 1 drop into both eyes 2 (two) times daily as needed for allergies.     Marland Kitchen ZINC CITRATE PO Take 2 each by mouth daily.     No current facility-administered medications for this visit.     Past Medical History:  Diagnosis Date  . Cataract   . DDD (degenerative disc disease), cervical 2006  . Depression 1/06   resolved  . Diverticulitis    . Diverticulosis of colon 12/28/2019   Multiple large mouth colonoscopy finding  . Dysphonia   . Frequent PVCs   . Hyperlipidemia   . Hypothyroidism   . Nocturnal leg cramps 06/06/2014  . Non-seasonal allergic rhinitis   . Osteoporosis 2017  . SUI (stress urinary incontinence, female) 12/07  . Vitamin D deficiency disease 11/07    ROS:   All systems reviewed and negative except as noted in the HPI.   Past Surgical History:  Procedure Laterality Date  . BREAST EXCISIONAL BIOPSY Right 1991   benign  . COMBINED HYSTERECTOMY VAGINAL W/ MMK / A&P REPAIR  1983  . HAND SURGERY Left 10/2009   S-L   . LEFT HEART CATH AND CORONARY ANGIOGRAPHY N/A 08/31/2019   Procedure: LEFT HEART CATH AND CORONARY ANGIOGRAPHY;  Surgeon: Martinique, Peter M, MD;  Location: [facility];  Service: Cardiovascular;  Laterality: N/A;  . LIPOSUCTION    . TONSILLECTOMY  as child  . WISDOM TOOTH EXTRACTION       Family History  Problem Relation Age of Onset  . Cancer Mother        cervical/breast  . Osteoarthritis Mother   . Dementia Mother   . Breast cancer Mother 98  bilateral  . Endometriosis Sister   . Heart disease Brother   . Stroke Brother   . Stroke Maternal Grandmother      Social History   Socioeconomic History  . Marital status: Married    Spouse name: Not on file  . Number of children: 2  . Years of education: college-1  . Highest education level: Not on file  Occupational History  . Occupation: Retired     Associate Professor: [employer]  Tobacco Use  . Smoking status: Former Smoker    Packs/day: 0.25    Years: 8.00    Pack years: 2.00    Quit date: 11/19/1971    Years since quitting: 49.0  . Smokeless tobacco: Never Used  Vaping Use  . Vaping Use: Never used  Substance and Sexual Activity  . Alcohol use: Yes    Comment: socially  . Drug use: No  . Sexual activity: Not Currently    Partners: Male    Birth control/protection: Surgical, Post-menopausal     Comment: hysterectomy  Other Topics Concern  . Not on file  Social History Narrative   Recently retired Feb 2021   Social Determinants of Health   Financial Resource Strain: Not on [employer] Insecurity: Not on file  Transportation Needs: Not on file  Physical Activity: Not on file  Stress: Not on file  Social Connections: Not on file  Intimate Partner Violence: Not on file     BP 112/66   Pulse (!) 58   Ht 5\' 6"  (1.676 m)   Wt 132 lb 3.2 oz (60 kg)   LMP 08/18/1982 (Approximate)   SpO2 96%   BMI 21.34 kg/m   Physical Exam:  Well appearing NAD HEENT: Unremarkable Neck:  No JVD, no thyromegally Lymphatics:  No adenopathy Back:  No CVA tenderness Lungs:  Clear HEART:  Regular rate rhythm, no murmurs, no rubs, no clicks Abd:  soft, positive bowel sounds, no organomegally, no rebound, no guarding Ext:  2 plus pulses, no edema, no cyanosis, no clubbing Skin:  No rashes no nodules Neuro:  CN II through XII intact, motor grossly intact  EKG - nsr   Assess/Plan: 1. Dyspnea - her symptoms are improved. I suspect diastolic dysfunction/CHF and recommended she continue her current meds. 2. PVC's - her symptoms are much improved. She will continue toprol.  10/18/1982 Lafreda Casebeer,MD

## 2020-11-21 NOTE — Patient Instructions (Signed)

## 2020-12-19 ENCOUNTER — Other Ambulatory Visit: Payer: Self-pay | Admitting: Family Medicine

## 2020-12-19 ENCOUNTER — Other Ambulatory Visit: Payer: Self-pay | Admitting: Internal Medicine

## 2021-01-01 ENCOUNTER — Encounter (INDEPENDENT_AMBULATORY_CARE_PROVIDER_SITE_OTHER): Payer: Self-pay

## 2021-02-08 ENCOUNTER — Ambulatory Visit (INDEPENDENT_AMBULATORY_CARE_PROVIDER_SITE_OTHER): Payer: Medicare Other

## 2021-02-08 ENCOUNTER — Other Ambulatory Visit: Payer: Self-pay

## 2021-02-08 VITALS — BP 98/60 | HR 69 | Temp 98.3°F | Wt 132.0 lb

## 2021-02-08 DIAGNOSIS — Z0000 Encounter for general adult medical examination without abnormal findings: Secondary | ICD-10-CM

## 2021-02-08 NOTE — Progress Notes (Signed)
Subjective:   Sharon Harrell is a 70 y.o. female who presents for Medicare Annual (Subsequent) preventive examination.  Review of Systems     Cardiac Risk Factors include: advanced age (>64men, >27 women);dyslipidemia     Objective:    Today's Vitals   02/08/21 1126  BP: 98/60  Pulse: 69  Temp: 98.3 F (36.8 C)  SpO2: 95%  Weight: 132 lb (59.9 kg)   Body mass index is 21.31 kg/m.  Advanced Directives 02/08/2021 12/28/2019 08/31/2019  Does Patient Have a Medical Advance Directive? Yes Yes Yes  Type of Advance Directive Healthcare Power of Attorney Living will;Healthcare Power of [place];Living will  Does patient want to make changes to medical advance directive? - No - Patient declined No - Patient declined  Copy of [place] in Chart? Yes - validated most recent copy scanned in chart (See row information) No - copy requested -    Current Medications (verified) Outpatient Encounter Medications as of 02/08/2021  Medication Sig  . alendronate (FOSAMAX) 70 MG tablet TAKE 1 TABLET BY MOUTH EVERY 7 DAYS. TAKE WITH A FULL GLASS OF WATER ON AN EMPTY STOMACH.  . Calcium Carbonate (CALCIUM 600 PO) Take by mouth. 1200 mg  . Cholecalciferol (VITAMIN D-3) 5000 UNITS TABS Take 5,000 Units by mouth daily.   . CVS MAGNESIUM CITRATE PO Take 250 mg by mouth daily.   . Cyanocobalamin (B-12) 5000 MCG SUBL Place under the tongue. Taking twice per month.  Arna Medici 75 MCG tablet Take 1 tablet by mouth once daily  . famotidine (PEPCID) 20 MG tablet Take 20 mg by mouth at bedtime.   . fluticasone (FLONASE) 50 MCG/ACT nasal spray Place 1 spray into both nostrils daily as needed for allergies.   . metoprolol succinate (TOPROL-XL) 25 MG 24 hr tablet Take 1 tablet by mouth once daily  . metroNIDAZOLE (METROCREAM) 0.75 % cream Apply 1 application topically daily as needed (rosacea).   . mometasone (ELOCON) 0.1 % cream Apply 1 application topically daily  as needed (itchy ears).   Marland Kitchen olopatadine (PATANOL) 0.1 % ophthalmic solution Place 1 drop into both eyes 2 (two) times daily as needed for allergies.   Marland Kitchen ZINC CITRATE PO Take 1 each by mouth daily.  Marland Kitchen SHINGRIX injection   . [DISCONTINUED] CRANBERRY PO Take 1 capsule by mouth daily. (Patient not taking: Reported on 02/08/2021)  . [DISCONTINUED] ELDERBERRY PO Take 1 tablet by mouth daily. (Patient not taking: Reported on 02/08/2021)  . [DISCONTINUED] Multiple Vitamin (MULTIVITAMIN) tablet Take 1 tablet by mouth daily. (Patient not taking: Reported on 02/08/2021)   No facility-administered encounter medications on file as of 02/08/2021.    Allergies (verified) Patient has no known allergies.   History: Past Medical History:  Diagnosis Date  . Cataract   . DDD (degenerative disc disease), cervical 2006  . Depression 1/06   resolved  . Diverticulitis   . Diverticulosis of colon 12/28/2019   Multiple large mouth colonoscopy finding  . Dysphonia   . Frequent PVCs   . Hyperlipidemia   . Hypothyroidism   . Nocturnal leg cramps 06/06/2014  . Non-seasonal allergic rhinitis   . Osteoporosis 2017  . SUI (stress urinary incontinence, female) 12/07  . Vitamin D deficiency disease 11/07   Past Surgical History:  Procedure Laterality Date  . BREAST EXCISIONAL BIOPSY Right 1991   benign  . COMBINED HYSTERECTOMY VAGINAL W/ MMK / A&P REPAIR  1983  . HAND SURGERY Left 10/2009  S-L   . LEFT HEART CATH AND CORONARY ANGIOGRAPHY N/A 08/31/2019   Procedure: LEFT HEART CATH AND CORONARY ANGIOGRAPHY;  Surgeon: Martinique, Peter M, MD;  Location: [facility];  Service: Cardiovascular;  Laterality: N/A;  . LIPOSUCTION    . TONSILLECTOMY  as child  . WISDOM TOOTH EXTRACTION     Family History  Problem Relation Age of Onset  . Cancer Mother        cervical/breast  . Osteoarthritis Mother   . Dementia Mother   . Breast cancer Mother 43       bilateral  . Endometriosis Sister   . Heart disease  Brother   . Stroke Brother   . Stroke Maternal Grandmother    Social History   Socioeconomic History  . Marital status: Married    Spouse name: Not on file  . Number of children: 2  . Years of education: college-1  . Highest education level: Not on file  Occupational History  . Occupation: Retired     Fish farm manager: [employer].  Tobacco Use  . Smoking status: Former Smoker    Packs/day: 0.25    Years: 8.00    Pack years: 2.00    Quit date: 11/19/1971    Years since quitting: 49.2  . Smokeless tobacco: Never Used  Vaping Use  . Vaping Use: Never used  Substance and Sexual Activity  . Alcohol use: Yes    Comment: socially  . Drug use: No  . Sexual activity: Not Currently    Partners: Male    Birth control/protection: Surgical, Post-menopausal    Comment: hysterectomy  Other Topics Concern  . Not on file  Social History Narrative   Recently retired Feb 2021   Social Determinants of Health   Financial Resource Strain: [place]   . Difficulty of Paying Living Expenses: Not hard at all  Food Insecurity: No Food Insecurity  . Worried About Charity fundraiser in the Last Year: Never true  . Ran Out of Food in the Last Year: Never true  Transportation Needs: No Transportation Needs  . Lack of Transportation (Medical): No  . Lack of Transportation (Non-Medical): No  Physical Activity: Sufficiently Active  . Days of Exercise per Week: 7 days  . Minutes of Exercise per Session: 50 min  Stress: Stress Concern Present  . Feeling of Stress : To some extent  Social Connections: Moderately Isolated  . Frequency of Communication with Friends and Family: More than three times a week  . Frequency of Social Gatherings with Friends and Family: More than three times a week  . Attends Religious Services: Never  . Active Member of Clubs or Organizations: No  . Attends Archivist Meetings: Never  . Marital Status: Married    Tobacco Counseling Counseling given: Not  Answered   Clinical Intake:  Pre-visit preparation completed: Yes  Pain : No/denies pain     BMI - recorded: 21.31 Nutritional Status: BMI of 19-24  Normal Nutritional Risks: None Diabetes: No  How often do you need to have someone help you when you read instructions, pamphlets, or other written materials from your doctor or pharmacy?: 1 - Never  Diabetic?No  Interpreter Needed?: No  Information entered by :: Charlott Rakes, LPN   Activities of Daily Living In your present state of health, do you have any difficulty performing the following activities: 02/08/2021 08/30/2020  Hearing? Y N  Comment mild loss -  Vision? N N  Difficulty concentrating or  making decisions? Y N  Comment memory at times and remebering -  Walking or climbing stairs? N N  Dressing or bathing? N N  Doing errands, shopping? N N  Preparing Food and eating ? N -  Using the Toilet? N -  In the past six months, have you accidently leaked urine? N -  Do you have problems with loss of bowel control? N -  Managing your Medications? N -  Managing your Finances? N -  Housekeeping or managing your Housekeeping? N -  Some recent data might be hidden    Patient Care Team: Leamon Arnt, MD as PCP - General (Family Medicine) Bo Merino, MD as Consulting Physician (Rheumatology) [facility] as Consulting Physician (Otolaryngology) Merrilee Seashore, MD as Consulting Physician (Internal Medicine) Nicki Reaper, Chipper Oman, PA-C (Inactive) as Physician Assistant (Hematology and Oncology) Juanita Craver, MD as Consulting Physician (Gastroenterology) Evans Lance, MD as Consulting Physician (Cardiology) Dorothy Spark, MD as Consulting Physician (Cardiology) Salvadore Dom, MD as Consulting Physician (Obstetrics and Gynecology) Sydnee Levans, MD as Consulting Physician (Dermatology) Kathrynn Ducking, MD as Consulting Physician (Neurology)  Indicate any recent  Medical Services you may have received from other than [facility] providers in the past year (date may be approximate).     Assessment:   This is a routine wellness examination for [facility].  Hearing/Vision screen  Hearing Screening   125Hz  250Hz  500Hz  1000Hz  2000Hz  3000Hz  4000Hz  6000Hz  8000Hz   Right ear:           Left ear:           Comments: Pt states mild loss   Vision Screening Comments: Pt follows up with my eye Dr for annual eye exams   Dietary issues and exercise activities discussed: Current Exercise Habits: Home exercise routine, Type of exercise: walking, Time (Minutes): 50, Frequency (Times/Week): 7, Weekly Exercise (Minutes/Week): 350  Goals    . Patient Stated     Stay active after retirement and learn new hobbies     . Patient Stated     Go into the [employer] a couple times a week       Depression Screen PHQ 2/9 Scores 02/08/2021 08/30/2020 12/28/2019 09/06/2019  PHQ - 2 Score 0 0 0 1  PHQ- 9 Score - - - 1    Fall Risk Fall Risk  02/08/2021 08/30/2020 12/28/2019 02/20/2018  Falls in the past year? 1 0 1 Yes  Number falls in past yr: 1 0 0 1  Injury with Fall? 1 0 0 No  Comment left hip still sore and tripped on vacum cord - - -  Risk for fall due to : Impaired vision;History of fall(s) - - Other (Comment)  Follow up Falls prevention discussed - Falls evaluation completed;Education provided Falls prevention discussed    FALL RISK PREVENTION PERTAINING TO THE HOME:  Any stairs in or around the home? Yes  If so, are there any without handrails? No  Home free of loose throw rugs in walkways, pet beds, electrical cords, etc? Yes  Adequate lighting in your home to reduce risk of falls? Yes   ASSISTIVE DEVICES UTILIZED TO PREVENT FALLS:  Life alert? No  Use of a cane, walker or w/c? No  Grab bars in the bathroom? Yes  Shower chair or bench in shower? Yes  Elevated toilet seat or a handicapped toilet? Yes   TIMED UP AND GO:  Was the test performed? Yes .  Length of time  to ambulate 10 feet: 10 sec.   Gait steady and fast without use of assistive device  Cognitive Function:     6CIT Screen 02/08/2021  What Year? 0 points  What month? 0 points  Count back from 20 0 points  Months in reverse 0 points  Repeat phrase 0 points    Immunizations Immunization History  Administered Date(s) Administered  . Influenza, High Dose Seasonal PF 08/22/2017, 08/31/2018, 09/08/2019  . Influenza-Unspecified 08/22/2017  . PFIZER(Purple Top)SARS-COV-2 Vaccination 01/29/2020, 02/23/2020  . Pneumococcal Conjugate-13 11/02/2018  . Pneumococcal Polysaccharide-23 12/28/2019  . Td 02/25/2019  . Zoster 04/18/2012  . Zoster Recombinat (Shingrix) 10/16/2020    TDAP status: Up to date  Flu Vaccine status: Declined, Education has been provided regarding the importance of this vaccine but patient still declined. Advised may receive this vaccine at local pharmacy or Health Dept. Aware to provide a copy of the vaccination record if obtained from local pharmacy or Health Dept. Verbalized acceptance and understanding.  Pneumococcal vaccine status: Up to date  Covid-19 vaccine status: Completed vaccines  Qualifies for Shingles Vaccine? Yes   Zostavax completed Yes   Shingrix Completed?: Yes  Screening Tests Health Maintenance  Topic Date Due  . COVID-19 Vaccine (3 - Booster for Pfizer series) 08/24/2020  . INFLUENZA VACCINE  02/15/2021 (Originally 06/18/2020)  . MAMMOGRAM  09/29/2021  . DEXA SCAN  08/16/2022  . TETANUS/TDAP  02/24/2029  . COLONOSCOPY (Pts 45-59yrs Insurance coverage will need to be confirmed)  07/13/2029  . Hepatitis C Screening  Completed  . PNA vac Low Risk Adult  Completed  . HPV VACCINES  Aged Out    Health Maintenance  Health Maintenance Due  Topic Date Due  . COVID-19 Vaccine (3 - Booster for Pfizer series) 08/24/2020    Colorectal cancer screening: Type of screening: Colonoscopy. Completed 07/14/19. Repeat every 10 years  Mammogram status:  Completed 09/29/20. Repeat every year  Bone Density status: Completed 08/16/20. Results reflect: Bone density results: OSTEOPOROSIS. Repeat every 2 years.  Additional Screening:  Hepatitis C Screening:  Completed 09/18/16  Vision Screening: Recommended annual ophthalmology exams for early detection of glaucoma and other disorders of the eye. Is the patient up to date with their annual eye exam?  Yes  Who is the provider or what is the name of the office in which the patient attends annual eye exams? [facility]  If pt is not established with a provider, would they like to be referred to a provider to establish care? No .   Dental Screening: Recommended annual dental exams for proper oral hygiene  Community Resource Referral / Chronic Care Management: CRR required this visit?  No   CCM required this visit?  No      Plan:     I have personally reviewed and noted the following in the patient's chart:   . Medical and social history . Use of alcohol, tobacco or illicit drugs  . Current medications and supplements . Functional ability and status . Nutritional status . Physical activity . Advanced directives . List of other physicians . Hospitalizations, surgeries, and ER visits in previous 12 months . Vitals . Screenings to include cognitive, depression, and falls . Referrals and appointments  In addition, I have reviewed and discussed with patient certain preventive protocols, quality metrics, and best practice recommendations. A written personalized care plan for preventive services as well as general preventive health recommendations were provided to patient.     Willette Brace, LPN  02/08/2021   Nurse Notes: None

## 2021-02-08 NOTE — Patient Instructions (Addendum)
[name] , Thank you for taking time to come for your Medicare Wellness Visit. I appreciate your ongoing commitment to your health goals. Please review the following plan we discussed and let me know if I can assist you in the future.   Screening recommendations/referrals: Colonoscopy: Done 07/13/20 Mammogram: Done 09/29/20 Bone Density: Done 08/16/20 Recommended yearly ophthalmology/optometry visit for glaucoma screening and checkup Recommended yearly dental visit for hygiene and checkup  Vaccinations: Influenza vaccine: Declined and discussed  Pneumococcal vaccine: Up to date Tdap vaccine: Up to date Shingles vaccine: 1st dose 10/16/20   Covid-19:Completed 3/13 & 02/23/20 declined booster   Advanced directives: Copies in chart   Conditions/risks identified: start going to the [facility] twice a week   Next appointment: Follow up in one year for your annual wellness visit    Preventive Care 69 Years and Older, Female Preventive care refers to lifestyle choices and visits with your health care provider that can promote health and wellness. What does preventive care include?  A yearly physical exam. This is also called an annual well check.  Dental exams once or twice a year.  Routine eye exams. Ask your health care provider how often you should have your eyes checked.  Personal lifestyle choices, including:  Daily care of your teeth and gums.  Regular physical activity.  Eating a healthy diet.  Avoiding tobacco and drug use.  Limiting alcohol use.  Practicing safe sex.  Taking low-dose aspirin every day.  Taking vitamin and mineral supplements as recommended by your health care provider. What happens during an annual well check? The services and screenings done by your health care provider during your annual well check will depend on your age, overall health, lifestyle risk factors, and family history of disease. Counseling  Your health care provider may ask you questions  about your:  Alcohol use.  Tobacco use.  Drug use.  Emotional well-being.  Home and relationship well-being.  Sexual activity.  Eating habits.  History of falls.  Memory and ability to understand (cognition).  Work and work Statistician.  Reproductive health. Screening  You may have the following tests or measurements:  Height, weight, and BMI.  Blood pressure.  Lipid and cholesterol levels. These may be checked every 5 years, or more frequently if you are over 34 years old.  Skin check.  Lung cancer screening. You may have this screening every year starting at age 23 if you have a 30-pack-year history of smoking and currently smoke or have quit within the past 15 years.  Fecal occult blood test (FOBT) of the stool. You may have this test every year starting at age 63.  Flexible sigmoidoscopy or colonoscopy. You may have a sigmoidoscopy every 5 years or a colonoscopy every 10 years starting at age 61.  Hepatitis C blood test.  Hepatitis B blood test.  Sexually transmitted disease (STD) testing.  Diabetes screening. This is done by checking your blood sugar (glucose) after you have not eaten for a while (fasting). You may have this done every 1-3 years.  Bone density scan. This is done to screen for osteoporosis. You may have this done starting at age 24.  Mammogram. This may be done every 1-2 years. Talk to your health care provider about how often you should have regular mammograms. Talk with your health care provider about your test results, treatment options, and if necessary, the need for more tests. Vaccines  Your health care provider may recommend certain vaccines, such as:  Influenza vaccine.  This is recommended every year.  Tetanus, diphtheria, and acellular pertussis (Tdap, Td) vaccine. You may need a Td booster every 10 years.  Zoster vaccine. You may need this after age 50.  Pneumococcal 13-valent conjugate (PCV13) vaccine. One dose is  recommended after age 79.  Pneumococcal polysaccharide (PPSV23) vaccine. One dose is recommended after age 75. Talk to your health care provider about which screenings and vaccines you need and how often you need them. This information is not intended to replace advice given to you by your health care provider. Make sure you discuss any questions you have with your health care provider. Document Released: 12/01/2015 Document Revised: 07/24/2016 Document Reviewed: 09/05/2015 Elsevier Interactive Patient Education  2017 [employer] Prevention in the Home Falls can cause injuries. They can happen to people of all ages. There are many things you can do to make your home safe and to help prevent falls. What can I do on the outside of my home?  Regularly fix the edges of walkways and driveways and fix any cracks.  Remove anything that might make you trip as you walk through a door, such as a raised step or threshold.  Trim any bushes or trees on the path to your home.  Use bright outdoor lighting.  Clear any walking paths of anything that might make someone trip, such as rocks or tools.  Regularly check to see if handrails are loose or broken. Make sure that both sides of any steps have handrails.  Any raised decks and porches should have guardrails on the edges.  Have any leaves, snow, or ice cleared regularly.  Use sand or salt on walking paths during winter.  Clean up any spills in your garage right away. This includes oil or grease spills. What can I do in the bathroom?  Use night lights.  Install grab bars by the toilet and in the tub and shower. Do not use towel bars as grab bars.  Use non-skid mats or decals in the tub or shower.  If you need to sit down in the shower, use a plastic, non-slip stool.  Keep the floor dry. Clean up any water that spills on the floor as soon as it happens.  Remove soap buildup in the tub or shower regularly.  Attach bath mats  securely with double-sided non-slip rug tape.  Do not have throw rugs and other things on the floor that can make you trip. What can I do in the bedroom?  Use night lights.  Make sure that you have a light by your bed that is easy to reach.  Do not use any sheets or blankets that are too big for your bed. They should not hang down onto the floor.  Have a firm chair that has side arms. You can use this for support while you get dressed.  Do not have throw rugs and other things on the floor that can make you trip. What can I do in the kitchen?  Clean up any spills right away.  Avoid walking on wet floors.  Keep items that you use a lot in easy-to-reach places.  If you need to reach something above you, use a strong step stool that has a grab bar.  Keep electrical cords out of the way.  Do not use floor polish or wax that makes floors slippery. If you must use wax, use non-skid floor wax.  Do not have throw rugs and other things on the floor that can make  you trip. What can I do with my stairs?  Do not leave any items on the stairs.  Make sure that there are handrails on both sides of the stairs and use them. Fix handrails that are broken or loose. Make sure that handrails are as long as the stairways.  Check any carpeting to make sure that it is firmly attached to the stairs. Fix any carpet that is loose or worn.  Avoid having throw rugs at the top or bottom of the stairs. If you do have throw rugs, attach them to the floor with carpet tape.  Make sure that you have a light switch at the top of the stairs and the bottom of the stairs. If you do not have them, ask someone to add them for you. What else can I do to help prevent falls?  Wear shoes that:  Do not have high heels.  Have rubber bottoms.  Are comfortable and fit you well.  Are closed at the toe. Do not wear sandals.  If you use a stepladder:  Make sure that it is fully opened. Do not climb a closed  stepladder.  Make sure that both sides of the stepladder are locked into place.  Ask someone to hold it for you, if possible.  Clearly mark and make sure that you can see:  Any grab bars or handrails.  First and last steps.  Where the edge of each step is.  Use tools that help you move around (mobility aids) if they are needed. These include:  Canes.  Walkers.  Scooters.  Crutches.  Turn on the lights when you go into a dark area. Replace any light bulbs as soon as they burn out.  Set up your furniture so you have a clear path. Avoid moving your furniture around.  If any of your floors are uneven, fix them.  If there are any pets around you, be aware of where they are.  Review your medicines with your doctor. Some medicines can make you feel dizzy. This can increase your chance of falling. Ask your doctor what other things that you can do to help prevent falls. This information is not intended to replace advice given to you by your health care provider. Make sure you discuss any questions you have with your health care provider. Document Released: 08/31/2009 Document Revised: 04/11/2016 Document Reviewed: 12/09/2014 Elsevier Interactive Patient Education  2017 [employer].

## 2021-02-26 DIAGNOSIS — L820 Inflamed seborrheic keratosis: Secondary | ICD-10-CM | POA: Diagnosis not present

## 2021-02-26 DIAGNOSIS — D225 Melanocytic nevi of trunk: Secondary | ICD-10-CM | POA: Diagnosis not present

## 2021-03-03 ENCOUNTER — Other Ambulatory Visit: Payer: Self-pay | Admitting: Family Medicine

## 2021-03-03 ENCOUNTER — Other Ambulatory Visit: Payer: Self-pay | Admitting: Internal Medicine

## 2021-04-09 DIAGNOSIS — L602 Onychogryphosis: Secondary | ICD-10-CM | POA: Diagnosis not present

## 2021-04-09 DIAGNOSIS — M205X1 Other deformities of toe(s) (acquired), right foot: Secondary | ICD-10-CM | POA: Diagnosis not present

## 2021-04-09 DIAGNOSIS — M792 Neuralgia and neuritis, unspecified: Secondary | ICD-10-CM | POA: Diagnosis not present

## 2021-04-10 ENCOUNTER — Encounter: Payer: Self-pay | Admitting: Obstetrics and Gynecology

## 2021-04-10 ENCOUNTER — Other Ambulatory Visit: Payer: Self-pay

## 2021-04-10 ENCOUNTER — Ambulatory Visit (INDEPENDENT_AMBULATORY_CARE_PROVIDER_SITE_OTHER): Payer: Medicare Other | Admitting: Obstetrics and Gynecology

## 2021-04-10 VITALS — BP 106/62 | HR 66 | Ht 66.0 in | Wt 128.0 lb

## 2021-04-10 DIAGNOSIS — Z9189 Other specified personal risk factors, not elsewhere classified: Secondary | ICD-10-CM

## 2021-04-10 DIAGNOSIS — N6452 Nipple discharge: Secondary | ICD-10-CM

## 2021-04-10 NOTE — Progress Notes (Signed)
GYNECOLOGY  VISIT   HPI: 70 y.o.   Married White or Caucasian Not Hispanic or Latino  female   [phone] with [name] last menstrual period was 08/18/1982 (approximate).   here for bilateral nipple discoloration. She says on her right breast she has pulled a clear fiber out of her nipple.   Her nipples will intermittently turn white at the tip. Two times she had discomfort in her right nipple and had a thin filamentous fiber coming out of her right nipple. She pulled it out with a tweezers, several mm in length, firm.  Last mammogram was in 11/21, benign.   Last breast MRI was in 4/19.   GYNECOLOGIC HISTORY: [name] last menstrual period was 08/18/1982 (approximate). Contraception:PMP, hysterectomy Menopausal hormone therapy: none        OB History    Gravida  2   Para  2   Term  2   Preterm  0   AB  0   Living  2     SAB  0   IAB  0   Ectopic  0   Multiple  0   Live Births  2              Patient Active Problem List   Diagnosis Date Noted  . Primary osteoarthritis of right hand 04/20/2020  . Acquired hypothyroidism 12/28/2019  . Allergic conjunctivitis 12/28/2019  . Diverticulosis of colon 12/28/2019  . Rosacea 12/28/2019  . Mixed hyperlipidemia 08/31/2019  . Esophageal dysphagia 07/01/2019  . Gastroesophageal reflux disease 07/01/2019  . Chronic idiopathic constipation 07/01/2019  . Vitreous degeneration of both eyes 02/18/2019  . Frequent PVCs 10/07/2018  . Chronic allergic rhinitis 02/21/2018  . Family history of breast cancer 02/21/2018  . Hepatic cyst 02/21/2018  . Laryngopharyngeal reflux (LPR) 07/16/2017  . Osteoporosis 07/16/2017  . Nocturnal leg cramps 06/06/2014    Past Medical History:  Diagnosis Date  . Cataract   . DDD (degenerative disc disease), cervical 2006  . Depression 1/06   resolved  . Diverticulitis   . Diverticulosis of colon 12/28/2019   Multiple large mouth colonoscopy finding  . Dysphonia   . Frequent PVCs   .  Hyperlipidemia   . Hypothyroidism   . Nocturnal leg cramps 06/06/2014  . Non-seasonal allergic rhinitis   . Osteoporosis 2017  . SUI (stress urinary incontinence, female) 12/07  . Vitamin D deficiency disease 11/07    Past Surgical History:  Procedure Laterality Date  . BREAST EXCISIONAL BIOPSY Right 1991   benign  . COMBINED HYSTERECTOMY VAGINAL W/ MMK / A&P REPAIR  1983  . HAND SURGERY Left 10/2009   S-L   . LEFT HEART CATH AND CORONARY ANGIOGRAPHY N/A 08/31/2019   Procedure: LEFT HEART CATH AND CORONARY ANGIOGRAPHY;  Surgeon: Martinique, Peter M, MD;  Location: [facility];  Service: Cardiovascular;  Laterality: N/A;  . LIPOSUCTION    . TONSILLECTOMY  as child  . WISDOM TOOTH EXTRACTION      Current Outpatient Medications  Medication Sig Dispense Refill  . alendronate (FOSAMAX) 70 MG tablet TAKE 1 TABLET BY MOUTH EVERY 7 DAYS. TAKE WITH A FULL GLASS OF WATER ON AN EMPTY STOMACH. 12 tablet 2  . Calcium Carbonate (CALCIUM 600 PO) Take by mouth. 1200 mg    . Cholecalciferol (VITAMIN D-3) 5000 UNITS TABS Take 5,000 Units by mouth daily.     . [employer] MAGNESIUM CITRATE PO Take 250 mg by mouth daily.     . Cyanocobalamin (B-12) 5000  MCG SUBL Place under the tongue. Taking twice per month.    Arna Medici 75 MCG tablet Take 1 tablet by mouth once daily 90 tablet 0  . famotidine (PEPCID) 20 MG tablet Take 20 mg by mouth at bedtime.     . fluticasone (FLONASE) 50 MCG/ACT nasal spray Place 1 spray into both nostrils daily as needed for allergies.     . metoprolol succinate (TOPROL-XL) 25 MG 24 hr tablet Take 1 tablet by mouth once daily 90 tablet 2  . metroNIDAZOLE (METROCREAM) 0.75 % cream Apply 1 application topically daily as needed (rosacea).     . mometasone (ELOCON) 0.1 % cream Apply 1 application topically daily as needed (itchy ears).     Marland Kitchen olopatadine (PATANOL) 0.1 % ophthalmic solution Place 1 drop into both eyes 2 (two) times daily as needed for allergies.      No current  facility-administered medications for this visit.     ALLERGIES: Patient has no known allergies.  Family History  Problem Relation Age of Onset  . Cancer Mother        cervical/breast  . Osteoarthritis Mother   . Dementia Mother   . Breast cancer Mother 55       bilateral  . Endometriosis Sister   . Heart disease Brother   . Stroke Brother   . Stroke Maternal Grandmother     Social History   Socioeconomic History  . Marital status: Married    Spouse name: Not on file  . Number of children: 2  . Years of education: college-1  . Highest education level: Not on file  Occupational History  . Occupation: Retired     Fish farm manager: [employer]  Tobacco Use  . Smoking status: Former Smoker    Packs/day: 0.25    Years: 8.00    Pack years: 2.00    Quit date: 11/19/1971    Years since quitting: 49.4  . Smokeless tobacco: Never Used  Vaping Use  . Vaping Use: Never used  Substance and Sexual Activity  . Alcohol use: Yes    Comment: socially  . Drug use: No  . Sexual activity: Not Currently    Partners: Male    Birth control/protection: Surgical, Post-menopausal    Comment: hysterectomy  Other Topics Concern  . Not on file  Social History Narrative   Recently retired Feb 2021   Social Determinants of Health   Financial Resource Strain: [place]   . Difficulty of Paying Living Expenses: Not hard at all  Food Insecurity: No Food Insecurity  . Worried About Charity fundraiser in the Last Year: Never true  . Ran Out of Food in the Last Year: Never true  Transportation Needs: No Transportation Needs  . Lack of Transportation (Medical): No  . Lack of Transportation (Non-Medical): No  Physical Activity: Sufficiently Active  . Days of Exercise per Week: 7 days  . Minutes of Exercise per Session: 50 min  Stress: Stress Concern Present  . Feeling of Stress : To some extent  Social Connections: Moderately Isolated  . Frequency of Communication with Friends and  Family: More than three times a week  . Frequency of Social Gatherings with Friends and Family: More than three times a week  . Attends Religious Services: Never  . Active Member of Clubs or Organizations: No  . Attends Archivist Meetings: Never  . Marital Status: Married  Human resources officer Violence: Not At Risk  . Fear of Current or Ex-Partner:  No  . Emotionally Abused: No  . Physically Abused: No  . Sexually Abused: No    Review of Systems  Genitourinary:       Nipple discoloration   All other systems reviewed and are negative.   PHYSICAL EXAMINATION:    BP 106/62   Pulse 66   Ht 5\' 6"  (1.676 m)   Wt 128 lb (58.1 kg)   LMP 08/18/1982 (Approximate)   SpO2 100%   BMI 20.66 kg/m     General appearance: alert, cooperative and appears stated age Breasts: evidence of right lumpectomy, no distinct lumps in either breast, the right nipple appears slightly crusty. Unable to express any discharge.   No supraclavicular or axillary adenopathy.  1. Discharge from right nipple Two episodes of firm d/c from her right nipple, one duct Needs diagnostic breast imaging on the right  2. Increased risk of breast cancer After her diagnostic breast imaging she needs to be set up for a breast MRI.   ~20 minutes in total patient care.

## 2021-04-12 ENCOUNTER — Telehealth: Payer: Self-pay | Admitting: *Deleted

## 2021-04-12 DIAGNOSIS — N6452 Nipple discharge: Secondary | ICD-10-CM

## 2021-04-12 DIAGNOSIS — Z803 Family history of malignant neoplasm of breast: Secondary | ICD-10-CM

## 2021-04-12 NOTE — Telephone Encounter (Signed)
Patient scheduled on 05/18/21 @ 7:40am at breast center. Patient aware of time and date, aware she can call daily/weekly to check for any cancellations.

## 2021-04-12 NOTE — Telephone Encounter (Signed)
-----   Message from Salvadore Dom, MD sent at 04/10/2021 12:12 PM EDT ----- Please set this patient up for diagnostic breast imaging on the right. She has unilateral nipple discharge. Increased risk of breast cancer.

## 2021-04-17 ENCOUNTER — Encounter: Payer: Self-pay | Admitting: Obstetrics and Gynecology

## 2021-04-17 DIAGNOSIS — N3941 Urge incontinence: Secondary | ICD-10-CM | POA: Insufficient documentation

## 2021-04-17 DIAGNOSIS — N390 Urinary tract infection, site not specified: Secondary | ICD-10-CM | POA: Insufficient documentation

## 2021-04-17 NOTE — Telephone Encounter (Signed)
Yes, I agree. I would recommend that she go to Urgent Care. Thank you!

## 2021-04-17 NOTE — Telephone Encounter (Signed)
Dr. Talbert Nan, [place] concerns?

## 2021-04-21 ENCOUNTER — Emergency Department (HOSPITAL_COMMUNITY): Payer: Medicare Other

## 2021-04-21 ENCOUNTER — Emergency Department (HOSPITAL_COMMUNITY)
Admission: EM | Admit: 2021-04-21 | Discharge: 2021-04-21 | Disposition: A | Discharge status: Home / Self Care | Payer: Medicare Other | Attending: Emergency Medicine | Admitting: Emergency Medicine

## 2021-04-21 ENCOUNTER — Other Ambulatory Visit: Payer: Self-pay

## 2021-04-21 ENCOUNTER — Encounter (HOSPITAL_COMMUNITY): Payer: Self-pay | Admitting: Emergency Medicine

## 2021-04-21 DIAGNOSIS — Z20822 Contact with and (suspected) exposure to covid-19: Secondary | ICD-10-CM | POA: Diagnosis not present

## 2021-04-21 DIAGNOSIS — R072 Precordial pain: Secondary | ICD-10-CM | POA: Insufficient documentation

## 2021-04-21 DIAGNOSIS — R0789 Other chest pain: Secondary | ICD-10-CM | POA: Diagnosis not present

## 2021-04-21 DIAGNOSIS — Z87891 Personal history of nicotine dependence: Secondary | ICD-10-CM | POA: Diagnosis not present

## 2021-04-21 DIAGNOSIS — Z9861 Coronary angioplasty status: Secondary | ICD-10-CM | POA: Diagnosis not present

## 2021-04-21 DIAGNOSIS — M542 Cervicalgia: Secondary | ICD-10-CM | POA: Diagnosis not present

## 2021-04-21 DIAGNOSIS — E039 Hypothyroidism, unspecified: Secondary | ICD-10-CM | POA: Insufficient documentation

## 2021-04-21 LAB — BASIC METABOLIC PANEL
Anion gap: 6 (ref 5–15)
BUN: 17 mg/dL (ref 8–23)
CO2: 28 mmol/L (ref 22–32)
Calcium: 9.3 mg/dL (ref 8.9–10.3)
Chloride: 103 mmol/L (ref 98–111)
Creatinine, Ser: 0.8 mg/dL (ref 0.44–1.00)
GFR, Estimated: >60 mL/min (ref >60)
Glucose, Bld: 104 mg/dL — ABNORMAL HIGH (ref 70–99)
Potassium: 4 mmol/L (ref 3.5–5.1)
Sodium: 137 mmol/L (ref 135–145)

## 2021-04-21 LAB — CBC
HCT: 43.6 % (ref 36.0–46.0)
Hemoglobin: 15 g/dL (ref 12.0–15.0)
MCH: 31.2 pg (ref 26.0–34.0)
MCHC: 34.4 g/dL (ref 30.0–36.0)
MCV: 90.6 fL (ref 80.0–100.0)
Platelets: 214 10*3/uL (ref 150–400)
RBC: 4.81 MIL/uL (ref 3.87–5.11)
RDW: 12.9 % (ref 11.5–15.5)
WBC: 3.5 10*3/uL — ABNORMAL LOW (ref 4.0–10.5)
nRBC: 0 % (ref 0.0–0.2)

## 2021-04-21 LAB — TROPONIN I (HIGH SENSITIVITY)
Troponin I (High Sensitivity): 3 ng/L (ref <18)
Troponin I (High Sensitivity): 4 ng/L (ref <18)

## 2021-04-21 MED ORDER — NITROGLYCERIN 0.4 MG SL SUBL
0.4000 mg | SUBLINGUAL_TABLET | SUBLINGUAL | Status: DC | PRN
  Filled 2021-04-21: qty 1

## 2021-04-21 MED ORDER — ASPIRIN 81 MG PO CHEW
324.0000 mg | CHEWABLE_TABLET | Freq: Once | ORAL | Status: AC
  Administered 2021-04-21: 324 mg via ORAL
  Filled 2021-04-21: qty 4

## 2021-04-21 NOTE — Discharge Instructions (Addendum)
We signed the ER for chest pain.  The cardiac work-up is normal.  We have tested you for COVID-19 because of your exposure, the results will take 6 to 24 hours to result, and the results can be reviewed on MyChart.  If your COVID-19 test is positive, you have up to 5 days to possibly start antiviral called Paxlvid -if you test positive, please contact your primary care doctor to see if the antiviral is a right choice for you.

## 2021-04-21 NOTE — ED Triage Notes (Signed)
Pt has upper chest and neck pain that radiates to her jaw and left arm. The pain is described as pressure.  Pt states her left arm feels like its being squeezed by a BP cuff.

## 2021-04-21 NOTE — ED Notes (Signed)
Pt ambulated to the bathroom unassisted with an even steady gait.

## 2021-04-21 NOTE — ED Provider Notes (Signed)
[facility] EMERGENCY DEPARTMENT Provider Note   CSN: [id number] Arrival date & time: 04/21/21  7096     History Chief Complaint  Patient presents with  . Chest Pain    Sharon Harrell is a 70 y.o. female.  HPI  HPI: A 70 year old patient with a history of hypercholesterolemia presents for evaluation of chest pain. Initial onset of pain was approximately 1-3 hours ago. The patient's chest pain is described as heaviness/pressure/tightness and is not worse with exertion. The patient's chest pain is middle- or left-sided, is not well-localized, is not sharp and does radiate to the arms/jaw/neck. The patient does not complain of nausea and denies diaphoresis. The patient has no history of stroke, has no history of peripheral artery disease, has not smoked in the past 90 days, denies any history of treated diabetes, has no relevant family history of coronary artery disease (first degree relative at less than age 20), is not hypertensive and does not have an elevated BMI (>=30).   Patient had some nonspecific tightness in her left upper extremity with discomfort, pressure-like to her chest and neck area.  She also had some jaw discomfort.  Symptoms started around 7 or 7:30 AM and have been constant, but not as intense.  She had a catheterization in 2020 which was negative.  Past Medical History:  Diagnosis Date  . Cataract   . DDD (degenerative disc disease), cervical 2006  . Depression 1/06   resolved  . Diverticulitis   . Diverticulosis of colon 12/28/2019   Multiple large mouth colonoscopy finding  . Dysphonia   . Frequent PVCs   . Hyperlipidemia   . Hypothyroidism   . Nocturnal leg cramps 06/06/2014  . Non-seasonal allergic rhinitis   . Osteoporosis 2017  . SUI (stress urinary incontinence, female) 12/07  . Vitamin D deficiency disease 11/07    Patient Active Problem List   Diagnosis Date Noted  . Primary osteoarthritis of right hand 04/20/2020  . Acquired hypothyroidism  12/28/2019  . Allergic conjunctivitis 12/28/2019  . Diverticulosis of colon 12/28/2019  . Rosacea 12/28/2019  . Mixed hyperlipidemia 08/31/2019  . Esophageal dysphagia 07/01/2019  . Gastroesophageal reflux disease 07/01/2019  . Chronic idiopathic constipation 07/01/2019  . Vitreous degeneration of both eyes 02/18/2019  . Frequent PVCs 10/07/2018  . Chronic allergic rhinitis 02/21/2018  . Family history of breast cancer 02/21/2018  . Hepatic cyst 02/21/2018  . Laryngopharyngeal reflux (LPR) 07/16/2017  . Osteoporosis 07/16/2017  . Nocturnal leg cramps 06/06/2014    Past Surgical History:  Procedure Laterality Date  . BREAST EXCISIONAL BIOPSY Right 1991   benign  . COMBINED HYSTERECTOMY VAGINAL W/ MMK / A&P REPAIR  1983  . HAND SURGERY Left 10/2009   S-L   . LEFT HEART CATH AND CORONARY ANGIOGRAPHY N/A 08/31/2019   Procedure: LEFT HEART CATH AND CORONARY ANGIOGRAPHY;  Surgeon: Martinique, Peter M, MD;  Location: [place] [facility];  Service: Cardiovascular;  Laterality: N/A;  . LIPOSUCTION    . TONSILLECTOMY  as child  . WISDOM TOOTH EXTRACTION       OB History    Gravida  2   Para  2   Term  2   Preterm  0   AB  0   Living  2     SAB  0   IAB  0   Ectopic  0   Multiple  0   Live Births  2           Family History  Problem Relation Age of Onset  . Cancer Mother        cervical/breast  . Osteoarthritis Mother   . Dementia Mother   . Breast cancer Mother 86       bilateral  . Endometriosis Sister   . Heart disease Brother   . Stroke Brother   . Stroke Maternal Grandmother     Social History   Tobacco Use  . Smoking status: Former Smoker    Packs/day: 0.25    Years: 8.00    Pack years: 2.00    Quit date: 11/19/1971    Years since quitting: 49.4  . Smokeless tobacco: Never Used  Vaping Use  . Vaping Use: Never used  Substance Use Topics  . Alcohol use: Yes    Comment: socially  . Drug use: No    Home Medications Prior to Admission  medications   Medication Sig Start Date End Date Taking? Authorizing Provider  alendronate (FOSAMAX) 70 MG tablet TAKE 1 TABLET BY MOUTH EVERY 7 DAYS. TAKE WITH A FULL GLASS OF WATER ON AN EMPTY STOMACH. 08/24/20   Leamon Arnt, MD  Calcium Carbonate (CALCIUM 600 PO) Take by mouth. 1200 mg    [provider]  Cholecalciferol (VITAMIN D-3) 5000 UNITS TABS Take 5,000 Units by mouth daily.     [provider]  CVS MAGNESIUM CITRATE PO Take 250 mg by mouth daily.     [provider]  Cyanocobalamin (B-12) 5000 MCG SUBL Place under the tongue. Taking twice per month.    [provider]  EUTHYROX 75 MCG tablet Take 1 tablet by mouth once daily 03/05/21   Leamon Arnt, MD  famotidine (PEPCID) 20 MG tablet Take 20 mg by mouth at bedtime.     [provider]  fluticasone (FLONASE) 50 MCG/ACT nasal spray Place 1 spray into both nostrils daily as needed for allergies.     [provider]  metoprolol succinate (TOPROL-XL) 25 MG 24 hr tablet Take 1 tablet by mouth once daily 03/06/21   Evans Lance, MD  metroNIDAZOLE (METROCREAM) 0.75 % cream Apply 1 application topically daily as needed (rosacea).     [provider]  mometasone (ELOCON) 0.1 % cream Apply 1 application topically daily as needed (itchy ears).     [provider]  olopatadine (PATANOL) 0.1 % ophthalmic solution Place 1 drop into both eyes 2 (two) times daily as needed for allergies.  09/16/16   [provider]    Allergies    Patient has no known allergies.  Review of Systems   Review of Systems  Constitutional: Positive for activity change.  HENT: Positive for rhinorrhea. Negative for sore throat.   Respiratory: Negative for shortness of breath.   Cardiovascular: Positive for chest pain.  Gastrointestinal: Negative for nausea and vomiting.  Allergic/Immunologic: Negative for immunocompromised state.  All other systems reviewed and are  negative.   Physical Exam Updated Vital Signs BP 108/73   Pulse (!) 57   Temp 97.8 F (36.6 C) (Oral)   Resp 14   Wt 58.1 kg   LMP 08/18/1982 (Approximate)   SpO2 100%   BMI 20.66 kg/m   Physical Exam Vitals and nursing note reviewed.  Constitutional:      Appearance: She is well-developed.  HENT:     Head: Normocephalic and atraumatic.  Cardiovascular:     Rate and Rhythm: Normal rate.  Pulmonary:     Effort: Pulmonary effort is normal.  Abdominal:  General: Bowel sounds are normal.  Musculoskeletal:     Cervical back: Normal range of motion and neck supple.     Right lower leg: No edema.     Left lower leg: No edema.  Skin:    General: Skin is warm and dry.  Neurological:     Mental Status: She is alert and oriented to person, place, and time.     ED Results / Procedures / Treatments   Labs (all labs ordered are listed, but only abnormal results are displayed) Labs Reviewed  BASIC METABOLIC PANEL - Abnormal; Notable for the following components:      Result Value   Glucose, Bld 104 (*)    All other components within normal limits  CBC - Abnormal; Notable for the following components:   WBC 3.5 (*)    All other components within normal limits  SARS CORONAVIRUS 2 (TAT 6-24 HRS)  TROPONIN I (HIGH SENSITIVITY)  TROPONIN I (HIGH SENSITIVITY)    EKG EKG Interpretation  Date/Time:  Saturday April 21 2021 08:12:59 EDT Ventricular Rate:  62 PR Interval:  148 QRS Duration: 92 QT Interval:  434 QTC Calculation: 441 R Axis:   -38 Text Interpretation: Sinus rhythm Left axis deviation RSR' in V1 or V2, probably normal variant No acute changes Confirmed by Varney Biles [phone]) on 04/21/2021 9:28:10 AM   Radiology DG Chest 2 View  Result Date: 04/21/2021 CLINICAL DATA:  Upper chest and neck pain. EXAM: CHEST - 2 VIEW COMPARISON:  None. FINDINGS: Normal mediastinum and cardiac silhouette. Normal pulmonary vasculature. No evidence of effusion, infiltrate, or  pneumothorax. No acute bony abnormality. IMPRESSION: No acute cardiopulmonary process. Electronically Signed   By: Suzy Bouchard M.D.   On: 04/21/2021 09:07    Procedures Procedures   Medications Ordered in ED Medications  nitroGLYCERIN (NITROSTAT) SL tablet 0.4 mg (0.4 mg Sublingual Patient Refused/Not Given 04/21/21 1015)  aspirin chewable tablet 324 mg (324 mg Oral Given 04/21/21 1014)    ED Course  I have reviewed the triage vital signs and the nursing notes.  Pertinent labs & imaging results that were available during my care of the patient were reviewed by me and considered in my medical decision making (see chart for details).  Clinical Course as of 04/21/21 1230  Sat Apr 21, 2021  1228 [name] troponins, x-ray, EKG are reassuring.  She was noted to have leukopenia.  When I asked her about possible COVID-19 exposures, she informed me that her husband just found out that patient's sister-in-law tested positive for COVID-19.  Patient denies any URI-like symptoms, shortness of breath, cough, malaise at this time.  She was having some fatigue, but she attributed that to the travel time.  We will get the outpatient COVID-19 test sent.  If she is positive, she will consider Paxlovid with her PCP.  Vaccinated but not boosted, age over 64. [AN]    Clinical Course User Index [AN] Varney Biles, MD   MDM Rules/Calculators/A&P HEAR Score: 79                        70 year old female comes in a chief complaint of chest discomfort.  Nonspecific complaints, that have some features of ACS.  Fortunately, there was a coronary catheterization done less than 2 years ago which was normal.  Delta troponin ordered for hear score of 4.  She has some rhinorrhea, but does not have any other COVID-like prodrome.    Plan is to get delta  troponin and reassess. Anticipate d/c. No PE, DVT risk factors or findings concerning for DVT.  Final Clinical Impression(s) / ED Diagnoses Final diagnoses:   Precordial chest pain  Contact with and (suspected) exposure to covid-19    Rx / DC Orders ED Discharge Orders    None       Varney Biles, MD 04/21/21 1230

## 2021-04-22 LAB — SARS CORONAVIRUS 2 (TAT 6-24 HRS): SARS Coronavirus 2: NEGATIVE

## 2021-04-25 ENCOUNTER — Encounter: Payer: Self-pay | Admitting: Family Medicine

## 2021-04-27 DIAGNOSIS — U071 COVID-19: Secondary | ICD-10-CM | POA: Diagnosis not present

## 2021-05-14 ENCOUNTER — Encounter: Payer: Self-pay | Admitting: Obstetrics and Gynecology

## 2021-05-18 ENCOUNTER — Ambulatory Visit
Admission: RE | Admit: 2021-05-18 | Discharge: 2021-05-18 | Disposition: A | Discharge status: Home / Self Care | Payer: Medicare Other | Source: Ambulatory Visit | Attending: Obstetrics and Gynecology | Admitting: Obstetrics and Gynecology

## 2021-05-18 ENCOUNTER — Other Ambulatory Visit: Payer: Self-pay

## 2021-05-18 DIAGNOSIS — N6452 Nipple discharge: Secondary | ICD-10-CM | POA: Diagnosis not present

## 2021-05-18 DIAGNOSIS — Z803 Family history of malignant neoplasm of breast: Secondary | ICD-10-CM

## 2021-05-18 DIAGNOSIS — R922 Inconclusive mammogram: Secondary | ICD-10-CM | POA: Diagnosis not present

## 2021-06-07 DIAGNOSIS — D225 Melanocytic nevi of trunk: Secondary | ICD-10-CM | POA: Diagnosis not present

## 2021-06-07 DIAGNOSIS — S90211A Contusion of right great toe with damage to nail, initial encounter: Secondary | ICD-10-CM | POA: Diagnosis not present

## 2021-06-07 DIAGNOSIS — B070 Plantar wart: Secondary | ICD-10-CM | POA: Diagnosis not present

## 2021-06-12 ENCOUNTER — Other Ambulatory Visit: Payer: Self-pay | Admitting: Family Medicine

## 2021-06-28 DIAGNOSIS — N6452 Nipple discharge: Secondary | ICD-10-CM | POA: Insufficient documentation

## 2021-06-29 DIAGNOSIS — R922 Inconclusive mammogram: Secondary | ICD-10-CM | POA: Diagnosis not present

## 2021-06-29 DIAGNOSIS — Z803 Family history of malignant neoplasm of breast: Secondary | ICD-10-CM | POA: Diagnosis not present

## 2021-06-29 DIAGNOSIS — N6452 Nipple discharge: Secondary | ICD-10-CM | POA: Diagnosis not present

## 2021-07-02 ENCOUNTER — Other Ambulatory Visit: Payer: Self-pay | Admitting: General Surgery

## 2021-07-02 DIAGNOSIS — Z803 Family history of malignant neoplasm of breast: Secondary | ICD-10-CM

## 2021-07-02 DIAGNOSIS — N6452 Nipple discharge: Secondary | ICD-10-CM

## 2021-07-02 DIAGNOSIS — R922 Inconclusive mammogram: Secondary | ICD-10-CM

## 2021-07-03 ENCOUNTER — Telehealth: Payer: Self-pay | Admitting: Genetic Counselor

## 2021-07-03 NOTE — Telephone Encounter (Signed)
Received a genetic counseling referral from Dr. Barry Dienes for fhx of breast cancer in mother. Sharon Harrell has been cld and scheduled to see. Santiago Glad on 8/31 at 1pm. Pt aware to arrive 15 minutes early.

## 2021-07-12 ENCOUNTER — Ambulatory Visit
Admission: RE | Admit: 2021-07-12 | Discharge: 2021-07-12 | Disposition: A | Discharge status: Home / Self Care | Payer: Medicare Other | Source: Ambulatory Visit | Attending: General Surgery | Admitting: General Surgery

## 2021-07-12 DIAGNOSIS — N6452 Nipple discharge: Secondary | ICD-10-CM

## 2021-07-12 DIAGNOSIS — Z803 Family history of malignant neoplasm of breast: Secondary | ICD-10-CM

## 2021-07-12 DIAGNOSIS — R922 Inconclusive mammogram: Secondary | ICD-10-CM

## 2021-07-12 MED ORDER — GADOBUTROL 1 MMOL/ML IV SOLN
6.0000 mL | Freq: Once | INTRAVENOUS | Status: AC | PRN
  Administered 2021-07-12: 6 mL via INTRAVENOUS

## 2021-07-18 ENCOUNTER — Other Ambulatory Visit: Payer: Medicare Other

## 2021-07-18 ENCOUNTER — Encounter: Payer: Medicare Other | Admitting: Genetic Counselor

## 2021-07-25 ENCOUNTER — Inpatient Hospital Stay: Payer: Medicare Other

## 2021-07-25 ENCOUNTER — Inpatient Hospital Stay: Payer: Medicare Other | Admitting: Genetic Counselor

## 2021-07-26 ENCOUNTER — Inpatient Hospital Stay: Payer: Medicare Other

## 2021-07-26 ENCOUNTER — Other Ambulatory Visit: Payer: Self-pay

## 2021-07-26 ENCOUNTER — Inpatient Hospital Stay: Payer: Medicare Other | Attending: Genetic Counselor | Admitting: Genetic Counselor

## 2021-07-26 DIAGNOSIS — Z800 Family history of malignant neoplasm of digestive organs: Secondary | ICD-10-CM | POA: Diagnosis not present

## 2021-07-26 DIAGNOSIS — Z803 Family history of malignant neoplasm of breast: Secondary | ICD-10-CM | POA: Diagnosis not present

## 2021-07-26 LAB — GENETIC SCREENING ORDER

## 2021-07-30 ENCOUNTER — Encounter: Payer: Self-pay | Admitting: Genetic Counselor

## 2021-07-30 NOTE — Progress Notes (Signed)
REFERRING PROVIDER: Stark Klein, MD [address] [place],  [place] [address]  PRIMARY PROVIDER:  Leamon Arnt, MD  PRIMARY REASON FOR VISIT:  1. Family history of breast cancer     HISTORY OF PRESENT ILLNESS:   Sharon Harrell, a 70 y.o. female, was seen for a [place] cancer genetics consultation at the request of Dr. Barry Dienes due to a family history of cancer.  Sharon Harrell presents to clinic today to discuss the possibility of a hereditary predisposition to cancer, to discuss genetic testing, and to further clarify her future cancer risks, as well as potential cancer risks for family members.   Sharon Harrell is a 70 y.o. female with no personal history of cancer.    CANCER HISTORY:  Oncology History   No history exists.    RISK FACTORS:  Menarche was at age 78.  First live birth at age 16.  OCP use for approximately  12  years.  Ovaries intact: yes.  Hysterectomy: yes; early 75s Menopausal status: postmenopausal.  HRT use: vaginal ring;  no longer using Colonoscopy: yes; most recent in 20202 Mammogram within the last year: yes. Number of breast biopsies: 2 (benign) Up to date with pelvic exams: yes. Any excessive radiation exposure in the past: no  Past Medical History:  Diagnosis Date   Cataract    DDD (degenerative disc disease), cervical 2006   Depression 1/06   resolved   Diverticulitis    Diverticulosis of colon 12/28/2019   Multiple large mouth colonoscopy finding   Dysphonia    Frequent PVCs    Hyperlipidemia    Hypothyroidism    Nocturnal leg cramps 06/06/2014   Non-seasonal allergic rhinitis    Osteoporosis 2017   SUI (stress urinary incontinence, female) 12/07   Vitamin D deficiency disease 11/07    Past Surgical History:  Procedure Laterality Date   BREAST EXCISIONAL BIOPSY Right 1991   benign   COMBINED HYSTERECTOMY VAGINAL W/ MMK / A&P REPAIR  1983   HAND SURGERY Left 10/2009   S-L    LEFT HEART CATH AND CORONARY ANGIOGRAPHY N/A 08/31/2019    Procedure: LEFT HEART CATH AND CORONARY ANGIOGRAPHY;  Surgeon: Martinique, Peter M, MD;  Location: [facility];  Service: Cardiovascular;  Laterality: N/A;   LIPOSUCTION     TONSILLECTOMY  as child   WISDOM TOOTH EXTRACTION      Social History   Socioeconomic History   Marital status: Married    Spouse name: Not on file   Number of children: 2   Years of education: college-1   Highest education level: Not on file  Occupational History   Occupation: Retired     Fish farm manager: [employer].  Tobacco Use   Smoking status: Former    Packs/day: 0.25    Years: 8.00    Pack years: 2.00    Types: Cigarettes    Quit date: 11/19/1971    Years since quitting: 49.7   Smokeless tobacco: Never  Vaping Use   Vaping Use: Never used  Substance and Sexual Activity   Alcohol use: Yes    Comment: socially   Drug use: No   Sexual activity: Not Currently    Partners: Male    Birth control/protection: Surgical, Post-menopausal    Comment: hysterectomy  Other Topics Concern   Not on file  Social History Narrative   Recently retired Feb 2021   Social Determinants of Health   Financial Resource Strain: Uncertain  Difficulty of Paying Living Expenses: Not hard at all  Food Insecurity: No Food Insecurity   Worried About [place] in the Last Year: Never true   Ran Out of Food in the Last Year: Never true  Transportation Needs: No Transportation Needs   Lack of Transportation (Medical): No   Lack of Transportation (Non-Medical): No  Physical Activity: Sufficiently Active   Days of Exercise per Week: 7 days   Minutes of Exercise per Session: 50 min  Stress: Stress Concern Present   Feeling of Stress : To some extent  Social Connections: Moderately Isolated   Frequency of Communication with Friends and Family: More than three times a week   Frequency of Social Gatherings with Friends and Family: More than three times a week   Attends Religious Services: Never   Building surveyor or Organizations: No   Attends Music therapist: Never   Marital Status: Married     FAMILY HISTORY:  We obtained a detailed, 4-generation family history.  Significant diagnoses are listed below: Family History  Problem Relation Age of Onset   Cervical cancer Mother 39   Breast cancer Mother        dx early 66s; bilateral   Throat cancer Maternal Uncle 50   Cancer Maternal Uncle 42       unknown type; ? pancreatic   Lung cancer Maternal Uncle 48   Cancer Cousin 56       tonsil; maternal female cousin   Breast cancer Cousin 59       DCIS; maternal female cousin   Colon polyps Son        one polyp; before age 2   Skin cancer Son 34       non-melanoma; shoulder     Sharon Harrell reported that her maternal cousin with a history of breast cancer had previous genetic testing for hereditary cancer and had a positive result.  No report was available for review today.  We discussed the benefits of reviewing family genetic testing reports prior to genetic testing. Sharon Harrell is unaware of other previous family history of genetic testing for hereditary cancer risks.There is no reported Ashkenazi Jewish ancestry. There is no known consanguinity.  GENETIC COUNSELING ASSESSMENT: Ms. Berninger is a 70 y.o. female with a family history of cancer which is somewhat suggestive of a hereditary cancer syndrome and predisposition to cancer given the presence of related cancers in her family. We, therefore, discussed and recommended the following at today's visit.   DISCUSSION: We discussed that 5 - 10% of cancer is hereditary, with most cases of hereditary breast cancer associated with mutations in BRCA1/2.  There are other genes that can be associated with hereditary breast cancer syndromes.  Type of cancer risk and level of risk are gene-specific.  We discussed that testing is beneficial for several reasons, including knowing about other cancer risks, identifying potential screening  and risk-reduction options that may be appropriate, and to understanding if other family members could be at risk for cancer and allowing them to undergo genetic testing.  We reviewed the characteristics, features and inheritance patterns of hereditary cancer syndromes. We also discussed genetic testing, including the appropriate family members to test, the process of testing, insurance coverage and turn-around-time for results. We discussed the implications of a negative, positive, carrier and/or variant of uncertain significant result. We discussed that negative results would be uninformative given that Sharon Harrell does not have a personal history of cancer.  We recommended Sharon Harrell pursue genetic testing for a panel that contains genes associated with breast cancer genes.  Sharon Harrell was offered a common hereditary cancer panel (47 genes) and an expanded pan-cancer panel (84 genes). Sharon Harrell was informed of the benefits and limitations of each panel, including that expanded pan-cancer panels contain several genes that do not have clear management guidelines at this point in time.  We also discussed that as the number of genes included on a panel increases, the chances of variants of uncertain significance increases.  After considering the benefits and limitations of each gene panel, Sharon Harrell elected to have an expanded pan cancer-panel through [facility].  The Multi-Cancer + RNA Panel offered by [facility] includes sequencing and/or deletion/duplication analysis of the following 84 genes:  AIP*, ALK, APC*, ATM*, AXIN2*, BAP1*, BARD1*, BLM*, BMPR1A*, BRCA1*, BRCA2*, BRIP1*, CASR, CDC73*, CDH1*, CDK4, CDKN1B*, CDKN1C*, CDKN2A, CEBPA, CHEK2*, CTNNA1*, DICER1*, DIS3L2*, EGFR, EPCAM, FH*, FLCN*, GATA2*, GPC3, GREM1, HOXB13, HRAS, KIT, MAX*, MEN1*, MET, MITF, MLH1*, MSH2*, MSH3*, MSH6*, MUTYH*, NBN*, NF1*, NF2*, NTHL1*, PALB2*, PDGFRA, PHOX2B, PMS2*, POLD1*, POLE*, POT1*, PRKAR1A*, PTCH1*, PTEN*, RAD50*, RAD51C*,  RAD51D*, RB1*, RECQL4, RET, RUNX1*, SDHA*, SDHAF2*, SDHB*, SDHC*, SDHD*, SMAD4*, SMARCA4*, SMARCB1*, SMARCE1*, STK11*, SUFU*, TERC, TERT, TMEM127*, Tp53*, TSC1*, TSC2*, VHL*, WRN*, and WT1.  RNA analysis is performed for * genes.   Based on Sharon Harrell's family history of cancer, she meets medical criteria for genetic testing. Despite that she meets criteria, she may still have an out of pocket cost. We discussed that if her out of pocket cost for testing is over $100, the laboratory will contact her to discuss self-pay options and/or patient pay assistance programs.   We discussed that some people do not want to undergo genetic testing due to fear of genetic discrimination.  A federal law called the Genetic Information Non-Discrimination Act (GINA) of 2008 helps protect individuals against genetic discrimination based on their genetic test results.  It impacts both health insurance and employment.  With health insurance, it protects against increased premiums, being kicked off insurance or being forced to take a test in order to be insured.  For employment it protects against hiring, firing and promoting decisions based on genetic test results.  GINA does not apply to those in the [employer], those who work for companies with less than 15 employees, and new life insurance or long-term disability insurance policies.  Health status due to a cancer diagnosis is not protected under GINA.  PLAN: After considering the risks, benefits, and limitations, Sharon Harrell provided informed consent to pursue genetic testing and the blood sample was sent to [facility] for analysis of the Multi-Cancer +RNA Panel. Results should be available within approximately 3 weeks' time, at which point they will be disclosed by telephone to Sharon Harrell, as will any additional recommendations warranted by these results. Sharon Harrell will receive a summary of her genetic counseling visit and a copy of her results once available. This  information will also be available in Epic.   Lastly, we encouraged Sharon Harrell to remain in contact with cancer genetics annually so that we can continuously update the family history and inform her of any changes in cancer genetics and testing that may be of benefit for this family.   Sharon Harrell questions were answered to her satisfaction today. Our contact information was provided should additional questions or concerns arise. Thank you for the referral and allowing [place] to share in the care of your patient.   Ronnisha Felber M.  Joette Catching, [place], [facility] Genetic Counselor [email or web address] (P) [phone]   The patient was seen for a total of 40 minutes in face-to-face genetic counseling.  The patient was seen alone.  Drs. Magrinat, Lindi Adie and/or Burr Medico were available to discuss this case as needed.  _______________________________________________________________________ For Office Staff:  Number of people involved in session: 1 Was an Intern/ student involved with case: no

## 2021-08-03 ENCOUNTER — Telehealth: Payer: Self-pay | Admitting: Genetic Counselor

## 2021-08-03 NOTE — Telephone Encounter (Signed)
Sharon Harrell LVM saying that she obtained copy of genetic testing report.  Returned call and LV requesting call back to discuss genetic variant and provided email address to send copy of report.

## 2021-08-14 ENCOUNTER — Ambulatory Visit: Payer: Self-pay | Admitting: Genetic Counselor

## 2021-08-14 ENCOUNTER — Encounter: Payer: Self-pay | Admitting: Genetic Counselor

## 2021-08-14 ENCOUNTER — Telehealth: Payer: Self-pay | Admitting: Genetic Counselor

## 2021-08-14 DIAGNOSIS — Z1379 Encounter for other screening for genetic and chromosomal anomalies: Secondary | ICD-10-CM

## 2021-08-14 DIAGNOSIS — Z803 Family history of malignant neoplasm of breast: Secondary | ICD-10-CM

## 2021-08-14 NOTE — Telephone Encounter (Signed)
Revealed negative genetic testing and variant of uncertain significance in PMS2.  Discussed that we do not know why there is cancer in the family. It could be sporadic/familial, due to a change in a gene she did not inherit, due to a different gene that we are not testing, or maybe our current technology may not be able to pick something up.  It will be important for her to keep in contact with genetics to keep up with whether additional testing may be needed.

## 2021-08-14 NOTE — Telephone Encounter (Signed)
Contacted patient in attempt to disclose results of genetic testing.  LVM with contact information requesting a call back.  

## 2021-08-15 ENCOUNTER — Encounter: Payer: Self-pay | Admitting: Genetic Counselor

## 2021-08-15 NOTE — Progress Notes (Signed)
HPI:  Ms. Postma was previously seen in the [place] clinic due to a family history of breast cancer and concerns regarding a hereditary predisposition to cancer. Please refer to our prior cancer genetics clinic note for more information regarding our discussion, assessment and recommendations, at the time. Ms. Fentress recent genetic test results were disclosed to her, as were recommendations warranted by these results. These results and recommendations are discussed in more detail below.  CANCER HISTORY:  Oncology History   No history exists.    AMILY HISTORY:  We obtained a detailed, 4-generation family history.  Significant diagnoses are listed below:      Family History  Problem Relation Age of Onset   Cervical cancer Mother 39   Breast cancer Mother          dx early 69s; bilateral   Throat cancer Maternal Uncle 89   Cancer Maternal Uncle 9        unknown type; ? pancreatic   Lung cancer Maternal Uncle 32   Cancer Cousin 73        tonsil; maternal female cousin   Breast cancer Cousin 44        DCIS; maternal female cousin   Colon polyps Son          one polyp; before age 72   Skin cancer Son 1        non-melanoma; shoulder      Ms. Tieu reported that her maternal cousin with a history of breast cancer had previous genetic testing for hereditary cancer and had a single heterozygous mutation in MUTYH.  No report was available for review today.  We discussed the benefits of reviewing family genetic testing reports prior to genetic testing. Ms. Courtois is unaware of other previous family history of genetic testing for hereditary cancer risks.There is no reported Ashkenazi Jewish ancestry. There is no known consanguinity.  GENETIC TEST RESULTS: Genetic testing reported out on August 13, 2021. The Multi-Cancer +RNA Panel found no pathogenic mutations. The Multi-Cancer + RNA Panel offered by [facility] includes sequencing and/or deletion/duplication analysis of the  following 84 genes:  AIP*, ALK, APC*, ATM*, AXIN2*, BAP1*, BARD1*, BLM*, BMPR1A*, BRCA1*, BRCA2*, BRIP1*, CASR, CDC73*, CDH1*, CDK4, CDKN1B*, CDKN1C*, CDKN2A, CEBPA, CHEK2*, CTNNA1*, DICER1*, DIS3L2*, EGFR, EPCAM, FH*, FLCN*, GATA2*, GPC3, GREM1, HOXB13, HRAS, KIT, MAX*, MEN1*, MET, MITF, MLH1*, MSH2*, MSH3*, MSH6*, MUTYH*, NBN*, NF1*, NF2*, NTHL1*, PALB2*, PDGFRA, PHOX2B, PMS2*, POLD1*, POLE*, POT1*, PRKAR1A*, PTCH1*, PTEN*, RAD50*, RAD51C*, RAD51D*, RB1*, RECQL4, RET, RUNX1*, SDHA*, SDHAF2*, SDHB*, SDHC*, SDHD*, SMAD4*, SMARCA4*, SMARCB1*, SMARCE1*, STK11*, SUFU*, TERC, TERT, TMEM127*, Tp53*, TSC1*, TSC2*, VHL*, WRN*, and WT1.  RNA analysis is performed for * genes.  The test report has been scanned into EPIC and is located under the Molecular Pathology section of the Results Review tab.  A portion of the result report is included below for reference.     We discussed with Ms. Nutting that because current genetic testing is not perfect, it is possible there may be a gene mutation in one of these genes that current testing cannot detect, but that chance is small.  We also discussed, that there could be another gene that has not yet been discovered, or that we have not yet tested, that is responsible for the cancer diagnoses in the family. It is also possible there is a hereditary cause for the cancer in the family that Ms. Hollenbaugh did not inherit and therefore was not identified in her testing.  Therefore, it is  important to remain in touch with cancer genetics in the future so that we can continue to offer Ms. Achey the most up to date genetic testing.   Genetic testing did identify a variant of uncertain significance (VUS) in the PMS2 gene called c.1510G>C ([id number].Glu504Gln).  At this time, it is unknown if this variant is associated with increased cancer risk or if this is a normal finding, but most variants such as this get reclassified to being inconsequential. It should not be used to make medical management  decisions. With time, we suspect the lab will determine the significance of this variant, if any. If we do learn more about it, we will try to contact Ms. Fulmore to discuss it further. However, it is important to stay in touch with [place] periodically and keep the address and phone number up to date.  ADDITIONAL GENETIC TESTING: We discussed with Ms. Glendenning that her genetic testing was fairly extensive.  If there are genes identified to increase cancer risk that can be analyzed in the future, we would be happy to discuss and coordinate this testing at that time.    CANCER SCREENING RECOMMENDATIONS: Ms. Zahn test result is considered negative (normal).  This means that we have not identified a hereditary cause for her family history of cancer at this time.   While reassuring, this does not definitively rule out a hereditary predisposition to cancer. It is still possible that there could be genetic mutations that are undetectable by current technology. There could be genetic mutations in genes that have not been tested or identified to increase cancer risk.  Therefore, it is recommended she continue to follow the cancer management and screening guidelines provided by her primary healthcare provider.   An individual's cancer risk and medical management are not determined by genetic test results alone. Overall cancer risk assessment incorporates additional factors, including personal medical history, family history, and any available genetic information that may result in a personalized plan for cancer prevention and surveillance  RECOMMENDATIONS FOR FAMILY MEMBERS:  Individuals in this family might be at some increased risk of developing cancer, over the general population risk, simply due to the family history of cancer.  We recommended women in this family have a yearly mammogram beginning at age 7, or 75 years younger than the earliest onset of cancer, an annual clinical breast exam, and perform monthly  breast self-exams. Women in this family should also have a gynecological exam as recommended by their primary provider. Family members should be referred for colonoscopy starting at age 50, or earlier, as recommended by their providers.   It is also possible there is a hereditary cause for the cancer in Ms. Vannest's family that she did not inherit and therefore was not identified in her.  Based on Ms. Shambley's family history of bilateral breast cancer in her deceased mother, we recommended Ms. Micciche's sister have genetic counseling and testing. Ms. Ratti will let us know if we can be of any assistance in coordinating genetic counseling and/or testing for this family member.   FOLLOW-UP: Lastly, we discussed with Ms. Coyne that cancer genetics is a rapidly advancing field and it is possible that new genetic tests will be appropriate for her and/or her family members in the future. We encouraged her to remain in contact with cancer genetics on an annual basis so we can update her personal and family histories and let her know of advances in cancer genetics that may benefit this family.   Our contact  number was provided. Ms. Sweigert questions were answered to her satisfaction, and she knows she is welcome to call us at anytime with additional questions or concerns.     Ayce Pietrzyk M. Joette Catching, [place], [facility] Genetic Counselor [email or web address] (P) [phone]

## 2021-08-20 ENCOUNTER — Other Ambulatory Visit: Payer: Self-pay | Admitting: Obstetrics and Gynecology

## 2021-08-20 ENCOUNTER — Encounter: Payer: Self-pay | Admitting: Family Medicine

## 2021-08-20 DIAGNOSIS — Z1231 Encounter for screening mammogram for malignant neoplasm of breast: Secondary | ICD-10-CM

## 2021-08-20 NOTE — Telephone Encounter (Signed)
Left voicemail for patient to return call.

## 2021-08-22 ENCOUNTER — Encounter: Payer: Self-pay | Admitting: Family Medicine

## 2021-08-22 ENCOUNTER — Ambulatory Visit (INDEPENDENT_AMBULATORY_CARE_PROVIDER_SITE_OTHER)
Admission: RE | Admit: 2021-08-22 | Discharge: 2021-08-22 | Disposition: A | Discharge status: Home / Self Care | Payer: Medicare Other | Source: Ambulatory Visit | Attending: Family Medicine | Admitting: Family Medicine

## 2021-08-22 ENCOUNTER — Ambulatory Visit (INDEPENDENT_AMBULATORY_CARE_PROVIDER_SITE_OTHER): Payer: Medicare Other | Admitting: Family Medicine

## 2021-08-22 ENCOUNTER — Other Ambulatory Visit: Payer: Self-pay

## 2021-08-22 VITALS — BP 106/70 | HR 65 | Temp 97.7°F | Ht 66.0 in | Wt 127.6 lb

## 2021-08-22 DIAGNOSIS — M25562 Pain in left knee: Secondary | ICD-10-CM

## 2021-08-22 MED ORDER — MELOXICAM 7.5 MG PO TABS: 7.5000 mg | ORAL_TABLET | Freq: Every day | ORAL | 0 refills | Status: DC

## 2021-08-22 NOTE — Progress Notes (Signed)
Subjective  CC:  Chief Complaint  Patient presents with   Knee Pain    Left leg and knee, ongoing for 3 weeks. Wants a PT referral    HPI: Sharon Harrell is a 70 y.o. female who presents to the office today to address the problems listed above in the chief complaint. 70 yo c/o 3 week history of left knee pain. Noted pain with stretching quads prior to her  daily walk, but progressed to daily discomfort and now pain with squatting or stairclimbing. No locking or giveway. No swelling. No h/o knee pain. No injury. "Tweaks" with pivoting moves while dancing. No calf pain or swelling. Advil helped.   Assessment  1. Acute pain of left knee      Plan  Pain knee:  medical lig strain vs meniscal degenerative changes vs OA vs other. Mild; conservative care with rest, nsaids and knee support. Recheck 2-3 weeks. Check xray.   Follow up: cpe  09/12/2021  Orders Placed This Encounter  Procedures   DG Knee Complete 4 Views Left   Meds ordered this encounter  Medications   meloxicam (MOBIC) 7.5 MG tablet    Sig: Take 1 tablet (7.5 mg total) by mouth daily.    Dispense:  30 tablet    Refill:  0      I reviewed the patients updated PMH, FH, and SocHx.    Patient Active Problem List   Diagnosis Date Noted   Acquired hypothyroidism 12/28/2019    Priority: 1.   Mixed hyperlipidemia 08/31/2019    Priority: 1.   Frequent PVCs 10/07/2018    Priority: 1.   Osteoporosis 07/16/2017    Priority: 1.   Esophageal dysphagia 07/01/2019    Priority: 2.   Gastroesophageal reflux disease 07/01/2019    Priority: 2.   Chronic idiopathic constipation 07/01/2019    Priority: 2.   Family history of breast cancer 02/21/2018    Priority: 2.   Laryngopharyngeal reflux (LPR) 07/16/2017    Priority: 2.   Primary osteoarthritis of right hand 04/20/2020    Priority: 3.   Allergic conjunctivitis 12/28/2019    Priority: 3.   Diverticulosis of colon 12/28/2019    Priority: 3.   Rosacea 12/28/2019     Priority: 3.   Chronic allergic rhinitis 02/21/2018    Priority: 3.   Hepatic cyst 02/21/2018    Priority: 3.   Nocturnal leg cramps 06/06/2014    Priority: 3.   Genetic testing 08/14/2021   Vitreous degeneration of both eyes 02/18/2019   Current Meds  Medication Sig   Calcium Carbonate (CALCIUM 600 PO) Take by mouth. 1200 mg   Cholecalciferol (VITAMIN D-3) 5000 UNITS TABS Take 5,000 Units by mouth daily.    [employer] MAGNESIUM CITRATE PO Take 250 mg by mouth daily.    Cyanocobalamin (B-12) 5000 MCG SUBL Place under the tongue. Taking twice per month.   EUTHYROX 75 MCG tablet Take 1 tablet by mouth once daily   famotidine (PEPCID) 20 MG tablet Take 20 mg by mouth at bedtime.    fluticasone (FLONASE) 50 MCG/ACT nasal spray Place 1 spray into both nostrils daily as needed for allergies.    meloxicam (MOBIC) 7.5 MG tablet Take 1 tablet (7.5 mg total) by mouth daily.   metoprolol succinate (TOPROL-XL) 25 MG 24 hr tablet Take 1 tablet by mouth once daily   metroNIDAZOLE (METROCREAM) 0.75 % cream Apply 1 application topically daily as needed (rosacea).    mometasone (ELOCON) 0.1 % cream  Apply 1 application topically daily as needed (itchy ears).    olopatadine (PATANOL) 0.1 % ophthalmic solution Place 1 drop into both eyes 2 (two) times daily as needed for allergies.     Allergies: Patient has No Known Allergies. Family History: Patient family history includes Breast cancer in her mother; Breast cancer (age of onset: 51) in her cousin; Cancer (age of onset: 40) in her cousin; Cancer (age of onset: 16) in her maternal uncle; Cervical cancer (age of onset: 60) in her mother; Colon polyps in her son; Dementia in her mother; Endometriosis in her sister; Heart disease in her brother; Lung cancer (age of onset: 66) in her maternal uncle; Osteoarthritis in her mother; Skin cancer (age of onset: 10) in her son; Stroke in her brother and maternal grandmother; Throat cancer (age of onset: 24) in her maternal  uncle. Social History:  Patient  reports that she quit smoking about 49 years ago. Her smoking use included cigarettes. She has a 2.00 pack-year smoking history. She has never used smokeless tobacco. She reports current alcohol use. She reports that she does not use drugs.  Review of Systems: Constitutional: Negative for fever malaise or anorexia Cardiovascular: negative for chest pain Respiratory: negative for SOB or persistent cough Gastrointestinal: negative for abdominal pain  Objective  Vitals: BP 106/70   Pulse 65   Temp 97.7 F (36.5 C) (Temporal)   Ht 5\' 6"  (1.676 m)   Wt 127 lb 9.6 oz (57.9 kg)   LMP 08/18/1982 (Approximate)   SpO2 98%   BMI 20.60 kg/m  General: no acute distress , A&Ox3 Left knee: nl appearing. No swelling. Medial CL,joint ttp, neg lachmans but ? Mild click and ttp with mcmurrays. No palpable bakers cyst. No crepitus, normal gait. Can squat.    Commons side effects, risks, benefits, and alternatives for medications and treatment plan prescribed today were discussed, and the patient expressed understanding of the given instructions. Patient is instructed to call or message via MyChart if he/she has any questions or concerns regarding our treatment plan. No barriers to understanding were identified. We discussed Red Flag symptoms and signs in detail. Patient expressed understanding regarding what to do in case of urgent or emergency type symptoms.  Medication list was reconciled, printed and provided to the patient in AVS. Patient instructions and summary information was reviewed with the patient as documented in the AVS. This note was prepared with assistance of Dragon voice recognition software. Occasional wrong-word or sound-a-like substitutions may have occurred due to the inherent limitations of voice recognition software  This visit occurred during the SARS-CoV-2 public health emergency.  Safety protocols were in place, including screening questions prior  to the visit, additional usage of staff PPE, and extensive cleaning of exam room while observing appropriate contact time as indicated for disinfecting solutions.

## 2021-08-22 NOTE — Patient Instructions (Signed)
Please follow up as scheduled for your next visit with me: 09/12/2021   Rest, ice and use mobic daily for 2 weeks. We will recheck at your next visit.   If you have any questions or concerns, please don't hesitate to send me a message via MyChart or call the office at [phone]. Thank you for visiting with [place] today! It's our pleasure caring for you.   Please go to our [facility] xray department to get your xrays done. You can walk in M-F between 8:30am- noon or 1pm - 5pm. Tell them you are there for xrays ordered by me. They will send me the results, then I will let you know the results with instructions.   Address: [address]  The Xray department is located in the basement.

## 2021-08-29 ENCOUNTER — Ambulatory Visit: Payer: Medicare Other | Admitting: Family Medicine

## 2021-09-11 ENCOUNTER — Other Ambulatory Visit: Payer: Self-pay | Admitting: Family Medicine

## 2021-09-12 ENCOUNTER — Other Ambulatory Visit: Payer: Self-pay

## 2021-09-12 ENCOUNTER — Ambulatory Visit (INDEPENDENT_AMBULATORY_CARE_PROVIDER_SITE_OTHER): Payer: Medicare Other | Admitting: Family Medicine

## 2021-09-12 ENCOUNTER — Encounter: Payer: Self-pay | Admitting: Family Medicine

## 2021-09-12 VITALS — BP 102/72 | HR 62 | Temp 98.1°F | Ht 66.0 in | Wt 128.6 lb

## 2021-09-12 DIAGNOSIS — Z9189 Other specified personal risk factors, not elsewhere classified: Secondary | ICD-10-CM

## 2021-09-12 DIAGNOSIS — K5904 Chronic idiopathic constipation: Secondary | ICD-10-CM

## 2021-09-12 DIAGNOSIS — M810 Age-related osteoporosis without current pathological fracture: Secondary | ICD-10-CM | POA: Diagnosis not present

## 2021-09-12 DIAGNOSIS — Z803 Family history of malignant neoplasm of breast: Secondary | ICD-10-CM | POA: Diagnosis not present

## 2021-09-12 DIAGNOSIS — K219 Gastro-esophageal reflux disease without esophagitis: Secondary | ICD-10-CM | POA: Diagnosis not present

## 2021-09-12 DIAGNOSIS — M25562 Pain in left knee: Secondary | ICD-10-CM

## 2021-09-12 DIAGNOSIS — E538 Deficiency of other specified B group vitamins: Secondary | ICD-10-CM | POA: Diagnosis not present

## 2021-09-12 DIAGNOSIS — E039 Hypothyroidism, unspecified: Secondary | ICD-10-CM | POA: Diagnosis not present

## 2021-09-12 DIAGNOSIS — I493 Ventricular premature depolarization: Secondary | ICD-10-CM | POA: Diagnosis not present

## 2021-09-12 DIAGNOSIS — E782 Mixed hyperlipidemia: Secondary | ICD-10-CM | POA: Diagnosis not present

## 2021-09-12 LAB — LIPID PANEL
Cholesterol: 210 mg/dL — ABNORMAL HIGH (ref 0–200)
HDL: 72.6 mg/dL (ref >39.00)
LDL Cholesterol: 116 mg/dL — ABNORMAL HIGH (ref 0–99)
NonHDL: 137.37
Total CHOL/HDL Ratio: 3
Triglycerides: 108 mg/dL (ref 0.0–149.0)
VLDL: 21.6 mg/dL (ref 0.0–40.0)

## 2021-09-12 LAB — CBC WITH DIFFERENTIAL/PLATELET
Basophils Absolute: 0 10*3/uL (ref 0.0–0.1)
Basophils Relative: 0.5 % (ref 0.0–3.0)
Eosinophils Absolute: 0.1 10*3/uL (ref 0.0–0.7)
Eosinophils Relative: 1.4 % (ref 0.0–5.0)
HCT: 41.3 % (ref 36.0–46.0)
Hemoglobin: 14.1 g/dL (ref 12.0–15.0)
Lymphocytes Relative: 22.5 % (ref 12.0–46.0)
Lymphs Abs: 1.2 10*3/uL (ref 0.7–4.0)
MCHC: 34.1 g/dL (ref 30.0–36.0)
MCV: 87.9 fl (ref 78.0–100.0)
Monocytes Absolute: 0.3 10*3/uL (ref 0.1–1.0)
Monocytes Relative: 6.5 % (ref 3.0–12.0)
Neutro Abs: 3.6 10*3/uL (ref 1.4–7.7)
Neutrophils Relative %: 69.1 % (ref 43.0–77.0)
Platelets: 220 10*3/uL (ref 150.0–400.0)
RBC: 4.7 Mil/uL (ref 3.87–5.11)
RDW: 13.1 % (ref 11.5–15.5)
WBC: 5.2 10*3/uL (ref 4.0–10.5)

## 2021-09-12 LAB — COMPREHENSIVE METABOLIC PANEL
ALT: 20 U/L (ref 0–35)
AST: 23 U/L (ref 0–37)
Albumin: 4.3 g/dL (ref 3.5–5.2)
Alkaline Phosphatase: 73 U/L (ref 39–117)
BUN: 14 mg/dL (ref 6–23)
CO2: 30 mEq/L (ref 19–32)
Calcium: 9.2 mg/dL (ref 8.4–10.5)
Chloride: 100 mEq/L (ref 96–112)
Creatinine, Ser: 0.89 mg/dL (ref 0.40–1.20)
GFR: 66.13 mL/min (ref >60.00)
Glucose, Bld: 93 mg/dL (ref 70–99)
Potassium: 4.1 mEq/L (ref 3.5–5.1)
Sodium: 135 mEq/L (ref 135–145)
Total Bilirubin: 0.7 mg/dL (ref 0.2–1.2)
Total Protein: 6.7 g/dL (ref 6.0–8.3)

## 2021-09-12 LAB — VITAMIN B12: Vitamin B-12: 739 pg/mL (ref 211–911)

## 2021-09-12 LAB — TSH: TSH: 1.62 u[IU]/mL (ref 0.35–5.50)

## 2021-09-12 MED ORDER — MELOXICAM 7.5 MG PO TABS: 7.5000 mg | ORAL_TABLET | Freq: Every day | ORAL | 0 refills | Status: DC | PRN

## 2021-09-12 NOTE — Patient Instructions (Addendum)
Please return in 12 months for your annual complete physical; please come fasting.   I will release your lab results to you on your MyChart account with further instructions. Please reply with any questions.    If you have any questions or concerns, please don't hesitate to send me a message via MyChart or call the office at [phone]. Thank you for visiting with [place] today! It's our pleasure caring for you.   Patellofemoral Pain Syndrome Patellofemoral pain syndrome is a condition in which the tissue (cartilage) on the underside of the kneecap (patella) softens or breaks down. This causes pain in the front of the knee. The condition is also called runner's knee or chondromalacia patella. Patellofemoral pain syndrome is most common in young adults who are active in sports. The knee is the largest joint in the body. The patella covers the front of the knee and is attached to muscles above and below the knee. The underside of the patella is covered with a smooth type of cartilage (synovium). The smooth surface helps the patella glide easily when you move your knee. Patellofemoral pain syndrome causes swelling in the joint linings and bone surfaces in the knee. What are the causes? This condition may be caused by: Overuse of the knee. Poor alignment of your knee joints. Weak leg muscles. A direct hit to your kneecap. What increases the risk? You are more likely to develop this condition if: You do a lot of activities that can wear down your kneecap. These include: Running. Squatting. Climbing stairs. You start a new physical activity or exercise program. You wear shoes that do not fit well. You do not have good leg strength. You are overweight. What are the signs or symptoms? The main symptom of this condition is knee pain. This may feel like a dull, aching pain underneath your patella, in the front of your knee. There may be a popping or cracking sound when you move your knee. Pain may get  worse with: Exercise. Climbing stairs. Running. Jumping. Squatting. Kneeling. Sitting for a long time. Moving or pushing on your patella. How is this diagnosed? This condition may be diagnosed based on: Your symptoms and medical history. You may be asked about your recent physical activities and which ones cause knee pain. A physical exam. This may include: Moving your patella back and forth. Checking your range of knee motion. Having you squat or jump to see if you have pain. Checking the strength of your leg muscles. Imaging tests to confirm the diagnosis. These may include an MRI of your knee. How is this treated? This condition may be treated at home with rest, ice, compression, and elevation (RICE).  Other treatments may include: NSAIDs, such as ibuprofen. Physical therapy to stretch and strengthen your leg muscles. Shoe inserts (orthotics) to take stress off your knee. A knee brace or knee support. Adhesive tapes to the skin. Surgery to remove damaged cartilage or move the patella to a better position. This is rare. Follow these instructions at home: If you have a brace: Wear the brace as told by your health care provider. Remove it only as told by your health care provider. Loosen the brace if your toes tingle, become numb, or turn cold and blue. Keep the brace clean. If the brace is not waterproof: Do not let it get wet. Cover it with a watertight covering when you take a bath or a shower. Managing pain, stiffness, and swelling  If directed, put ice on the painful area.  To do this: If you have a removable brace, remove it as told by your health care provider. Put ice in a plastic bag. Place a towel between your skin and the bag. Leave the ice on for 20 minutes, 2-3 times a day. Remove the ice if your skin turns bright red. This is very important. If you cannot feel pain, heat, or cold, you have a greater risk of damage to the area. Move your toes often to reduce  stiffness and swelling. Raise (elevate) the injured area above the level of your heart while you are sitting or lying down. Activity Rest your knee. Avoid activities that cause knee pain. Perform stretching and strengthening exercises as told by your health care provider or physical therapist. Return to your normal activities as told by your health care provider. Ask your health care provider what activities are safe for you. General instructions Take over-the-counter and prescription medicines only as told by your health care provider. Use splints, braces, knee supports, or walking aids as directed by your health care provider. Do not use any products that contain nicotine or tobacco, such as cigarettes, e-cigarettes, and chewing tobacco. These can delay healing. If you need help quitting, ask your health care provider. Keep all follow-up visits. This is important. Contact a health care provider if: Your symptoms get worse. You are not improving with home care. Summary Patellofemoral pain syndrome is a condition in which the tissue (cartilage) on the underside of the kneecap (patella) softens or breaks down. This condition causes swelling in the joint linings and bone surfaces in the knee. This leads to pain in the front of the knee. This condition may be treated at home with rest, ice, compression, and elevation (RICE). Use splints, braces, knee supports, or walking aids as directed by your health care provider. This information is not intended to replace advice given to you by your health care provider. Make sure you discuss any questions you have with your health care provider. Document Revised: 04/19/2020 Document Reviewed: 04/19/2020 Elsevier Patient Education  2022 [employer].

## 2021-09-12 NOTE — Progress Notes (Signed)
Subjective  Chief Complaint  Patient presents with   Annual Exam   Hyperlipidemia   Hypothyroidism    HPI: Sharon Harrell is a 70 y.o. female who presents to [facility] today for a Female Wellness Visit. She also has the concerns and/or needs as listed above in the chief complaint. These will be addressed in addition to the Health Maintenance Visit.   Wellness Visit: annual visit with health maintenance review and exam without Pap  Health maintenance: Screens are up-to-date.  Reviewed most recent bone density.  Fosamax was stopped this year.  She completed 5 years of treatment.  Declines flu vaccine.  Other immunizations up-to-date.  Declines COVID boosters.  Active and healthy lifestyle Chronic disease f/u and/or acute problem visit: (deemed necessary to be done in addition to the wellness visit): Reviewed multiple chronic medical problems including hyperlipidemia, PVCs, GERD, LPR, constipation, rosacea and hypothyroidism.  No new concerns.  Symptoms are well controlled on current medications.  See medication list.  Due for lab work.  She is fasting.  Discussed atherosclerotic risk score and indications for statins.  Patient is hesitant.  Elevated breast cancer risk with negative genetic testing recently.  Reviewed.  Would qualify for breast MRI imaging annually.  She will follow-up with GYN for this. Follow-up left knee pain: Remains about the same.  No worsening.  Worse after prolonged sitting.  No swelling.  Normal x-ray.  Using meloxicam. History of mild B12 deficiency on supplements.  Due for recheck.   Assessment  1. Acquired hypothyroidism   2. Mixed hyperlipidemia   3. Frequent PVCs   4. Age-related osteoporosis without current pathological fracture   5. Gastroesophageal reflux disease without esophagitis   6. Chronic idiopathic constipation   7. Vitamin B12 deficiency   8. Family history of breast cancer   9. At high risk for breast cancer   10.  Acute pain of left knee      Plan  Female Wellness Visit: Age appropriate Health Maintenance and Prevention measures were discussed with patient. Included topics are cancer screening recommendations, ways to keep healthy (see AVS) including dietary and exercise recommendations, regular eye and dental care, use of seat belts, and avoidance of moderate alcohol use and tobacco use.  BMI: discussed patient's BMI and encouraged positive lifestyle modifications to help get to or maintain a target BMI. HM needs and immunizations were addressed and ordered. See below for orders. See HM and immunization section for updates. Routine labs and screening tests ordered including cmp, cbc and lipids where appropriate. Discussed recommendations regarding Vit D and calcium supplementation (see AVS)  Chronic disease management visit and/or acute problem visit: Chronic medical problems are well controlled.  Recheck lipids, electrolytes, renal function and thyroid levels.  No changes in medications today. Knee pain: Start VMO exercises for possible PFS.  Meloxicam as needed.  Follow-up as needed.  Follow up: No follow-ups on file.  Orders Placed This Encounter  Procedures   CBC with Differential/Platelet   Comprehensive metabolic panel   Lipid panel   TSH   Vitamin B12   Meds ordered this encounter  Medications   meloxicam (MOBIC) 7.5 MG tablet    Sig: Take 1 tablet (7.5 mg total) by mouth daily as needed for pain.    Dispense:  30 tablet    Refill:  0      Body mass index is 20.76 kg/m. Wt Readings from Last 3 Encounters:  09/12/21 128 lb 9.6 oz (58.3  kg)  08/22/21 127 lb 9.6 oz (57.9 kg)  04/21/21 128 lb (58.1 kg)     Patient Active Problem List   Diagnosis Date Noted   Acquired hypothyroidism 12/28/2019    Priority: 1.   Mixed hyperlipidemia 08/31/2019    Priority: 1.   Frequent PVCs 10/07/2018    Priority: 1.    Normal cardiac cath and nl EF 08/2019, Dr. Martinique    Osteoporosis  07/16/2017    Priority: 1.    Dr. Talbert Nan: 2017: T = -2.5, started meds: f/u 2019 improved T = - 2.1 on fosamax. F/u dexa 2021: T - -2.2, stopped fosamax after years    Esophageal dysphagia 07/01/2019    Priority: 2.    Followed by  Dr. Collene Mares last seen 06/08/19 EGD ordered.  EGD preformed on [id number] impression: normal appearing widely patent esophagus and GEJ-Z line was measured at 40 cm. Mild antral gastritis. Normal examined duodenum. No specimens collected.     Gastroesophageal reflux disease 07/01/2019    Priority: 2.    Followed by  Dr. Collene Mares last seen 06/08/19    Chronic idiopathic constipation 07/01/2019    Priority: 2.    Followed by  Dr. Collene Mares last seen 06/08/19    Family history of breast cancer 02/21/2018    Priority: 2.    Lifetime risk is greater than 20%. She has history of a benign surgical excisional biopsy of the right breast in 1991.    Laryngopharyngeal reflux (LPR) 07/16/2017    Priority: 2.   Primary osteoarthritis of right hand 04/20/2020    Priority: 3.   Allergic conjunctivitis 12/28/2019    Priority: 3.   Diverticulosis of colon 12/28/2019    Priority: 3.    Multiple large mouth colonoscopy finding    Rosacea 12/28/2019    Priority: 3.   Chronic allergic rhinitis 02/21/2018    Priority: 3.   Hepatic cyst 02/21/2018    Priority: 3.   Nocturnal leg cramps 06/06/2014    Priority: 3.   At high risk for breast cancer 09/12/2021   Genetic testing 08/14/2021    Negative hereditary cancer genetic testing: no pathogenic variants detected in [facility] Multi-Cancer +RNA Panel. Variants of uncertain significance in PMS2 at c.1510G>C (p.Glu504Gln).  The report date is August 13, 2021.   The Multi-Cancer + RNA Panel offered by [facility] includes sequencing and/or deletion/duplication analysis of the following 84 genes:  AIP*, ALK, APC*, ATM*, AXIN2*, BAP1*, BARD1*, BLM*, BMPR1A*, BRCA1*, BRCA2*, BRIP1*, CASR, CDC73*, CDH1*, CDK4, CDKN1B*, CDKN1C*, CDKN2A, CEBPA,  CHEK2*, CTNNA1*, DICER1*, DIS3L2*, EGFR, EPCAM, FH*, FLCN*, GATA2*, GPC3, GREM1, HOXB13, HRAS, KIT, MAX*, MEN1*, MET, MITF, MLH1*, MSH2*, MSH3*, MSH6*, MUTYH*, NBN*, NF1*, NF2*, NTHL1*, PALB2*, PDGFRA, PHOX2B, PMS2*, POLD1*, POLE*, POT1*, PRKAR1A*, PTCH1*, PTEN*, RAD50*, RAD51C*, RAD51D*, RB1*, RECQL4, RET, RUNX1*, SDHA*, SDHAF2*, SDHB*, SDHC*, SDHD*, SMAD4*, SMARCA4*, SMARCB1*, SMARCE1*, STK11*, SUFU*, TERC, TERT, TMEM127*, Tp53*, TSC1*, TSC2*, VHL*, WRN*, and WT1.  RNA analysis is performed for * genes.     Vitreous degeneration of both eyes 02/18/2019   Health Maintenance  Topic Date Due   INFLUENZA VACCINE  02/15/2022 (Originally 06/18/2021)   MAMMOGRAM  09/29/2021   DEXA SCAN  08/16/2022   TETANUS/TDAP  02/24/2029   COLONOSCOPY (Pts 45-45yr Insurance coverage will need to be confirmed)  07/13/2029   Pneumonia Vaccine 70 Years old  Completed   Hepatitis C Screening  Completed   Zoster Vaccines- Shingrix  Completed   HPV VACCINES  Aged Out   COVID-19 Vaccine  Discontinued  Immunization History  Administered Date(s) Administered   Influenza, High Dose Seasonal PF 08/22/2017, 08/31/2018, 09/08/2019   Influenza-Unspecified 08/22/2017   PFIZER(Purple Top)SARS-COV-2 Vaccination 01/29/2020, 02/23/2020   Pneumococcal Conjugate-13 11/02/2018   Pneumococcal Polysaccharide-23 12/28/2019   Td 02/25/2019   Zoster Recombinat (Shingrix) 10/16/2020, 02/15/2021   Zoster, Live 04/18/2012   We updated and reviewed the patient's past history in detail and it is documented below. Allergies: Patient has No Known Allergies. Past Medical History Patient  has a past medical history of Cataract, DDD (degenerative disc disease), cervical (2006), Depression (1/06), Diverticulitis, Diverticulosis of colon (12/28/2019), Dysphonia, Frequent PVCs, Hyperlipidemia, Hypothyroidism, Nocturnal leg cramps (06/06/2014), Non-seasonal allergic rhinitis, Osteoporosis (2017), SUI (stress urinary incontinence, female)  (12/07), and Vitamin D deficiency disease (11/07). Past Surgical History Patient  has a past surgical history that includes Hand surgery (Left, 10/2009); Combined hysterectomy vaginal w/ MMK / A&P repair (0973); Tonsillectomy (as child); Wisdom tooth extraction; Liposuction; LEFT HEART CATH AND CORONARY ANGIOGRAPHY (N/A, 08/31/2019); and Breast excisional biopsy (Right, 1991). Family History: Patient family history includes Breast cancer in her mother; Breast cancer (age of onset: 2) in her cousin; Cancer (age of onset: 68) in her cousin; Cancer (age of onset: 70) in her maternal uncle; Cervical cancer (age of onset: 30) in her mother; Colon polyps in her son; Dementia in her mother; Endometriosis in her sister; Heart disease in her brother; Lung cancer (age of onset: 17) in her maternal uncle; Osteoarthritis in her mother; Skin cancer (age of onset: 79) in her son; Stroke in her brother and maternal grandmother; Throat cancer (age of onset: 20) in her maternal uncle. Social History:  Patient  reports that she quit smoking about 49 years ago. Her smoking use included cigarettes. She has a 2.00 pack-year smoking history. She has never used smokeless tobacco. She reports current alcohol use. She reports that she does not use drugs.  Review of Systems: Constitutional: negative for fever or malaise Ophthalmic: negative for photophobia, double vision or loss of vision Cardiovascular: negative for chest pain, dyspnea on exertion, or new LE swelling Respiratory: negative for SOB or persistent cough Gastrointestinal: negative for abdominal pain, change in bowel habits or melena Genitourinary: negative for dysuria or gross hematuria, no abnormal uterine bleeding or disharge Musculoskeletal: negative for new gait disturbance or muscular weakness Integumentary: negative for new or persistent rashes, no breast lumps Neurological: negative for TIA or stroke symptoms Psychiatric: negative for SI or  delusions Allergic/Immunologic: negative for hives  Patient Care Team    Relationship Specialty Notifications Start End  Leamon Arnt, MD PCP - General Family Medicine  12/28/19   Bo Merino, MD Consulting Physician Rheumatology  06/06/14   [facility] [place] Physician Otolaryngology  07/16/17   Merrilee Seashore, MD Consulting Physician Internal Medicine  08/19/17   Rolla Etienne (Inactive) Physician Assistant Hematology and Oncology  09/25/17   Juanita Craver, MD Consulting Physician Gastroenterology  07/01/19   Evans Lance, MD Consulting Physician Cardiology  12/28/19   Dorothy Spark, MD (Inactive) Consulting Physician Cardiology  12/28/19   Salvadore Dom, MD Consulting Physician Obstetrics and Gynecology  12/28/19   Sydnee Levans, MD Consulting Physician Dermatology  12/28/19   Kathrynn Ducking, MD Consulting Physician Neurology  12/28/19     Objective  Vitals: BP 102/72   Pulse 62   Temp 98.1 F (36.7 C) (Temporal)   Ht '5\' 6"'  (1.676 m)   Wt 128 lb 9.6 oz (58.3 kg)  LMP 08/18/1982 (Approximate)   SpO2 97%   BMI 20.76 kg/m  General:  Well developed, well nourished, no acute distress  Psych:  Alert and orientedx3,normal mood and affect HEENT:  Normocephalic, atraumatic, non-icteric sclera,  supple neck without adenopathy, mass or thyromegaly Cardiovascular:  Normal S1, S2, RRR without gallop, rub or murmur Respiratory:  Good breath sounds bilaterally, CTAB with normal respiratory effort Gastrointestinal: normal bowel sounds, soft, non-tender, no noted masses. No HSM MSK: no deformities, contusions. Joints are without erythema or swelling.  Skin:  Warm, no rashes or suspicious lesions noted Neurologic:    Mental status is normal. CN 2-11 are normal. Gross motor and sensory exams are normal. Normal gait. No tremor   Commons side effects, risks, benefits, and alternatives for medications and treatment plan prescribed  today were discussed, and the patient expressed understanding of the given instructions. Patient is instructed to call or message via MyChart if he/she has any questions or concerns regarding our treatment plan. No barriers to understanding were identified. We discussed Red Flag symptoms and signs in detail. Patient expressed understanding regarding what to do in case of urgent or emergency type symptoms.  Medication list was reconciled, printed and provided to the patient in AVS. Patient instructions and summary information was reviewed with the patient as documented in the AVS. This note was prepared with assistance of Dragon voice recognition software. Occasional wrong-word or sound-a-like substitutions may have occurred due to the inherent limitations of voice recognition software  This visit occurred during the SARS-CoV-2 public health emergency.  Safety protocols were in place, including screening questions prior to the visit, additional usage of staff PPE, and extensive cleaning of exam room while observing appropriate contact time as indicated for disinfecting solutions.

## 2021-09-13 ENCOUNTER — Encounter: Payer: Self-pay | Admitting: Family Medicine

## 2021-09-13 ENCOUNTER — Other Ambulatory Visit: Payer: Self-pay | Admitting: Family Medicine

## 2021-09-15 ENCOUNTER — Other Ambulatory Visit: Payer: Self-pay | Admitting: Family Medicine

## 2021-09-18 ENCOUNTER — Encounter: Payer: Self-pay | Admitting: Family Medicine

## 2021-09-19 ENCOUNTER — Other Ambulatory Visit: Payer: Self-pay

## 2021-09-19 MED ORDER — MELOXICAM 7.5 MG PO TABS: 7.5000 mg | ORAL_TABLET | Freq: Every day | ORAL | 0 refills | Status: DC | PRN

## 2021-10-01 ENCOUNTER — Ambulatory Visit (INDEPENDENT_AMBULATORY_CARE_PROVIDER_SITE_OTHER): Payer: Medicare Other | Admitting: Orthopaedic Surgery

## 2021-10-01 VITALS — Ht 66.0 in | Wt 128.0 lb

## 2021-10-01 DIAGNOSIS — G8929 Other chronic pain: Secondary | ICD-10-CM | POA: Diagnosis not present

## 2021-10-01 DIAGNOSIS — M25562 Pain in left knee: Secondary | ICD-10-CM | POA: Diagnosis not present

## 2021-10-01 MED ORDER — LIDOCAINE HCL 1 % IJ SOLN
3.0000 mL | INTRAMUSCULAR | Status: AC | PRN
  Administered 2021-10-01: 3 mL

## 2021-10-01 MED ORDER — METHYLPREDNISOLONE ACETATE 40 MG/ML IJ SUSP
40.0000 mg | INTRAMUSCULAR | Status: AC | PRN
  Administered 2021-10-01: 40 mg via INTRA_ARTICULAR

## 2021-10-01 NOTE — Progress Notes (Signed)
Office Visit Note   Patient: Sharon Harrell           Date of Birth: 1951-10-17           MRN: [id number] Visit Date: 10/01/2021              Requested by: Leamon Arnt, [address],  [place] [address] PCP: Leamon Arnt, MD   Assessment & Plan: Visit Diagnoses:  1. Acute pain of left knee     Plan: I talked her in length about trying a steroid injection for her left knee since this has been more of acute pain and given the fact that her x-rays show well-maintained joint space.  I want her to avoid walking up and down hills with her exercise walk routine and she can continue the knee sleeve if it is comfortable for her.  I explained the risk and benefits of steroid injections and placement of the left knee without difficulty.  We can reevaluate her in 3 weeks to see how she is doing overall.  She is a patient that I would consider a MRI of her left knee if she does not improve.  Follow-Up Instructions: Return in about 3 weeks (around 10/22/2021).   Orders:  Orders Placed This Encounter  Procedures   Large Joint Inj   No orders of the defined types were placed in this encounter.     Procedures: Large Joint Inj: L knee on 10/01/2021 2:20 PM Indications: diagnostic evaluation and pain Details: 22 G 1.5 in needle, superolateral approach  Arthrogram: No  Medications: 3 mL lidocaine 1 %; 40 mg methylPREDNISolone acetate 40 MG/ML Outcome: tolerated well, no immediate complications Procedure, treatment alternatives, risks and benefits explained, specific risks discussed. Consent was given by the patient. Immediately prior to procedure a time out was called to verify the correct patient, procedure, equipment, support staff and site/side marked as required. Patient was prepped and draped in the usual sterile fashion.      Clinical Data: No additional findings.   Subjective: Chief Complaint  Patient presents with   Left Knee - Pain  The patient is  someone I am seeing for the first time.  She is 70 years old and has been having left knee pain for only a month or 2 now.  She denies any injury but hurts when she is walking up and down stairs and up and down hills.  She is an avid walker.  She has tried a knee sleeve.  She is never had any type of surgery on that knee and never had any type of injection.  She is unaware of any specific injury.  She points the medial aspect of her knee and posterior as a source of her pain.  She denies any swelling.  She is not a diabetic.  She has no acute active medical issues.  She does get bilateral leg cramping which is chronic.  HPI  Review of Systems There is currently listed no headache, chest pain, shortness of breath, fever, chills, nausea, vomiting  Objective: Vital Signs: Ht 5\' 6"  (1.676 m)   Wt 128 lb (58.1 kg)   LMP 08/18/1982 (Approximate)   BMI 20.66 kg/m   Physical Exam She is alert and orient x3 and in no acute distress Ortho Exam Examination of her left knee does show some medial joint line tenderness and she is irritated with rotating the tibia on the femur on the medial compartment of her knee suggesting  a McMurray's exam.  There is no effusion.  The range of motion is full.  The knee is ligamentously stable. Specialty Comments:  No specialty comments available.  Imaging: No results found. X-rays independently reviewed on the canopy system of her left knee are entirely normal.  The joint space is very well-maintained.  PMFS History: Patient Active Problem List   Diagnosis Date Noted   At high risk for breast cancer 09/12/2021   Genetic testing 08/14/2021   Primary osteoarthritis of right hand 04/20/2020   Acquired hypothyroidism 12/28/2019   Allergic conjunctivitis 12/28/2019   Diverticulosis of colon 12/28/2019   Rosacea 12/28/2019   Mixed hyperlipidemia 08/31/2019   Esophageal dysphagia 07/01/2019   Gastroesophageal reflux disease 07/01/2019   Chronic idiopathic  constipation 07/01/2019   Vitreous degeneration of both eyes 02/18/2019   Frequent PVCs 10/07/2018   Chronic allergic rhinitis 02/21/2018   Family history of breast cancer 02/21/2018   Hepatic cyst 02/21/2018   Laryngopharyngeal reflux (LPR) 07/16/2017   Osteoporosis 07/16/2017   Nocturnal leg cramps 06/06/2014   Past Medical History:  Diagnosis Date   Cataract    DDD (degenerative disc disease), cervical 2006   Depression 1/06   resolved   Diverticulitis    Diverticulosis of colon 12/28/2019   Multiple large mouth colonoscopy finding   Dysphonia    Frequent PVCs    Hyperlipidemia    Hypothyroidism    Nocturnal leg cramps 06/06/2014   Non-seasonal allergic rhinitis    Osteoporosis 2017   SUI (stress urinary incontinence, female) 12/07   Vitamin D deficiency disease 11/07    Family History  Problem Relation Age of Onset   Cervical cancer Mother 51   Osteoarthritis Mother    Dementia Mother    Breast cancer Mother        dx early 37s; bilateral   Endometriosis Sister    Heart disease Brother    Stroke Brother    Throat cancer Maternal Uncle 55   Cancer Maternal Uncle 47       unknown type; ? pancreatic   Lung cancer Maternal Uncle 79   Stroke Maternal Grandmother    Cancer Cousin 37       tonsil; maternal female cousin   Breast cancer Cousin 21       DCIS; maternal female cousin   Colon polyps Son        one polyp; before age 9   Skin cancer Son 33       non-melanoma; shoulder    Past Surgical History:  Procedure Laterality Date   BREAST EXCISIONAL BIOPSY Right 1991   benign   COMBINED HYSTERECTOMY VAGINAL W/ MMK / A&P [place]   HAND SURGERY Left 10/2009   S-L    LEFT HEART CATH AND CORONARY ANGIOGRAPHY N/A 08/31/2019   Procedure: LEFT HEART CATH AND CORONARY ANGIOGRAPHY;  Surgeon: Martinique, Peter M, MD;  Location: [facility];  Service: Cardiovascular;  Laterality: N/A;   LIPOSUCTION     TONSILLECTOMY  as child   WISDOM TOOTH EXTRACTION      Social History   Occupational History   Occupation: Retired     Fish farm manager: [employer]  Tobacco Use   Smoking status: Former    Packs/day: 0.25    Years: 8.00    Pack years: 2.00    Types: Cigarettes    Quit date: 11/19/1971    Years since quitting: 49.9   Smokeless tobacco: Never  Vaping Use   Vaping Use:  Never used  Substance and Sexual Activity   Alcohol use: Yes    Comment: socially   Drug use: No   Sexual activity: Not Currently    Partners: Male    Birth control/protection: Surgical, Post-menopausal    Comment: hysterectomy

## 2021-10-08 DIAGNOSIS — Z1231 Encounter for screening mammogram for malignant neoplasm of breast: Secondary | ICD-10-CM

## 2021-10-22 ENCOUNTER — Ambulatory Visit: Payer: Medicare Other | Admitting: Orthopaedic Surgery

## 2021-10-30 DIAGNOSIS — L570 Actinic keratosis: Secondary | ICD-10-CM | POA: Diagnosis not present

## 2021-10-30 DIAGNOSIS — L814 Other melanin hyperpigmentation: Secondary | ICD-10-CM | POA: Diagnosis not present

## 2021-10-30 DIAGNOSIS — D1801 Hemangioma of skin and subcutaneous tissue: Secondary | ICD-10-CM | POA: Diagnosis not present

## 2021-10-30 DIAGNOSIS — L821 Other seborrheic keratosis: Secondary | ICD-10-CM | POA: Diagnosis not present

## 2021-10-30 DIAGNOSIS — D692 Other nonthrombocytopenic purpura: Secondary | ICD-10-CM | POA: Diagnosis not present

## 2021-10-30 DIAGNOSIS — D225 Melanocytic nevi of trunk: Secondary | ICD-10-CM | POA: Diagnosis not present

## 2021-10-30 DIAGNOSIS — D2262 Melanocytic nevi of left upper limb, including shoulder: Secondary | ICD-10-CM | POA: Diagnosis not present

## 2021-10-30 DIAGNOSIS — B070 Plantar wart: Secondary | ICD-10-CM | POA: Diagnosis not present

## 2021-11-09 DIAGNOSIS — S86312A Strain of muscle(s) and tendon(s) of peroneal muscle group at lower leg level, left leg, initial encounter: Secondary | ICD-10-CM | POA: Diagnosis not present

## 2021-11-09 DIAGNOSIS — S93402A Sprain of unspecified ligament of left ankle, initial encounter: Secondary | ICD-10-CM | POA: Diagnosis not present

## 2021-11-09 DIAGNOSIS — M25572 Pain in left ankle and joints of left foot: Secondary | ICD-10-CM | POA: Diagnosis not present

## 2021-11-27 ENCOUNTER — Other Ambulatory Visit: Payer: Self-pay | Admitting: Family Medicine

## 2021-11-28 ENCOUNTER — Other Ambulatory Visit: Payer: Self-pay

## 2021-11-28 MED ORDER — METOPROLOL SUCCINATE ER 25 MG PO TB24: 25.0000 mg | ORAL_TABLET | Freq: Every day | ORAL | 0 refills | Status: DC

## 2021-11-30 ENCOUNTER — Ambulatory Visit: Payer: Medicare Other

## 2021-12-06 ENCOUNTER — Ambulatory Visit: Payer: Medicare Other

## 2021-12-06 ENCOUNTER — Ambulatory Visit (INDEPENDENT_AMBULATORY_CARE_PROVIDER_SITE_OTHER): Payer: Medicare Other | Admitting: Podiatry

## 2021-12-06 ENCOUNTER — Ambulatory Visit
Admission: RE | Admit: 2021-12-06 | Discharge: 2021-12-06 | Disposition: A | Discharge status: Home / Self Care | Payer: Medicare Other | Source: Ambulatory Visit | Attending: Obstetrics and Gynecology | Admitting: Obstetrics and Gynecology

## 2021-12-06 ENCOUNTER — Encounter: Payer: Self-pay | Admitting: Podiatry

## 2021-12-06 ENCOUNTER — Other Ambulatory Visit: Payer: Self-pay

## 2021-12-06 DIAGNOSIS — K5732 Diverticulitis of large intestine without perforation or abscess without bleeding: Secondary | ICD-10-CM | POA: Insufficient documentation

## 2021-12-06 DIAGNOSIS — K625 Hemorrhage of anus and rectum: Secondary | ICD-10-CM | POA: Insufficient documentation

## 2021-12-06 DIAGNOSIS — R142 Eructation: Secondary | ICD-10-CM | POA: Insufficient documentation

## 2021-12-06 DIAGNOSIS — S9002XA Contusion of left ankle, initial encounter: Secondary | ICD-10-CM

## 2021-12-06 DIAGNOSIS — K5900 Constipation, unspecified: Secondary | ICD-10-CM | POA: Insufficient documentation

## 2021-12-06 DIAGNOSIS — Z1231 Encounter for screening mammogram for malignant neoplasm of breast: Secondary | ICD-10-CM | POA: Diagnosis not present

## 2021-12-06 DIAGNOSIS — R141 Gas pain: Secondary | ICD-10-CM | POA: Insufficient documentation

## 2021-12-06 DIAGNOSIS — R112 Nausea with vomiting, unspecified: Secondary | ICD-10-CM | POA: Insufficient documentation

## 2021-12-06 DIAGNOSIS — S93402A Sprain of unspecified ligament of left ankle, initial encounter: Secondary | ICD-10-CM | POA: Diagnosis not present

## 2021-12-06 MED ORDER — MELOXICAM 7.5 MG PO TABS: 7.5000 mg | ORAL_TABLET | Freq: Every day | ORAL | 3 refills | Status: DC

## 2021-12-06 NOTE — Progress Notes (Signed)
Subjective:  Patient ID: Sharon Harrell, female    DOB: Jul 27, 1951,  MRN: [id number] HPI Chief Complaint  Patient presents with   Foot Pain    Lateral ankle left - Dec 23rd - turned around and felt a pop, went to [facility]-xrayed - no fracture, suspected tendon injury, is currently in a walking boot, has improved, but wanted to follow up here, Was Rx'd Meloxicam, but only took for few days, iced ankle initially, also she is having cramps all throughout her body, but has been checked by Rheumatologist (brought xrays)    New Patient (Initial Visit)    71 y.o. female presents with the above complaint.   ROS: Denies fever chills nausea vomiting muscle aches pains calf pain back pain chest pain shortness of breath.  Past Medical History:  Diagnosis Date   Cataract    DDD (degenerative disc disease), cervical 2006   Depression 1/06   resolved   Diverticulitis    Diverticulosis of colon 12/28/2019   Multiple large mouth colonoscopy finding   Dysphonia    Frequent PVCs    Hyperlipidemia    Hypothyroidism    Nocturnal leg cramps 06/06/2014   Non-seasonal allergic rhinitis    Osteoporosis 2017   SUI (stress urinary incontinence, female) 12/07   Vitamin D deficiency disease 11/07   Past Surgical History:  Procedure Laterality Date   BREAST EXCISIONAL BIOPSY Right 1991   benign   COMBINED HYSTERECTOMY VAGINAL W/ MMK / A&P REPAIR  1983   HAND SURGERY Left 10/2009   S-L    LEFT HEART CATH AND CORONARY ANGIOGRAPHY N/A 08/31/2019   Procedure: LEFT HEART CATH AND CORONARY ANGIOGRAPHY;  Surgeon: Martinique, Peter M, MD;  Location: [place] [facility];  Service: Cardiovascular;  Laterality: N/A;   LIPOSUCTION     TONSILLECTOMY  as child   WISDOM TOOTH EXTRACTION      Current Outpatient Medications:    meloxicam (MOBIC) 7.5 MG tablet, Take 1 tablet (7.5 mg total) by mouth daily., Disp: 90 tablet, Rfl: 3   Calcium Carbonate (CALCIUM 600 PO), Take by mouth. 1200 mg, Disp: , Rfl:     Cholecalciferol (VITAMIN D-3) 5000 UNITS TABS, Take 5,000 Units by mouth daily. , Disp: , Rfl:    CVS MAGNESIUM CITRATE PO, Take 250 mg by mouth daily. , Disp: , Rfl:    Cyanocobalamin (B-12) 5000 MCG SUBL, Place under the tongue. Taking twice per month., Disp: , Rfl:    famotidine (PEPCID) 20 MG tablet, Take 20 mg by mouth at bedtime. , Disp: , Rfl:    fluticasone (FLONASE) 50 MCG/ACT nasal spray, Place 1 spray into both nostrils daily as needed for allergies. , Disp: , Rfl:    levothyroxine (SYNTHROID) 75 MCG tablet, Take 1 tablet by mouth once daily, Disp: 90 tablet, Rfl: 0   metoprolol succinate (TOPROL-XL) 25 MG 24 hr tablet, Take 1 tablet (25 mg total) by mouth daily. Please make overdue appt with Dr. Lovena Le before anymore refills. Thank you 1st attempt, Disp: 30 tablet, Rfl: 0   metroNIDAZOLE (METROCREAM) 0.75 % cream, Apply 1 application topically daily as needed (rosacea). , Disp: , Rfl:    mometasone (ELOCON) 0.1 % cream, Apply 1 application topically daily as needed (itchy ears). , Disp: , Rfl:    Multiple Vitamins-Minerals (HAIR/SKIN/NAILS) CAPS, Take by mouth., Disp: , Rfl:    olopatadine (PATANOL) 0.1 % ophthalmic solution, Place 1 drop into both eyes 2 (two) times daily as needed for allergies. , Disp: ,  Rfl:   No Known Allergies Review of Systems Objective:  There were no vitals filed for this visit.  General: Well developed, nourished, in no acute distress, alert and oriented x3   Dermatological: Skin is warm, dry and supple bilateral. Nails x 10 are well maintained; remaining integument appears unremarkable at this time. There are no open sores, no preulcerative lesions, no rash or signs of infection present.  Vascular: Dorsalis Pedis artery and Posterior Tibial artery pedal pulses are 2/4 bilateral with immedate capillary fill time. Pedal hair growth present. No varicosities and no lower extremity edema present bilateral.   Neruologic: Grossly intact via light touch  bilateral. Vibratory intact via tuning fork bilateral. Protective threshold with Semmes Wienstein monofilament intact to all pedal sites bilateral. Patellar and Achilles deep tendon reflexes 2+ bilateral. No Babinski or clonus noted bilateral.   Musculoskeletal: No gross boney pedal deformities bilateral. No pain, crepitus, or limitation noted with foot and ankle range of motion bilateral. Muscular strength 5/5 in all groups tested bilateral.  She has tenderness on palpation of the posterior calcaneofibular ligament no subluxation of the peroneals no pain on palpation of the peroneal tendons anterior talofibular ligament or calcaneofibular ligament.  There is some fluctuance in the tendon sheath of the peroneal inframalleolar region.  Gait: Unassisted, Nonantalgic.    Radiographs:  Reviewed radiographs taken at urgent care orthopedics demonstrates normal ankle mortise no acute findings no fractures no dislocations.  Assessment & Plan:   Assessment: Probable sprain of the posterior talofibular ligament.  Plan: Placed her in a Tri-Lock brace discussed meloxicam with her and she will continue that.  I recommended shoes at all times we will follow-up with her in 6 weeks if necessary.  If no improvement or regression then we will request MRI.     Sharon Harrell, [place]

## 2021-12-08 ENCOUNTER — Other Ambulatory Visit: Payer: Self-pay | Admitting: Family Medicine

## 2021-12-08 ENCOUNTER — Other Ambulatory Visit: Payer: Self-pay | Admitting: Internal Medicine

## 2022-01-11 DIAGNOSIS — Z20822 Contact with and (suspected) exposure to covid-19: Secondary | ICD-10-CM | POA: Diagnosis not present

## 2022-01-22 ENCOUNTER — Ambulatory Visit: Payer: Medicare Other | Admitting: Podiatry

## 2022-02-04 DIAGNOSIS — Z20822 Contact with and (suspected) exposure to covid-19: Secondary | ICD-10-CM | POA: Diagnosis not present

## 2022-02-14 ENCOUNTER — Ambulatory Visit: Payer: Medicare Other

## 2022-02-27 ENCOUNTER — Other Ambulatory Visit: Payer: Self-pay | Admitting: Family Medicine

## 2022-03-15 DIAGNOSIS — Z20822 Contact with and (suspected) exposure to covid-19: Secondary | ICD-10-CM | POA: Diagnosis not present

## 2022-04-29 ENCOUNTER — Ambulatory Visit (INDEPENDENT_AMBULATORY_CARE_PROVIDER_SITE_OTHER): Payer: Medicare Other

## 2022-04-29 ENCOUNTER — Ambulatory Visit
Admission: EM | Admit: 2022-04-29 | Discharge: 2022-04-29 | Disposition: A | Discharge status: Home / Self Care | Payer: Medicare Other | Attending: Family Medicine | Admitting: Family Medicine

## 2022-04-29 ENCOUNTER — Telehealth: Payer: Self-pay | Admitting: Family Medicine

## 2022-04-29 ENCOUNTER — Encounter: Payer: Self-pay | Admitting: Podiatry

## 2022-04-29 DIAGNOSIS — S92425A Nondisplaced fracture of distal phalanx of left great toe, initial encounter for closed fracture: Secondary | ICD-10-CM

## 2022-04-29 DIAGNOSIS — M7989 Other specified soft tissue disorders: Secondary | ICD-10-CM | POA: Diagnosis not present

## 2022-04-29 DIAGNOSIS — M25532 Pain in left wrist: Secondary | ICD-10-CM | POA: Diagnosis not present

## 2022-04-29 NOTE — Discharge Instructions (Signed)
Wear the postop shoe at all times for walking.  Ice, elevation, over-the-counter pain relievers as needed.  Follow-up with [facility] for recheck as soon as possible

## 2022-04-29 NOTE — Telephone Encounter (Signed)
Copied from [place] [id number]. Topic: Medicare AWV >> Apr 29, 2022 11:27 AM Devoria Glassing wrote: Reason for CRM: Left message for patient to schedule Annual Wellness Visit.  Please schedule with Nurse Health Advisor Charlott Rakes, RN at [facility].  Please call [phone] ask for [facility]

## 2022-04-29 NOTE — ED Triage Notes (Signed)
Pt presents with lift foot injury from dropping cinderblock on foot about an hour ago

## 2022-04-30 ENCOUNTER — Telehealth: Payer: Self-pay | Admitting: Podiatry

## 2022-04-30 NOTE — Telephone Encounter (Signed)
Pt scheduled to see Dr Paulla Dolly 6.14.2023.

## 2022-05-01 ENCOUNTER — Ambulatory Visit (INDEPENDENT_AMBULATORY_CARE_PROVIDER_SITE_OTHER): Payer: Medicare Other | Admitting: Podiatry

## 2022-05-01 DIAGNOSIS — S92425A Nondisplaced fracture of distal phalanx of left great toe, initial encounter for closed fracture: Secondary | ICD-10-CM | POA: Diagnosis not present

## 2022-05-01 NOTE — Progress Notes (Signed)
Subjective:   Patient ID: Sharon Harrell, female   DOB: 71 y.o.   MRN: [id number]   HPI Patient states she dropped something on the big toe left and they said she broke her big toe which she wanted it checked   ROS      Objective:  Physical Exam  Neurovascular status intact muscle strength adequate range of motion adequate traumatized left hallux approximate 3-day duration with redness in the proximal nail fold with no damage to the distal portion of the nailbed.  Also presents with x-rays     Assessment:  Fracture of the distal phalanx left but not through the joint with possible nail trauma     Plan:  H&P reviewed x-rays discussed trauma advised on soaks discussed she may lose her nail eventually but at this point it can just be watched and hopefully will be.  Patient to be seen back

## 2022-05-03 NOTE — ED Provider Notes (Signed)
[facility]    CSN: [id number] Arrival date & time: 04/29/22  1931      History   Chief Complaint Chief Complaint  Patient presents with   Foot Injury    HPI Sharon Harrell is a 71 y.o. female.   Presenting today with injury to the left foot distal great toe after dropping a cinder block onto the foot about an hour ago.  States the area is swollen, painful, bruising and she is having difficulty moving the area.  Denies bleeding, numbness, tingling, radiation of pain.  Trying over-the-counter pain relievers with minimal relief.   Past Medical History:  Diagnosis Date   Cataract    DDD (degenerative disc disease), cervical 2006   Depression 1/06   resolved   Diverticulitis    Diverticulosis of colon 12/28/2019   Multiple large mouth colonoscopy finding   Dysphonia    Frequent PVCs    Hyperlipidemia    Hypothyroidism    Nocturnal leg cramps 06/06/2014   Non-seasonal allergic rhinitis    Osteoporosis 2017   SUI (stress urinary incontinence, female) 12/07   Vitamin D deficiency disease 11/07    Patient Active Problem List   Diagnosis Date Noted   Constipation 12/06/2021   Diverticulitis of colon 12/06/2021   Flatulence, eructation and gas pain 12/06/2021   Nausea and vomiting 12/06/2021   Rectal bleeding 12/06/2021   At high risk for breast cancer 09/12/2021   Genetic testing 08/14/2021   Dense breasts 06/29/2021   Discharge from right nipple 06/28/2021   Acute UTI 04/17/2021   Urgency incontinence 04/17/2021   Primary osteoarthritis of right hand 04/20/2020   Acquired hypothyroidism 12/28/2019   Allergic conjunctivitis 12/28/2019   Diverticulosis of colon 12/28/2019   Rosacea 12/28/2019   Mixed hyperlipidemia 08/31/2019   Esophageal dysphagia 07/01/2019   Gastroesophageal reflux disease 07/01/2019   Chronic idiopathic constipation 07/01/2019   Vitreous degeneration of both eyes 02/18/2019   Frequent PVCs 10/07/2018   Chronic allergic rhinitis  02/21/2018   Family history of breast cancer 02/21/2018   Hepatic cyst 02/21/2018   Laryngopharyngeal reflux (LPR) 07/16/2017   Osteoporosis 07/16/2017   Nocturnal leg cramps 06/06/2014    Past Surgical History:  Procedure Laterality Date   BREAST EXCISIONAL BIOPSY Right 1991   benign   COMBINED HYSTERECTOMY VAGINAL W/ MMK / A&P REPAIR  1983   HAND SURGERY Left 10/2009   S-L    LEFT HEART CATH AND CORONARY ANGIOGRAPHY N/A 08/31/2019   Procedure: LEFT HEART CATH AND CORONARY ANGIOGRAPHY;  Surgeon: Martinique, Peter M, MD;  Location: [place] [facility];  Service: Cardiovascular;  Laterality: N/A;   LIPOSUCTION     TONSILLECTOMY  as child   WISDOM TOOTH EXTRACTION      OB History     Gravida  2   Para  2   Term  2   Preterm  0   AB  0   Living  2      SAB  0   IAB  0   Ectopic  0   Multiple  0   Live Births  2            Home Medications    Prior to Admission medications   Medication Sig Start Date End Date Taking? Authorizing Provider  Calcium Carbonate (CALCIUM 600 PO) Take by mouth. 1200 mg    [provider]  Cholecalciferol (VITAMIN D-3) 5000 UNITS TABS Take 5,000 Units by mouth daily.  [provider]  CVS MAGNESIUM CITRATE PO Take 250 mg by mouth daily.     [provider]  Cyanocobalamin (B-12) 5000 MCG SUBL Place under the tongue. Taking twice per month.    [provider]  famotidine (PEPCID) 20 MG tablet Take 20 mg by mouth at bedtime.     [provider]  fluticasone (FLONASE) 50 MCG/ACT nasal spray Place 1 spray into both nostrils daily as needed for allergies.     [provider]  levothyroxine (SYNTHROID) 75 MCG tablet Take 1 tablet by mouth once daily 02/27/22   Leamon Arnt, MD  meloxicam (MOBIC) 7.5 MG tablet Take 1 tablet (7.5 mg total) by mouth daily. 12/06/21   Hyatt, Max T, DPM  metoprolol succinate (TOPROL-XL) 25 MG 24 hr tablet Take 1 tablet (25 mg total) by mouth daily.  12/10/21   Evans Lance, MD  metroNIDAZOLE (METROCREAM) 0.75 % cream Apply 1 application topically daily as needed (rosacea).     [provider]  mometasone (ELOCON) 0.1 % cream Apply 1 application topically daily as needed (itchy ears).     [provider]  Multiple Vitamins-Minerals (HAIR/SKIN/NAILS) CAPS Take by mouth.    [provider]  olopatadine (PATANOL) 0.1 % ophthalmic solution Place 1 drop into both eyes 2 (two) times daily as needed for allergies.  09/16/16   [provider]    Family History Family History  Problem Relation Age of Onset   Cervical cancer Mother 44   Osteoarthritis Mother    Dementia Mother    Breast cancer Mother        dx early 1s; bilateral   Endometriosis Sister    Heart disease Brother    Stroke Brother    Throat cancer Maternal Uncle 55   Cancer Maternal Uncle 69       unknown type; ? pancreatic   Lung cancer Maternal Uncle 79   Stroke Maternal Grandmother    Cancer Cousin 78       tonsil; maternal female cousin   Breast cancer Cousin 69       DCIS; maternal female cousin   Colon polyps Son        one polyp; before age 60   Skin cancer Son 73       non-melanoma; shoulder    Social History Social History   Tobacco Use   Smoking status: Former    Packs/day: 0.25    Years: 8.00    Total pack years: 2.00    Types: Cigarettes    Quit date: 11/19/1971    Years since quitting: 50.4   Smokeless tobacco: Never  Vaping Use   Vaping Use: Never used  Substance Use Topics   Alcohol use: Yes    Comment: socially   Drug use: No     Allergies   Patient has no known allergies.   Review of Systems Review of Systems Per HPI  Physical Exam Triage Vital Signs ED Triage Vitals  Enc Vitals Group     BP 04/29/22 2009 132/74     Pulse Rate 04/29/22 2009 77     Resp 04/29/22 2009 20     Temp 04/29/22 2009 97.9 F (36.6 C)     Temp src --      SpO2 04/29/22 2009 97 %     Weight --      Height --       Head Circumference --      Peak Flow --  Pain Score 04/29/22 2007 5     Pain Loc --      Pain Edu? --      Excl. in [place]? --    No data found.  Updated Vital Signs BP 132/74   Pulse 77   Temp 97.9 F (36.6 C)   Resp 20   LMP 08/18/1982 (Approximate)   SpO2 97%   Visual Acuity Right Eye Distance:   Left Eye Distance:   Bilateral Distance:    Right Eye Near:   Left Eye Near:    Bilateral Near:     Physical Exam Vitals and nursing note reviewed.  Constitutional:      Appearance: Normal appearance. She is not ill-appearing.  HENT:     Head: Atraumatic.  Eyes:     Extraocular Movements: Extraocular movements intact.     Conjunctiva/sclera: Conjunctivae normal.  Cardiovascular:     Rate and Rhythm: Normal rate and regular rhythm.     Heart sounds: Normal heart sounds.  Pulmonary:     Effort: Pulmonary effort is normal.     Breath sounds: Normal breath sounds.  Musculoskeletal:        General: Swelling, tenderness and signs of injury present.     Cervical back: Normal range of motion and neck supple.     Comments: Decreased range of motion left great toe  Skin:    General: Skin is warm and dry.     Findings: Bruising present.     Comments: Distal left great toe bruised  Neurological:     Mental Status: She is alert and oriented to person, place, and time.     Comments: Left foot neurovascularly intact  Psychiatric:        Mood and Affect: Mood normal.        Thought Content: Thought content normal.        Judgment: Judgment normal.      UC Treatments / Results  Labs (all labs ordered are listed, but only abnormal results are displayed) Labs Reviewed - No data to display  EKG   Radiology No results found.  Procedures Procedures (including critical care time)  Medications Ordered in UC Medications - No data to display  Initial Impression / Assessment and Plan / UC Course  I have reviewed the triage vital signs and the nursing  notes.  Pertinent labs & imaging results that were available during my care of the patient were reviewed by me and considered in my medical decision making (see chart for details).     X-ray of the left foot showing a fracture of the distal phalanx of the left great toe, placed in a postop shoe and discussed RICE protocol, over-the-counter pain relievers and podiatry follow-up.  She states she already sees podiatry for other reasons and will call them and set up follow-up.  Return for worsening symptoms.  Final Clinical Impressions(s) / UC Diagnoses   Final diagnoses:  Closed nondisplaced fracture of distal phalanx of left great toe, initial encounter     Discharge Instructions      Wear the postop shoe at all times for walking.  Ice, elevation, over-the-counter pain relievers as needed.  Follow-up with [facility] for recheck as soon as possible    ED Prescriptions   None    PDMP not reviewed this encounter.   Volney American, [place] 05/03/22 1350

## 2022-05-30 ENCOUNTER — Other Ambulatory Visit: Payer: Self-pay | Admitting: Family Medicine

## 2022-06-03 ENCOUNTER — Telehealth: Payer: Self-pay | Admitting: Family Medicine

## 2022-06-03 NOTE — Telephone Encounter (Signed)
Spoke with patient she declined AWV, stated she will CB when interested

## 2022-06-18 ENCOUNTER — Telehealth: Payer: Medicare Other | Admitting: Physician Assistant

## 2022-06-18 DIAGNOSIS — W57XXXA Bitten or stung by nonvenomous insect and other nonvenomous arthropods, initial encounter: Secondary | ICD-10-CM | POA: Diagnosis not present

## 2022-06-18 DIAGNOSIS — S70369A Insect bite (nonvenomous), unspecified thigh, initial encounter: Secondary | ICD-10-CM | POA: Diagnosis not present

## 2022-06-18 MED ORDER — DOXYCYCLINE HYCLATE 100 MG PO TABS: 100.0000 mg | ORAL_TABLET | Freq: Two times a day (BID) | ORAL | 0 refills | Status: AC

## 2022-06-18 NOTE — Progress Notes (Signed)
I have spent 5 minutes in review of e-visit questionnaire, review and updating patient chart, medical decision making and response to patient.   Danh Bayus Cody Mansi Tokar, PA-C    

## 2022-06-18 NOTE — Progress Notes (Signed)
E-Visit for Tick Bite  Thank you for describing your tick bite, Here is how we plan to help! Based on the information that you shared with me it looks like you have A tick that bite that we will treat with a short course of doxycycline.  In most cases a tick bite is painless and does not itch.  Most tick bites in which the tick is quickly removed do not require prescriptions. Ticks can transmit several diseases if they are infected and remain attacked to your skin. Therefore the length that the tick was attached and any symptoms you have experienced after the bite are import to accurately develop your custom treatment plan. In most cases a single dose of doxycycline may prevent the development of a more serious condition.  Based on your information I have Provided a home care guide for tick bites and  instructions on when to call for help. I have sent in a course of Doxycycline to take twice daily for 14 days.   You should be able to attach a picture in your response back to me, either from your computer or phone.    Which ticks  are associated with illness?  The Wood Tick (dog tick) is the size of a watermelon seed and can sometimes transmit [facility] spotted fever and [place] tick fever.   The Deer Tick (black-legged tick) is between the size of a poppy seed (pin head) and an apple seed, and can sometimes transmit Lyme disease.  A brown to black tick with a white splotch on its back is likely a female Amblyomma americanum ([place] tick). This tick has been associated with Southern Tick Associated illness ( STARI)  Lyme disease has become the most common tick-borne illness in the [place]. The risk of Lyme disease following a recognized deer tick bite is estimated to be 1%.  The majority of cases of Lyme disease start with a bull's eye rash at the site of the tick bite. The rash can occur days to weeks (typically 7-10 days) after a tick bite. Treatment with antibiotics is  indicated if this rash appears. Flu-like symptoms may accompany the rash, including: fever, chills, headaches, muscle aches, and fatigue. Removing ticks promptly may prevent tick borne disease.  What can be used to prevent Tick Bites?  Insect repellant with at leas 20% DEET. Wearing long pants with sock and shoes. Avoiding tall grass and heavily wooded areas. Checking your skin after being outdoors. Shower with a washcloth after outdoor exposures.  HOME CARE ADVICE FOR TICK BITE  Wood Tick Removal:  Use a pair of tweezers and grasp the wood tick close to the skin (on its head). Pull the wood tick straight upward without twisting or crushing it. Maintain a steady pressure until it releases its grip.   If tweezers aren't available, use fingers, a loop of thread around the jaws, or a needle between the jaws for traction.  Note: covering the tick with petroleum jelly, nail polish or rubbing alcohol doesn't work. Neither does touching the tick with a hot or cold object. Tiny Deer Tick Removal:   Needs to be scraped off with a knife blade or credit card edge. Place tick in a sealed container (e.g. glass jar, zip lock plastic bag), in case your doctor wants to see it. Tick's Head Removal:  If the wood tick's head breaks off in the skin, it must be removed. Clean the skin. Then use a sterile needle to uncover the head and  lift it out or scrape it off.  If a very small piece of the head remains, the skin will eventually slough it off. Antibiotic Ointment:  Wash the wound and your hands with soap and water after removal to prevent catching any tick disease.  Apply an over the counter antibiotic ointment (e.g. bacitracin) to the bite once. Expected Course: Tick bites normally don't itch or hurt. That's why they often go unnoticed. Call Your Doctor If:  You can't remove the tick or the tick's head Fever, a severe head ache, or rash occur in the next 2 weeks Bite begins to look infected Lyme's  disease is common in your area You have not had a tetanus in the last 10 years Your current symptoms become worse    MAKE SURE YOU  Understand these instructions. Will watch your condition. Will get help right away if you are not doing well or get worse.    Thank you for choosing an e-visit.  Your e-visit answers were reviewed by a board certified advanced clinical practitioner to complete your personal care plan. Depending upon the condition, your plan could have included both over the counter or prescription medications.  Please review your pharmacy choice. Make sure the pharmacy is open so you can pick up prescription now. If there is a problem, you may contact your provider through [employer] and have the prescription routed to another pharmacy.  Your safety is important to [place]. If you have drug allergies check your prescription carefully.   For the next 24 hours you can use MyChart to ask questions about today's visit, request a non-urgent call back, or ask for a work or school excuse. You will get an email in the next two days asking about your experience. I hope that your e-visit has been valuable and will speed your recovery.

## 2022-08-12 ENCOUNTER — Encounter: Payer: Self-pay | Admitting: *Deleted

## 2022-08-22 ENCOUNTER — Encounter: Payer: Self-pay | Admitting: Family Medicine

## 2022-08-23 ENCOUNTER — Other Ambulatory Visit: Payer: Self-pay

## 2022-08-23 DIAGNOSIS — M810 Age-related osteoporosis without current pathological fracture: Secondary | ICD-10-CM

## 2022-08-25 ENCOUNTER — Other Ambulatory Visit: Payer: Self-pay | Admitting: Family Medicine

## 2022-08-28 ENCOUNTER — Telehealth: Payer: Self-pay | Admitting: Family Medicine

## 2022-08-28 ENCOUNTER — Other Ambulatory Visit: Payer: Self-pay

## 2022-08-28 DIAGNOSIS — M810 Age-related osteoporosis without current pathological fracture: Secondary | ICD-10-CM

## 2022-08-28 DIAGNOSIS — E039 Hypothyroidism, unspecified: Secondary | ICD-10-CM

## 2022-08-28 DIAGNOSIS — E782 Mixed hyperlipidemia: Secondary | ICD-10-CM

## 2022-08-28 DIAGNOSIS — K219 Gastro-esophageal reflux disease without esophagitis: Secondary | ICD-10-CM

## 2022-08-28 DIAGNOSIS — E538 Deficiency of other specified B group vitamins: Secondary | ICD-10-CM

## 2022-08-28 NOTE — Telephone Encounter (Signed)
Patient states [facility] has not received order. Please resend.

## 2022-08-28 NOTE — Telephone Encounter (Signed)
Patient states: - She has CPE scheduled for 10/30 '@1'$ :30pm - She has been experiencing excessive itching, increased hair loss, and temperature hypersensitivity     Patient requests: -Labs be placed few days prior to CPE so she can discuss results with PCP - T4 be added to labs due to new symptoms experienced

## 2022-08-29 NOTE — Telephone Encounter (Signed)
Patient is scheduled for labs on 09/11/22

## 2022-09-11 ENCOUNTER — Other Ambulatory Visit (INDEPENDENT_AMBULATORY_CARE_PROVIDER_SITE_OTHER): Payer: Medicare Other

## 2022-09-11 DIAGNOSIS — E039 Hypothyroidism, unspecified: Secondary | ICD-10-CM | POA: Diagnosis not present

## 2022-09-11 DIAGNOSIS — K219 Gastro-esophageal reflux disease without esophagitis: Secondary | ICD-10-CM | POA: Diagnosis not present

## 2022-09-11 DIAGNOSIS — E782 Mixed hyperlipidemia: Secondary | ICD-10-CM | POA: Diagnosis not present

## 2022-09-11 LAB — COMPREHENSIVE METABOLIC PANEL
ALT: 14 U/L (ref 0–35)
AST: 18 U/L (ref 0–37)
Albumin: 4.3 g/dL (ref 3.5–5.2)
Alkaline Phosphatase: 74 U/L (ref 39–117)
BUN: 17 mg/dL (ref 6–23)
CO2: 30 mEq/L (ref 19–32)
Calcium: 9.5 mg/dL (ref 8.4–10.5)
Chloride: 104 mEq/L (ref 96–112)
Creatinine, Ser: 0.67 mg/dL (ref 0.40–1.20)
GFR: 88.54 mL/min (ref >60.00)
Glucose, Bld: 93 mg/dL (ref 70–99)
Potassium: 4.9 mEq/L (ref 3.5–5.1)
Sodium: 139 mEq/L (ref 135–145)
Total Bilirubin: 0.6 mg/dL (ref 0.2–1.2)
Total Protein: 6.5 g/dL (ref 6.0–8.3)

## 2022-09-11 LAB — LIPID PANEL
Cholesterol: 260 mg/dL — ABNORMAL HIGH (ref 0–200)
HDL: 73.8 mg/dL (ref >39.00)
LDL Cholesterol: 161 mg/dL — ABNORMAL HIGH (ref 0–99)
NonHDL: 186.33
Total CHOL/HDL Ratio: 4
Triglycerides: 127 mg/dL (ref 0.0–149.0)
VLDL: 25.4 mg/dL (ref 0.0–40.0)

## 2022-09-11 LAB — CBC WITH DIFFERENTIAL/PLATELET
Basophils Absolute: 0 10*3/uL (ref 0.0–0.1)
Basophils Relative: 0.5 % (ref 0.0–3.0)
Eosinophils Absolute: 0.1 10*3/uL (ref 0.0–0.7)
Eosinophils Relative: 2.6 % (ref 0.0–5.0)
HCT: 42.1 % (ref 36.0–46.0)
Hemoglobin: 14.5 g/dL (ref 12.0–15.0)
Lymphocytes Relative: 32.8 % (ref 12.0–46.0)
Lymphs Abs: 1.4 10*3/uL (ref 0.7–4.0)
MCHC: 34.3 g/dL (ref 30.0–36.0)
MCV: 88.9 fl (ref 78.0–100.0)
Monocytes Absolute: 0.4 10*3/uL (ref 0.1–1.0)
Monocytes Relative: 8.8 % (ref 3.0–12.0)
Neutro Abs: 2.4 10*3/uL (ref 1.4–7.7)
Neutrophils Relative %: 55.3 % (ref 43.0–77.0)
Platelets: 231 10*3/uL (ref 150.0–400.0)
RBC: 4.74 Mil/uL (ref 3.87–5.11)
RDW: 12.8 % (ref 11.5–15.5)
WBC: 4.3 10*3/uL (ref 4.0–10.5)

## 2022-09-11 LAB — T3, FREE: T3, Free: 3.8 pg/mL (ref 2.3–4.2)

## 2022-09-11 LAB — TSH: TSH: 1.35 u[IU]/mL (ref 0.35–5.50)

## 2022-09-11 LAB — VITAMIN B12: Vitamin B-12: 604 pg/mL (ref 211–911)

## 2022-09-11 LAB — T4, FREE: Free T4: 1.13 ng/dL (ref 0.60–1.60)

## 2022-09-16 ENCOUNTER — Encounter: Payer: Self-pay | Admitting: Family Medicine

## 2022-09-16 ENCOUNTER — Ambulatory Visit: Payer: Medicare Other | Admitting: Family Medicine

## 2022-09-16 ENCOUNTER — Ambulatory Visit (INDEPENDENT_AMBULATORY_CARE_PROVIDER_SITE_OTHER): Payer: Medicare Other | Admitting: Family Medicine

## 2022-09-16 VITALS — BP 110/80 | HR 77 | Temp 98.4°F | Ht 66.0 in | Wt 132.2 lb

## 2022-09-16 DIAGNOSIS — E782 Mixed hyperlipidemia: Secondary | ICD-10-CM | POA: Diagnosis not present

## 2022-09-16 DIAGNOSIS — H9313 Tinnitus, bilateral: Secondary | ICD-10-CM

## 2022-09-16 DIAGNOSIS — E039 Hypothyroidism, unspecified: Secondary | ICD-10-CM | POA: Diagnosis not present

## 2022-09-16 DIAGNOSIS — H6121 Impacted cerumen, right ear: Secondary | ICD-10-CM

## 2022-09-16 DIAGNOSIS — K219 Gastro-esophageal reflux disease without esophagitis: Secondary | ICD-10-CM

## 2022-09-16 DIAGNOSIS — L299 Pruritus, unspecified: Secondary | ICD-10-CM

## 2022-09-16 DIAGNOSIS — M810 Age-related osteoporosis without current pathological fracture: Secondary | ICD-10-CM | POA: Diagnosis not present

## 2022-09-16 DIAGNOSIS — L659 Nonscarring hair loss, unspecified: Secondary | ICD-10-CM | POA: Diagnosis not present

## 2022-09-16 DIAGNOSIS — I493 Ventricular premature depolarization: Secondary | ICD-10-CM | POA: Diagnosis not present

## 2022-09-16 MED ORDER — LEVOTHYROXINE SODIUM 75 MCG PO TABS: 75.0000 ug | ORAL_TABLET | Freq: Every day | ORAL | 3 refills | Status: DC

## 2022-09-16 MED ORDER — MOMETASONE FUROATE 0.1 % EX CREA: 1.0000 | TOPICAL_CREAM | Freq: Every day | CUTANEOUS | 3 refills | Status: AC | PRN

## 2022-09-16 NOTE — Progress Notes (Signed)
Subjective  Chief Complaint  Patient presents with   Hypothyroidism   Annual Exam    Pt here for Annual Exam and is not fasting    HPI: [name] is a 71 y.o. female who presents to [facility] [place] today for a Female Wellness Visit. She also has the concerns and/or needs as listed above in the chief complaint. These will be addressed in addition to the Health Maintenance Visit.   Wellness Visit: annual visit with health maintenance review and exam without Pap  HM: Sees GYN for female wellness.  Screens are current.  His bone density follow-up osteoarthritis scheduled for March of next year.  Feels well overall. Chronic disease f/u and/or acute problem visit: (deemed necessary to be done in addition to the wellness visit): Chronic medical problems including hypothyroidism, symptomatic PVCs treated with beta-blocker, GERD are all well controlled recent TSH is normal Complains of tinnitus, sensitivity to loud noises and possible mild hearing loss.  Ongoing for several months now.  No trauma.  No pain. Complains of hair thinning, noticing more over the last year or so.  Also has diffuse itching but worse in her scalp without rashing or flaking.  No new hair or skin products.  Takes Benadryl intermittently, not sure if it significantly helps.  No abdominal pain.  No unwanted weight loss.  Assessment  1. Acquired hypothyroidism   2. Frequent PVCs   3. Mixed hyperlipidemia   4. Age-related osteoporosis without current pathological fracture   5. Gastroesophageal reflux disease without esophagitis   6. Tinnitus of both ears   7. Hair thinning   8. Pruritus   9. Impacted cerumen, right ear      Plan  Female Wellness Visit: Age appropriate Health Maintenance and Prevention measures were discussed with patient. Included topics are cancer screening recommendations, ways to keep healthy (see AVS) including dietary and exercise recommendations, regular eye and dental  care, use of seat belts, and avoidance of moderate alcohol use and tobacco use.  BMI: discussed patient's BMI and encouraged positive lifestyle modifications to help get to or maintain a target BMI. HM needs and immunizations were addressed and ordered. See below for orders. See HM and immunization section for updates. Routine labs and screening tests ordered including cmp, cbc and lipids where appropriate. Discussed recommendations regarding Vit D and calcium supplementation (see AVS)  Chronic disease management visit and/or acute problem visit: Hair thinning: Possibly related to longer hair noticing it more important could also be cyclical.  Will monitor.  Normal lab work.  No patchy hair loss.  Can discuss with dermatology, she sees them annually Diffuse itching: Normal lab work.  No red flag symptoms.  Treat with Zyrtec Continue medicines for GERD Chronic beta-blocker Thyroid levels are normal.  Continue levothyroxine 75 mcg daily.  Refilled Cerumen impaction resolved with irrigation.  Hearing screen is normal.   Follow up: 12 months for cpe  Orders Placed This Encounter  Procedures   CBC with Differential/Platelet   Comprehensive metabolic panel   Lipid panel   TSH   Meds ordered this encounter  Medications   levothyroxine (SYNTHROID) 75 MCG tablet    Sig: Take 1 tablet (75 mcg total) by mouth daily.    Dispense:  90 tablet    Refill:  3      Body mass index is 21.34 kg/m. Wt Readings from Last 3 Encounters:  09/16/22 132 lb 3.2 oz (60 kg)  10/01/21 128 lb (58.1 kg)  09/12/21 128 lb 9.6 oz (58.3 kg)     Patient Active Problem List   Diagnosis Date Noted   Acquired hypothyroidism 12/28/2019    Priority: High   Mixed hyperlipidemia 08/31/2019    Priority: High   Frequent PVCs 10/07/2018    Priority: High    Normal cardiac cath and nl EF 08/2019, Dr. [place]    Osteoporosis 07/16/2017    Priority: High    Dr. Talbert Nan: 2017: T = -2.5, started meds: f/u 2019  improved T = - 2.1 on fosamax. F/u dexa 2021: T - -2.2, stopped fosamax after years    Esophageal dysphagia 07/01/2019    Priority: Medium     Followed by  Dr. Collene Mares last seen 06/08/19 EGD ordered.  EGD preformed on [id number] impression: normal appearing widely patent esophagus and GEJ-Z line was measured at 40 cm. Mild antral gastritis. Normal examined duodenum. No specimens collected.     Gastroesophageal reflux disease 07/01/2019    Priority: Medium     Followed by  Dr. Collene Mares last seen 06/08/19    Chronic idiopathic constipation 07/01/2019    Priority: Medium     Followed by  Dr. Collene Mares last seen 06/08/19    Family history of breast cancer 02/21/2018    Priority: Medium     Lifetime risk is greater than 20%. She has history of a benign surgical excisional biopsy of the right breast in 1991.    Laryngopharyngeal reflux (LPR) 07/16/2017    Priority: Medium    Primary osteoarthritis of right hand 04/20/2020    Priority: Low   Allergic conjunctivitis 12/28/2019    Priority: Low   Diverticulosis of colon 12/28/2019    Priority: Low    Multiple large mouth colonoscopy finding    Rosacea 12/28/2019    Priority: Low   Chronic allergic rhinitis 02/21/2018    Priority: Low   Hepatic cyst 02/21/2018    Priority: Low   Nocturnal leg cramps 06/06/2014    Priority: Low   Diverticulitis of colon 12/06/2021   Flatulence, eructation and gas pain 12/06/2021   At high risk for breast cancer 09/12/2021   Genetic testing 08/14/2021    Negative hereditary cancer genetic testing: no pathogenic variants detected in [facility] Multi-Cancer +RNA Panel. Variants of uncertain significance in PMS2 at c.1510G>C (p.Glu504Gln).  The report date is August 13, 2021.   The Multi-Cancer + RNA Panel offered by [facility] includes sequencing and/or deletion/duplication analysis of the following 84 genes:  AIP*, ALK, APC*, ATM*, AXIN2*, BAP1*, BARD1*, BLM*, BMPR1A*, BRCA1*, BRCA2*, BRIP1*, CASR, CDC73*, CDH1*, CDK4,  CDKN1B*, CDKN1C*, CDKN2A, CEBPA, CHEK2*, CTNNA1*, DICER1*, DIS3L2*, EGFR, EPCAM, FH*, FLCN*, GATA2*, GPC3, GREM1, HOXB13, HRAS, KIT, MAX*, MEN1*, MET, MITF, MLH1*, MSH2*, MSH3*, MSH6*, MUTYH*, NBN*, NF1*, NF2*, NTHL1*, PALB2*, PDGFRA, PHOX2B, PMS2*, POLD1*, POLE*, POT1*, PRKAR1A*, PTCH1*, PTEN*, RAD50*, RAD51C*, RAD51D*, RB1*, RECQL4, RET, RUNX1*, SDHA*, SDHAF2*, SDHB*, SDHC*, SDHD*, SMAD4*, SMARCA4*, SMARCB1*, SMARCE1*, STK11*, SUFU*, TERC, TERT, TMEM127*, Tp53*, TSC1*, TSC2*, VHL*, WRN*, and WT1.  RNA analysis is performed for * genes.     Urgency incontinence 04/17/2021   Vitreous degeneration of both eyes 02/18/2019   Health Maintenance  Topic Date Due   Medicare Annual Wellness (AWV)  02/08/2022   DEXA SCAN  08/16/2022   INFLUENZA VACCINE  02/16/2023 (Originally 06/18/2022)   MAMMOGRAM  12/06/2022   TETANUS/TDAP  02/24/2029   COLONOSCOPY (Pts 45-76yr Insurance coverage will need to be confirmed)  07/13/2029   Pneumonia Vaccine 70 Years old  Completed   Hepatitis C  Screening  Completed   Zoster Vaccines- Shingrix  Completed   HPV VACCINES  Aged Out   COVID-19 Vaccine  Discontinued   Immunization History  Administered Date(s) Administered   Influenza, High Dose Seasonal PF 08/22/2017, 08/31/2018, 09/08/2019   Influenza-Unspecified 08/22/2017   PFIZER(Purple Top)SARS-COV-2 Vaccination 01/29/2020, 02/23/2020   Pneumococcal Conjugate-13 11/02/2018   Pneumococcal Polysaccharide-23 12/28/2019   Pneumococcal-Unspecified 10/18/2020   Td 02/25/2019   Zoster Recombinat (Shingrix) 10/16/2020, 02/15/2021   Zoster, Live 04/18/2012   We updated and reviewed the patient's past history in detail and it is documented below. Allergies: Patient has No Known Allergies. Past Medical History Patient  has a past medical history of Cataract, DDD (degenerative disc disease), cervical (2006), Depression (1/06), Diverticulitis, Diverticulosis of colon (12/28/2019), Dysphonia, Frequent PVCs,  Hyperlipidemia, Hypothyroidism, Nocturnal leg cramps (06/06/2014), Non-seasonal allergic rhinitis, Osteoporosis (2017), SUI (stress urinary incontinence, female) (12/07), and Vitamin D deficiency disease (11/07). Past Surgical History Patient  has a past surgical history that includes Hand surgery (Left, 10/2009); Combined hysterectomy vaginal w/ MMK / A&P repair (1610); Tonsillectomy (as child); Wisdom tooth extraction; Liposuction; LEFT HEART CATH AND CORONARY ANGIOGRAPHY (N/A, 08/31/2019); and Breast excisional biopsy (Right, 1991). Family History: Patient family history includes Breast cancer in her mother; Breast cancer (age of onset: 38) in her cousin; Cancer (age of onset: 73) in her cousin; Cancer (age of onset: 81) in her maternal uncle; Cervical cancer (age of onset: 63) in her mother; Colon polyps in her son; Dementia in her mother; Endometriosis in her sister; Heart disease in her brother; Lung cancer (age of onset: 3) in her maternal uncle; Osteoarthritis in her mother; Skin cancer (age of onset: 37) in her son; Stroke in her brother and maternal grandmother; Throat cancer (age of onset: 69) in her maternal uncle. Social History:  Patient  reports that she quit smoking about 50 years ago. Her smoking use included cigarettes. She has a 2.00 pack-year smoking history. She has never used smokeless tobacco. She reports current alcohol use. She reports that she does not use drugs.  Review of Systems: Constitutional: negative for fever or malaise Ophthalmic: negative for photophobia, double vision or loss of vision Cardiovascular: negative for chest pain, dyspnea on exertion, or new LE swelling Respiratory: negative for SOB or persistent cough Gastrointestinal: negative for abdominal pain, change in bowel habits or melena Genitourinary: negative for dysuria or gross hematuria, no abnormal uterine bleeding or disharge Musculoskeletal: negative for new gait disturbance or muscular  weakness Integumentary: negative for new or persistent rashes, no breast lumps Neurological: negative for TIA or stroke symptoms Psychiatric: negative for SI or delusions Allergic/Immunologic: negative for hives  Patient Care Team    Relationship Specialty Notifications Start End  Leamon Arnt, MD PCP - General Family Medicine  12/28/19   Bo Merino, MD Consulting Physician Rheumatology  06/06/14   [facility] [facility] [place] Physician Otolaryngology  07/16/17   Merrilee Seashore, MD Consulting Physician Internal Medicine  08/19/17   Rolla Etienne (Inactive) Physician Assistant Hematology and Oncology  09/25/17   Juanita Craver, MD Consulting Physician Gastroenterology  07/01/19   Evans Lance, MD Consulting Physician Cardiology  12/28/19   Dorothy Spark, MD Consulting Physician Cardiology  12/28/19   Salvadore Dom, MD Consulting Physician Obstetrics and Gynecology  12/28/19   Sydnee Levans, MD Consulting Physician Dermatology  12/28/19   Kathrynn Ducking, MD (Inactive) Consulting Physician Neurology  12/28/19     Objective  Vitals: BP 110/80  Pulse 77   Temp 98.4 F (36.9 C)   Ht _0  (1.676 m)   Wt 132 lb 3.2 oz (60 kg)   LMP 08/18/1982 (Approximate)   SpO2 97%   BMI 21.34 kg/m  General:  Well developed, well nourished, no acute distress  Psych:  Alert and orientedx3,normal mood and affect HEENT:  Normocephalic, atraumatic, non-icteric sclera,  supple neck without adenopathy, mass or thyromegaly, right tm obstructed; clear after irrigation; TM are nl bilaterally Cardiovascular:  Normal S1, S2, RRR without gallop, rub or murmur Respiratory:  Good breath sounds bilaterally, CTAB with normal respiratory effort Gastrointestinal: normal bowel sounds, soft, non-tender, no noted masses. No HSM MSK: no deformities, contusions. Joints are without erythema or swelling.  Skin:  Warm, no rashes or suspicious lesions noted Neurologic:     Mental status is normal. Gross motor and sensory exams are normal. Normal gait. No tremor   Commons side effects, risks, benefits, and alternatives for medications and treatment plan prescribed today were discussed, and the patient expressed understanding of the given instructions. Patient is instructed to call or message via MyChart if he/she has any questions or concerns regarding our treatment plan. No barriers to understanding were identified. We discussed Red Flag symptoms and signs in detail. Patient expressed understanding regarding what to do in case of urgent or emergency type symptoms.  Medication list was reconciled, printed and provided to the patient in AVS. Patient instructions and summary information was reviewed with the patient as documented in the AVS. This note was prepared with assistance of Dragon voice recognition software. Occasional wrong-word or sound-a-like substitutions may have occurred due to the inherent limitations of voice recognition software

## 2022-09-16 NOTE — Patient Instructions (Signed)
Please return in 12 months for your annual complete physical; please come fasting.   Your lab work all looks fine.  Try zyrtec '10mg'$  nightly to help manage your itching symptoms. It could be related to a histamine allergic response.  Hair thinning can be common and cyclical. Monitor it over time and discuss it with your dermatologist when you see her in December.   If you have any questions or concerns, please don't hesitate to send me a message via MyChart or call the office at [phone]. Thank you for visiting with [place] today! It's our pleasure caring for you.   Pruritus Pruritus is an itchy feeling on the skin. One of the most common causes is dry skin, but many different things can cause itching. Most cases of itching do not require medical attention. Sometimes itchy skin can turn into a rash or a secondary infection. Follow these instructions at home: Skin care  Do not use scented soaps, detergents, perfumes, and cosmetic products. Instead, use gentle, unscented versions of these items. Apply moisturizing creams to your skin frequently, at least twice daily. Apply immediately after bathing while skin is still wet. Take medicines or apply medicated creams only as told by your health care provider. This may include: Corticosteroid cream or topical calcineurin inhibitor. Anti-itch lotions containing urea, camphor, or menthol. Oral antihistamines. Do not take hot showers or baths, which can make itching worse. A short, cool shower may help with itching as long as you apply moisturizing lotion after the shower. Apply a cool, wet cloth (cool compress) to the affected areas. You may take lukewarm baths with one of the following: Epsom salts. You can get these at your local pharmacy or grocery store. Follow the instructions on the packaging. Baking soda. Pour a small amount into the bath as told by your health care provider. Colloidal oatmeal. You can get this at your local pharmacy or grocery  store. Follow the instructions on the packaging. Do not scratch your skin. General instructions Avoid wearing tight clothes. Keep a journal to help find out what is causing your itching. Write down: What you eat and drink. What cosmetic products you use. What soaps or detergents you use. What you wear, including jewelry. Use a humidifier. This keeps the air moist, which helps to prevent dry skin. Be aware of any changes in your itchiness. Tell your health care provider about any changes. Contact a health care provider if: The itching does not go away after several days. You notice redness, warmth, or drainage on the skin where you have scratched. You are unusually thirsty or urinating more than normal. Your skin tingles or feels numb. Your skin or the white parts of your eyes turn yellow (jaundice). You feel weak. You have any of the following: Night sweats. Tiredness (fatigue). Weight loss. Abdominal pain. Summary Pruritus is an itchy feeling on the skin. One of the most common causes is dry skin, but many different conditions and factors can cause itching. Apply moisturizing creams to your skin frequently, at least twice daily. Apply immediately after bathing while skin is still wet. Take medicines or apply medicated creams only as told by your health care provider. Do not take hot showers or baths. Do not use scented soaps, detergents, perfumes, or cosmetic products. Keep a journal to help find out what is causing your itching. This information is not intended to replace advice given to you by your health care provider. Make sure you discuss any questions you have with your health  care provider. Document Revised: 12/12/2021 Document Reviewed: 12/12/2021 Elsevier Patient Education  [place].

## 2022-09-19 ENCOUNTER — Encounter: Payer: Self-pay | Admitting: Family Medicine

## 2022-12-04 ENCOUNTER — Telehealth: Payer: Self-pay | Admitting: Family Medicine

## 2022-12-04 DIAGNOSIS — L299 Pruritus, unspecified: Secondary | ICD-10-CM | POA: Diagnosis not present

## 2022-12-04 DIAGNOSIS — L659 Nonscarring hair loss, unspecified: Secondary | ICD-10-CM | POA: Diagnosis not present

## 2022-12-04 NOTE — Telephone Encounter (Signed)
Pt is requesting a copy of most recent Vit D & iron labs. Her dermatologist office is needing a copy of these. Please advise.

## 2022-12-20 ENCOUNTER — Ambulatory Visit
Admission: RE | Admit: 2022-12-20 | Discharge: 2022-12-20 | Disposition: A | Discharge status: Home / Self Care | Payer: Medicare Other | Source: Ambulatory Visit | Attending: Internal Medicine | Admitting: Internal Medicine

## 2022-12-20 ENCOUNTER — Ambulatory Visit: Payer: Medicare Other | Attending: Internal Medicine | Admitting: Internal Medicine

## 2022-12-20 ENCOUNTER — Encounter: Payer: Self-pay | Admitting: Internal Medicine

## 2022-12-20 VITALS — BP 106/68 | HR 66 | Ht 66.0 in | Wt 133.0 lb

## 2022-12-20 DIAGNOSIS — I493 Ventricular premature depolarization: Secondary | ICD-10-CM | POA: Diagnosis not present

## 2022-12-20 DIAGNOSIS — R0600 Dyspnea, unspecified: Secondary | ICD-10-CM | POA: Diagnosis not present

## 2022-12-20 NOTE — Patient Instructions (Addendum)
Medication Instructions:  Your physician recommends that you continue on your current medications as directed. Please refer to the Current Medication list given to you today.  *If you need a refill on your cardiac medications before your next appointment, please call your pharmacy*  Lab Work: None ordered.  If you have labs (blood work) drawn today and your tests are completely normal, you will receive your results only by: [place] (if you have MyChart) OR A paper copy in the mail If you have any lab test that is abnormal or we need to change your treatment, we will call you to review the results.  Testing/Procedures:  Dr. Cristopher Peru ordered an Exercise treadmill stress test, and a  Chest Xray, PA/LAT.  Please help patient schedule accordingly.  Follow-Up: At [facility], you and your health needs are our priority.  As part of our continuing mission to provide you with exceptional heart care, we have created designated Provider Care Teams.  These Care Teams include your primary Cardiologist (physician) and Advanced Practice Providers (APPs -  Physician Assistants and Nurse Practitioners) who all work together to provide you with the care you need, when you need it.  We recommend signing up for the patient portal called "MyChart".  Sign up information is provided on this After Visit Summary.  MyChart is used to connect with patients for Virtual Visits (Telemedicine).  Patients are able to view lab/test results, encounter notes, upcoming appointments, etc.  Non-urgent messages can be sent to your provider as well.   To learn more about what you can do with MyChart, go to [email or web address].    Your next appointment:   1 year(s)  The format for your next appointment:   In Person  Provider:   Cristopher Peru, MD{or one of the following Advanced Practice Providers on your designated Care Team:   Tommye Standard, [place] Legrand Como "Jonni Sanger" Chalmers Cater, [place]   Exercise Stress Test An  exercise stress test is done to collect information about how your heart functions during exercise. The test is done while you are walking on a treadmill or using a stationary bike. The goal is to raise your heart rate and "stress" the heart. The heart is evaluated before, during, and after you exercise. An electrocardiogram (ECG) will be used to monitor the heart, and your blood pressure will also be monitored. In some cases, nuclear scanning or an ultrasound of the heart (echocardiogram) will also be done to evaluate your heart. An exercise stress test is done to look for coronary artery disease (CAD). The test may also be done to: Evaluate your limits of exercise during cardiac rehabilitation. Check for high blood pressure during exercise. Check how well you can exercise after such treatments as coronary stenting or new medicines. Check for problems with blood flow to your arms and legs during exercise. If you have an abnormal test result, this may mean that you are not getting enough blood flow to your heart during exercise. More testing may be needed to understand why your test was not normal. Tell a health care provider about: Any allergies you have. All medicines you are taking, including vitamins, herbs, eye drops, creams, and over-the-counter medicines. Any surgeries you have had, especially if you have an implantable cardioverter defibrillator (ICD) or pacemaker. Any bleeding problems you have. Any medical conditions you have. Whether you are pregnant or may be pregnant. What are the risks? Generally, this is a safe test. However, problems may occur, including: Pain or pressure in the  following areas: Chest. Jaw or neck. Between your shoulder blades. Down your left arm. Legs (claudication). Dizziness or light-headedness. Shortness of breath. Irregular heartbeat (arrhythmia). Nausea or vomiting. What happens before the test? Follow instructions from your health care provider about  eating or drinking restrictions. You may be told to avoid all forms of caffeine for 24 hours before the test. This includes coffee, tea (even decaffeinated tea), caffeinated sodas, chocolate, cocoa, and certain pain medicines. Ask your health care provider about: Taking over-the-counter medicines, vitamins, herbs, and supplements. Changing or stopping your regular medicines. This is especially important if you are taking diabetes medicines or beta-blocker medicines. If you have diabetes, ask how you are to take your insulin or pills. It is common to adjust your insulin dose the morning of the test. If you are taking beta-blocker medicines, it is important to talk to your health care provider about these medicines well before the date of your test. Taking beta-blocker medicines may interfere with the test. In some cases, these medicines may need to be changed or stopped 24 hours or more before the test. If you wear a nitroglycerin patch, it may need to be removed prior to the test. Ask your health care provider if the patch should be removed before the test. Do not use any products that contain nicotine or tobacco for 4 hours before the test, or as told by the health care provider. These products include cigarettes, chewing tobacco, and vaping devices, such as e-cigarettes. If you need help quitting, ask your health care provider. If you use an inhaler for a breathing condition, bring it with you to the test. Do not apply lotions, powders, creams, or oils on your chest prior to the test. Wear loose-fitting clothes and comfortable walking shoes. What happens during the test?  Multiple electrodes will be attached to your chest. Multiple wires will be attached to the electrodes. These will transfer the electrical impulses from your heart to the ECG machine. Your heart will be monitored both at rest and while exercising. If you are also having an echocardiogram or nuclear scanning, images of your heart  will be taken before and after you exercise. A blood pressure cuff will be placed around your arm to measure your blood pressure throughout the test. You will feel it tighten and loosen throughout the test. An oxygen saturation monitor will be placed on your finger to check oxygen levels throughout the test. You will walk on a treadmill or use a stationary bike. If you cannot use these, you may be asked to turn a crank with your hands. You will start at a slow pace or level on the exercise machine. The exercise difficulty will be slowly increased to raise your heart rate. In the case of a treadmill, the speed and incline will gradually be increased. You may be asked to periodically breathe into a tube. This measures the gases you breathe out. You will be asked how you are feeling throughout the test. You will be asked to rate your level of exertion. Tell the health care team right away if you feel: Chest pain. Dizziness. Shortness of breath. Too fatigued to continue. Pain or aching in your legs or arms. You will exercise until you have symptoms or until you reach a target heart rate. The test will also be stopped if you have abnormal changes in your blood pressure or ECG readings, or if you develop an irregular heartbeat (arrhythmia). The procedure may vary among health care providers and hospitals.  What can I expect after the test? You will sit down and recover from the exercise. Your blood pressure, heart rate, and ECG will be monitored until you recover. You may return to your normal schedule, including diet, activities, and medicines, unless your health care provider tells you otherwise. It is up to you to get your test results. Ask your health care provider, or the department that is doing the test, when your results will be ready. Summary An exercise stress test is a test that is done to collect information about how your heart functions during exercise. This test is done to look for  coronary artery disease (CAD). During this test, you will walk on a treadmill or use an exercise bike to raise your heart rate. It is important to follow instructions from your health care provider about eating and drinking restrictions before the test. This may include avoiding caffeine and certain medicines before the test. This information is not intended to replace advice given to you by your health care provider. Make sure you discuss any questions you have with your health care provider. Document Revised: 09/18/2021 Document Reviewed: 09/18/2021 Elsevier Patient Education  [place].  Chest X-Ray A chest X-ray is a test that uses radiation to create pictures of the organs in your chest, including the lungs, the heart, and the ribs. Chest X-rays are used to look for many health conditions, including heart failure, pneumonia, tuberculosis, rib fractures, breathing disorders, and cancer. They may be used to diagnose chest pain, constant coughing, or trouble breathing. Tell a health care provider about: Any allergies you have. All medicines you are taking, including vitamins, herbs, eye drops, creams, and over-the-counter medicines. Any surgeries you have had. Any medical conditions you have. Whether you are pregnant or may be pregnant. What are the risks? Getting a chest X-ray is a safe procedure. However, you will be exposed to a small amount of radiation. Being exposed to too much radiation over a lifetime can increase the risk of cancer. This risk is small, but it may occur if you have many X-rays throughout your life. What happens before the procedure? You may be asked to remove glasses, jewelry, and any other metal objects. You will be asked to undress from the waist up. You may be given a hospital gown to wear. You may be asked to wear a protective lead apron to protect other parts of your body from radiation. What happens during the procedure?  You will be asked to stand  still as each picture is taken. This ensures that good pictures are taken. You will be asked to take a deep breath and hold it for a few seconds. The X-ray machine will create a picture of your chest using a tiny burst of radiation. This is painless. More pictures may be taken from other angles. Typically, one picture will be taken while you face the X-ray camera, and another picture will be taken from the side while you stand. If you cannot stand, you may be asked to lie down. The procedure may vary among health care providers and hospitals. What can I expect after procedure? The X-ray will be reviewed by your health care provider or an X-ray specialist (radiologist). You may return to your normal, everyday life, including diet, activities, and medicines, unless your health care provider tells you not to do that. It is up to you to get your test results. Ask your health care provider, or the department that is doing the procedure, when  your results will be ready. Your health care provider will tell you if you need more tests or a follow-up exam. Keep all follow-up visits. This is important. Summary A chest X-ray is a safe, painless test that uses radiation to create pictures of the organs inside your chest, including the lungs, heart, and ribs. You will need to undress from the waist up and remove jewelry and metal objects before the procedure. You will be exposed to a small amount of radiation during the procedure. The X-ray machine will take one or more pictures of your chest while you remain as still as possible. Later, a health care provider or specialist will review the test results with you. This information is not intended to replace advice given to you by your health care provider. Make sure you discuss any questions you have with your health care provider. Document Revised: 07/18/2021 Document Reviewed: 02/04/2020 Elsevier Patient Education  [place].

## 2022-12-20 NOTE — Progress Notes (Signed)
HPI Mrs. Ostrow returns today for ongoing evaluation of her PVC's and sob. Since I saw her over a year ago, her dyspnea is improved. Her palpitations have improved. She has been maintained on toprol 25. On the higher dose she cannot feel a difference.She notes that at times she has a hard time getting a deep breath and notes some palpitations and sob with exertion. No anginal symptoms. No syncope. No Known Allergies   Current Outpatient Medications  Medication Sig Dispense Refill   Calcium Carbonate (CALCIUM 600 PO) Take by mouth. 1200 mg     Cholecalciferol (VITAMIN D-3) 5000 UNITS TABS Take 5,000 Units by mouth daily.      [employer] MAGNESIUM CITRATE PO Take 250 mg by mouth daily.      Cyanocobalamin (B-12) 5000 MCG SUBL Place under the tongue. Taking twice per month.     famotidine (PEPCID) 20 MG tablet Take 20 mg by mouth at bedtime. PRN     fluticasone (FLONASE) 50 MCG/ACT nasal spray Place 1 spray into both nostrils daily as needed for allergies.      levothyroxine (SYNTHROID) 75 MCG tablet Take 1 tablet (75 mcg total) by mouth daily. 90 tablet 3   metoprolol succinate (TOPROL-XL) 25 MG 24 hr tablet Take 1 tablet (25 mg total) by mouth daily. 90 tablet 3   metroNIDAZOLE (METROCREAM) 0.75 % cream Apply 1 application topically daily as needed (rosacea).      mometasone (ELOCON) 0.1 % cream Apply 1 Application topically daily as needed (itchy ears). 45 g 3   Multiple Vitamins-Minerals (HAIR/SKIN/NAILS) CAPS Take by mouth.     olopatadine (PATANOL) 0.1 % ophthalmic solution Place 1 drop into both eyes 2 (two) times daily as needed for allergies.      No current facility-administered medications for this visit.     Past Medical History:  Diagnosis Date   Cataract    DDD (degenerative disc disease), cervical 2006   Depression 1/06   resolved   Diverticulitis    Diverticulosis of colon 12/28/2019   Multiple large mouth colonoscopy finding   Dysphonia    Frequent PVCs     Hyperlipidemia    Hypothyroidism    Nocturnal leg cramps 06/06/2014   Non-seasonal allergic rhinitis    Osteoporosis 2017   SUI (stress urinary incontinence, female) 12/07   Vitamin D deficiency disease 11/07    ROS:   All systems reviewed and negative except as noted in the HPI.   Past Surgical History:  Procedure Laterality Date   BREAST EXCISIONAL BIOPSY Right 1991   benign   COMBINED HYSTERECTOMY VAGINAL W/ MMK / A&P REPAIR  1983   HAND SURGERY Left 10/2009   S-L    LEFT HEART CATH AND CORONARY ANGIOGRAPHY N/A 08/31/2019   Procedure: LEFT HEART CATH AND CORONARY ANGIOGRAPHY;  Surgeon: Martinique, Peter M, MD;  Location: [facility];  Service: Cardiovascular;  Laterality: N/A;   Left needle biopsy     LIPOSUCTION     TONSILLECTOMY  as child   WISDOM TOOTH EXTRACTION       Family History  Problem Relation Age of Onset   Cervical cancer Mother 20   Osteoarthritis Mother    Dementia Mother    Breast cancer Mother        dx early 84s; bilateral   Endometriosis Sister    Heart disease Brother    Stroke Brother    Throat cancer Maternal Uncle 55   Cancer Maternal Uncle  15       unknown type; ? pancreatic   Lung cancer Maternal Uncle 79   Stroke Maternal Grandmother    Cancer Cousin 5       tonsil; maternal female cousin   Breast cancer Cousin 67       DCIS; maternal female cousin   Colon polyps Son        one polyp; before age 38   Skin cancer Son 50       non-melanoma; shoulder     Social History   Socioeconomic History   Marital status: Married    Spouse name: Not on file   Number of children: 2   Years of education: college-1   Highest education level: Not on file  Occupational History   Occupation: Retired     Fish farm manager: [employer].  Tobacco Use   Smoking status: Former    Packs/day: 0.25    Years: 8.00    Total pack years: 2.00    Types: Cigarettes    Quit date: 11/19/1971    Years since quitting: 51.1   Smokeless tobacco: Never   Vaping Use   Vaping Use: Never used  Substance and Sexual Activity   Alcohol use: Yes    Comment: socially   Drug use: No   Sexual activity: Not Currently    Partners: Male    Birth control/protection: Surgical, Post-menopausal    Comment: hysterectomy  Other Topics Concern   Not on file  Social History Narrative   Recently retired Feb 2021   Social Determinants of Health   Financial Resource Strain: [place]  (02/08/2021)   Overall Financial Resource Strain (CARDIA)    Difficulty of Paying Living Expenses: Not hard at all  Food Insecurity: No Food Insecurity (02/08/2021)   Hunger Vital Sign    Worried About Running Out of Food in the Last Year: Never true    [place] in the Last Year: Never true  Transportation Needs: No Transportation Needs (02/08/2021)   PRAPARE - Hydrologist (Medical): No    Lack of Transportation (Non-Medical): No  Physical Activity: Sufficiently Active (02/08/2021)   Exercise Vital Sign    Days of Exercise per Week: 7 days    Minutes of Exercise per Session: 50 min  Stress: Stress Concern Present (02/08/2021)   [place]    Feeling of Stress : To some extent  Social Connections: Moderately Isolated (02/08/2021)   Social Connection and Isolation Panel [NHANES]    Frequency of Communication with Friends and Family: More than three times a week    Frequency of Social Gatherings with Friends and Family: More than three times a week    Attends Religious Services: Never    Marine scientist or Organizations: No    Attends Archivist Meetings: Never    Marital Status: Married  Human resources officer Violence: Not At Risk (02/08/2021)   Humiliation, Afraid, Rape, and Kick questionnaire    Fear of Current or Ex-Partner: No    Emotionally Abused: No    Physically Abused: No    Sexually Abused: No     BP 106/68   Pulse 66   Ht '5\' 6"'$  (1.676 m)    Wt 133 lb (60.3 kg)   LMP 08/18/1982 (Approximate)   SpO2 99%   BMI 21.47 kg/m   Physical Exam:  Well appearing NAD HEENT: Unremarkable Neck:  No JVD, no  thyromegally Lymphatics:  No adenopathy Back:  No CVA tenderness Lungs:  Clear with no wheezes HEART:  Regular rate rhythm, no murmurs, no rubs, no clicks Abd:  soft, positive bowel sounds, no organomegally, no rebound, no guarding Ext:  2 plus pulses, no edema, no cyanosis, no clubbing Skin:  No rashes no nodules Neuro:  CN II through XII intact, motor grossly intact  EKG nsr   Assess/Plan: 1. Exertional dyspnea - her symptoms are a little worse. She is concerned about this and I asked her to check a CXR and undergo an exercise test. 2. PVC's - her symptoms are much improved. She will continue toprol.   Carleene Overlie Jerrald Doverspike,MD

## 2022-12-22 NOTE — Addendum Note (Signed)
Addended by: Janan Halter F on: 12/22/2022 09:24 AM   Modules accepted: Orders

## 2022-12-23 ENCOUNTER — Telehealth: Payer: Self-pay

## 2022-12-23 ENCOUNTER — Other Ambulatory Visit: Payer: Self-pay | Admitting: Family Medicine

## 2022-12-23 ENCOUNTER — Encounter: Payer: Self-pay | Admitting: Internal Medicine

## 2022-12-23 DIAGNOSIS — R0600 Dyspnea, unspecified: Secondary | ICD-10-CM

## 2022-12-23 DIAGNOSIS — R9389 Abnormal findings on diagnostic imaging of other specified body structures: Secondary | ICD-10-CM

## 2022-12-23 DIAGNOSIS — Z1231 Encounter for screening mammogram for malignant neoplasm of breast: Secondary | ICD-10-CM

## 2022-12-23 NOTE — Telephone Encounter (Signed)
-----   Message from Evans Lance, MD sent at 12/21/2022 12:43 PM EST ----- With dyspnea and an abnormal CXR with biapical pleural/pulmonary scarring, please obtain PFT's with a [facility]CO.

## 2022-12-23 NOTE — Telephone Encounter (Signed)
Called to update patient on CXR results, Dr. Tanna Furry POC to have her do a PFT with DLCO.    Orders entered per Dr. Lovena Le, PFT with [facility] requested at [facility].     Message left on voicemail.  Follow up required.

## 2022-12-30 ENCOUNTER — Encounter: Payer: Self-pay | Admitting: Internal Medicine

## 2023-01-02 ENCOUNTER — Ambulatory Visit: Payer: Medicare Other | Attending: Internal Medicine

## 2023-01-02 DIAGNOSIS — I493 Ventricular premature depolarization: Secondary | ICD-10-CM | POA: Diagnosis not present

## 2023-01-02 DIAGNOSIS — R0600 Dyspnea, unspecified: Secondary | ICD-10-CM | POA: Insufficient documentation

## 2023-01-02 LAB — EXERCISE TOLERANCE TEST
Angina Index: 0
Base ST Depression (mm): 0 mm
Duke Treadmill Score: -5
Estimated workload: 9.7
Exercise duration (min): 7 min
Exercise duration (sec): 49 s
MPHR: 150 {beats}/min
Peak HR: 142 {beats}/min
Percent HR: 94 %
RPE: 17
Rest HR: 68 {beats}/min
ST Depression (mm): 2.5 mm

## 2023-01-02 NOTE — Addendum Note (Signed)
Addended by: Evans Lance on: 01/02/2023 08:18 AM   Modules accepted: Orders

## 2023-01-02 NOTE — Addendum Note (Signed)
Addended by: Stanton Kidney on: 01/02/2023 07:57 AM   Modules accepted: Orders

## 2023-01-03 ENCOUNTER — Ambulatory Visit (HOSPITAL_COMMUNITY)
Admission: RE | Admit: 2023-01-03 | Discharge: 2023-01-03 | Disposition: A | Discharge status: Home / Self Care | Payer: Medicare Other | Source: Ambulatory Visit | Attending: Internal Medicine | Admitting: Internal Medicine

## 2023-01-03 DIAGNOSIS — R9389 Abnormal findings on diagnostic imaging of other specified body structures: Secondary | ICD-10-CM | POA: Insufficient documentation

## 2023-01-03 DIAGNOSIS — R0600 Dyspnea, unspecified: Secondary | ICD-10-CM | POA: Diagnosis not present

## 2023-01-03 LAB — PULMONARY FUNCTION TEST
DL/VA % pred: 92 %
DL/VA: 3.78 ml/min/mmHg/L
DLCO unc % pred: 79 %
DLCO unc: 16.49 ml/min/mmHg
FEF 25-75 Post: 2.64 L/sec
FEF 25-75 Pre: 2.74 L/sec
FEF2575-%Change-Post: -3 %
FEF2575-%Pred-Post: 131 %
FEF2575-%Pred-Pre: 135 %
FEV1-%Change-Post: 0 %
FEV1-%Pred-Post: 106 %
FEV1-%Pred-Pre: 106 %
FEV1-Post: 2.62 L
FEV1-Pre: 2.61 L
FEV1FVC-%Change-Post: 0 %
FEV1FVC-%Pred-Pre: 106 %
FEV6-%Change-Post: 0 %
FEV6-%Pred-Post: 103 %
FEV6-%Pred-Pre: 103 %
FEV6-Post: 3.21 L
FEV6-Pre: 3.22 L
FEV6FVC-%Pred-Post: 104 %
FEV6FVC-%Pred-Pre: 104 %
FVC-%Change-Post: 0 %
FVC-%Pred-Post: 98 %
FVC-%Pred-Pre: 99 %
FVC-Post: 3.21 L
FVC-Pre: 3.22 L
Post FEV1/FVC ratio: 82 %
Post FEV6/FVC ratio: 100 %
Pre FEV1/FVC ratio: 81 %
Pre FEV6/FVC Ratio: 100 %
RV % pred: 69 %
RV: 1.6 L
TLC % pred: 86 %
TLC: 4.61 L

## 2023-01-03 MED ORDER — ALBUTEROL SULFATE (2.5 MG/3ML) 0.083% IN NEBU
2.5000 mg | INHALATION_SOLUTION | Freq: Once | RESPIRATORY_TRACT | Status: AC
  Administered 2023-01-03: 2.5 mg via RESPIRATORY_TRACT

## 2023-01-04 ENCOUNTER — Other Ambulatory Visit: Payer: Self-pay | Admitting: Internal Medicine

## 2023-01-06 ENCOUNTER — Telehealth: Payer: Self-pay

## 2023-01-06 ENCOUNTER — Telehealth: Payer: Self-pay | Admitting: Genetic Counselor

## 2023-01-06 NOTE — Telephone Encounter (Signed)
-----   Message from Gerarda Gunther sent at 12/27/2022  8:48 AM EST ----- Regarding: Consuello Masse this patient is on my schedule for next week for a treadmill. Can you enter the attestation and send to Dr Lovena Le to sign.  Thanks, [employer]

## 2023-01-06 NOTE — Telephone Encounter (Signed)
Patient requested copy of genetic testing notes/results/pedigree.  Send by secure email and to home address.  Also gave verbal permission to share pedigree and genetics results with sister Fletcher Anon) as needed.

## 2023-01-07 ENCOUNTER — Ambulatory Visit
Admission: RE | Admit: 2023-01-07 | Discharge: 2023-01-07 | Disposition: A | Discharge status: Home / Self Care | Payer: Medicare Other | Source: Ambulatory Visit | Attending: Family Medicine | Admitting: Family Medicine

## 2023-01-07 DIAGNOSIS — Z1231 Encounter for screening mammogram for malignant neoplasm of breast: Secondary | ICD-10-CM | POA: Diagnosis not present

## 2023-01-09 ENCOUNTER — Telehealth: Payer: Self-pay | Admitting: Internal Medicine

## 2023-01-09 DIAGNOSIS — R9431 Abnormal electrocardiogram [ECG] [EKG]: Secondary | ICD-10-CM

## 2023-01-09 NOTE — Telephone Encounter (Signed)
Patient is returning call to Merrilee Seashore, Therapist, sports. Requesting return call.

## 2023-01-09 NOTE — Telephone Encounter (Signed)
Pt called back per MyChart message, and [facility] triage call received.    Pt made aware of Dr Tanna Furry review of her PFT results, and that they were only mildly abnormal.  He recommends f/u in 5 years, pt understood.    Pt also made aware of  ETT results.  Per Dr. Lovena Le, ECG reflected ischemia during ETT, and a coronary CTA was ordered for the positive stress test result.  Pt had some questions, and was educated on the coronary CTA, and how this image is helpful in determining next steps in plan of care.    Pt wanted the test scheduled for when her husband will be in the hospital if possible.  She is open to having Coronary CTA done, and will be at [facility] on 01/13/2023.  I could not guarantee anything, but told her I would communicate her request.  I will order the coronary CTA per Dr. Tanna Furry request.

## 2023-01-10 ENCOUNTER — Other Ambulatory Visit: Payer: Self-pay | Admitting: Family Medicine

## 2023-01-10 DIAGNOSIS — R928 Other abnormal and inconclusive findings on diagnostic imaging of breast: Secondary | ICD-10-CM

## 2023-01-13 ENCOUNTER — Telehealth (HOSPITAL_COMMUNITY): Payer: Self-pay | Admitting: *Deleted

## 2023-01-13 NOTE — Telephone Encounter (Signed)
Reaching out to patient to offer assistance regarding upcoming cardiac imaging study; pt verbalizes understanding of appt date/time, parking situation and where to check in, pre-test NPO status and verified current allergies; name and call back number provided for further questions should they arise  Gordy Clement RN Navigator Cardiac Imaging [facility] [phone] office [phone] cell  Patient to take her daily medications.  She has attempted this test in the past and was canceled due to PVCs. Informed her that if she did have PVC's again when she was with [place], that it would be a possibility that we could cancel her scan. She verbalized understanding and is aware to arrive at 10am.

## 2023-01-14 ENCOUNTER — Ambulatory Visit (HOSPITAL_COMMUNITY)
Admission: RE | Admit: 2023-01-14 | Discharge: 2023-01-14 | Disposition: A | Discharge status: Home / Self Care | Payer: Medicare Other | Source: Ambulatory Visit | Attending: Internal Medicine | Admitting: Internal Medicine

## 2023-01-14 DIAGNOSIS — R9431 Abnormal electrocardiogram [ECG] [EKG]: Secondary | ICD-10-CM | POA: Diagnosis not present

## 2023-01-14 MED ORDER — IOHEXOL 350 MG/ML SOLN
100.0000 mL | Freq: Once | INTRAVENOUS | Status: AC | PRN
  Administered 2023-01-14: 100 mL via INTRAVENOUS

## 2023-01-14 MED ORDER — NITROGLYCERIN 0.4 MG SL SUBL
0.8000 mg | SUBLINGUAL_TABLET | Freq: Once | SUBLINGUAL | Status: AC
  Administered 2023-01-14: 0.8 mg via SUBLINGUAL

## 2023-01-14 MED ORDER — NITROGLYCERIN 0.4 MG SL SUBL
SUBLINGUAL_TABLET | SUBLINGUAL | Status: AC
  Filled 2023-01-14: qty 2

## 2023-01-15 ENCOUNTER — Encounter: Payer: Self-pay | Admitting: Internal Medicine

## 2023-01-17 ENCOUNTER — Telehealth: Payer: Self-pay

## 2023-01-17 NOTE — Telephone Encounter (Signed)
-----   Message from Evans Lance, MD sent at 01/15/2023  8:34 PM EST ----- Sharon Harrell, let her know that the CT scan shows no evidence of a blocked artery causing shortness of breath.

## 2023-01-30 ENCOUNTER — Ambulatory Visit
Admission: RE | Admit: 2023-01-30 | Discharge: 2023-01-30 | Disposition: A | Discharge status: Home / Self Care | Payer: Medicare Other | Source: Ambulatory Visit | Attending: Family Medicine | Admitting: Family Medicine

## 2023-01-30 ENCOUNTER — Encounter: Payer: Self-pay | Admitting: Family Medicine

## 2023-01-30 ENCOUNTER — Telehealth: Payer: Self-pay | Admitting: Internal Medicine

## 2023-01-30 DIAGNOSIS — M810 Age-related osteoporosis without current pathological fracture: Secondary | ICD-10-CM

## 2023-01-30 DIAGNOSIS — R928 Other abnormal and inconclusive findings on diagnostic imaging of breast: Secondary | ICD-10-CM

## 2023-01-30 DIAGNOSIS — M85851 Other specified disorders of bone density and structure, right thigh: Secondary | ICD-10-CM | POA: Diagnosis not present

## 2023-01-30 DIAGNOSIS — R931 Abnormal findings on diagnostic imaging of heart and coronary circulation: Secondary | ICD-10-CM

## 2023-01-30 DIAGNOSIS — Z780 Asymptomatic menopausal state: Secondary | ICD-10-CM | POA: Diagnosis not present

## 2023-01-30 DIAGNOSIS — Z1231 Encounter for screening mammogram for malignant neoplasm of breast: Secondary | ICD-10-CM

## 2023-02-03 MED ORDER — ROSUVASTATIN CALCIUM 5 MG PO TABS: 5.0000 mg | ORAL_TABLET | Freq: Every day | ORAL | 3 refills | Status: DC

## 2023-02-03 NOTE — Telephone Encounter (Signed)
Pt called back to share Dr. Tanna Furry orders from 01/30/2023.  He sent MyChart message to Pt.   Pt may start Crestor 5 mg daily If Pt does not want Crestor, Dr. Lovena Le recommends low fat / Plant based diet.   Pt question answered, no info assessed on scan to explain shortness of breath.  Pt encouraged to contact [facility] with new or worsening symptoms.  Dr. Lovena Le also ordered increased aerobic exercise like walking 5 or more days week.   Pt called and understood Dr Tanna Furry orders. Pt stated she would like to try Crestor 5 mg daily.  Pt pharmacy confirmed, and will send into pharmacy today.  Pt advised to call [facility] with new or worsening symptoms, or go to the nearest ER.  Pt understood.

## 2023-02-04 ENCOUNTER — Telehealth: Payer: Self-pay

## 2023-02-04 DIAGNOSIS — Z9189 Other specified personal risk factors, not elsewhere classified: Secondary | ICD-10-CM

## 2023-02-04 NOTE — Telephone Encounter (Signed)
-----   Message from Salvadore Dom, MD sent at 02/04/2023 11:05 AM EDT ----- The patient is overdue for a B&P exam, please call to schedule. She is also eligible for breast MRI, see if she would like [place] to schedule that for her.

## 2023-02-04 NOTE — Telephone Encounter (Signed)
Spoke with patient. AEX was scheduled earlier today for 04/17/23.    She would like to arrange breast MRI. Order placed and I provided her with the phone number and prompts for her to call and schedule when she is ready.

## 2023-02-10 NOTE — Telephone Encounter (Signed)
Bilateral Breast MRI scheduled 03/03/23 at [place].  Routed to [facility] for PA inquiry.

## 2023-02-12 DIAGNOSIS — L719 Rosacea, unspecified: Secondary | ICD-10-CM | POA: Diagnosis not present

## 2023-02-12 DIAGNOSIS — L710 Perioral dermatitis: Secondary | ICD-10-CM | POA: Diagnosis not present

## 2023-02-12 DIAGNOSIS — L578 Other skin changes due to chronic exposure to nonionizing radiation: Secondary | ICD-10-CM | POA: Diagnosis not present

## 2023-02-12 DIAGNOSIS — L814 Other melanin hyperpigmentation: Secondary | ICD-10-CM | POA: Diagnosis not present

## 2023-02-12 DIAGNOSIS — D229 Melanocytic nevi, unspecified: Secondary | ICD-10-CM | POA: Diagnosis not present

## 2023-02-12 DIAGNOSIS — D1801 Hemangioma of skin and subcutaneous tissue: Secondary | ICD-10-CM | POA: Diagnosis not present

## 2023-02-12 DIAGNOSIS — L821 Other seborrheic keratosis: Secondary | ICD-10-CM | POA: Diagnosis not present

## 2023-02-20 ENCOUNTER — Encounter: Payer: Self-pay | Admitting: Podiatry

## 2023-02-20 ENCOUNTER — Ambulatory Visit (INDEPENDENT_AMBULATORY_CARE_PROVIDER_SITE_OTHER): Payer: Medicare Other | Admitting: Podiatry

## 2023-02-20 DIAGNOSIS — L600 Ingrowing nail: Secondary | ICD-10-CM

## 2023-02-20 NOTE — Progress Notes (Signed)
Subjective:   Patient ID: Sharon Harrell, female   DOB: 72 y.o.   MRN: [id number]   HPI Patient was here on June 14 where she sustained a closed fracture of the left great toe and also severe nail damage.  I did tell her at the time that it was possible the nail will grow back abnormally and she is now here today with abnormal nail growth with an ingrown toenail on the medial side   ROS      Objective:  Physical Exam  Neurovascular status intact with incurvation of the medial border of the left hallux secondary to structural changes to the nailbed from the trauma that occurred June 14 of last year     Assessment:  Ingrown toenail deformity left hallux secondary to abnormal structure of nail bed from previous trauma     Plan:  H&P reviewed went ahead today and recommended correction of the nail border patient wants procedure and I anesthetized the left hallux 60 mg like Marcaine mixture sterile prep done using sterile instrumentation removed the medial border exposed matrix applied phenol 3 applications 30 seconds followed by alcohol lavage sterile dressing gave instructions on soaks and patient will be seen back to recheck encouraged her to call with questions and to leave dressing on 24 hours take it off earlier if throbbing were to occur

## 2023-03-03 ENCOUNTER — Ambulatory Visit
Admission: RE | Admit: 2023-03-03 | Discharge: 2023-03-03 | Disposition: A | Discharge status: Home / Self Care | Payer: Medicare Other | Source: Ambulatory Visit | Attending: Obstetrics and Gynecology | Admitting: Obstetrics and Gynecology

## 2023-03-03 DIAGNOSIS — Z9189 Other specified personal risk factors, not elsewhere classified: Secondary | ICD-10-CM

## 2023-03-03 DIAGNOSIS — R922 Inconclusive mammogram: Secondary | ICD-10-CM | POA: Diagnosis not present

## 2023-03-03 DIAGNOSIS — Z803 Family history of malignant neoplasm of breast: Secondary | ICD-10-CM | POA: Diagnosis not present

## 2023-03-03 MED ORDER — GADOPICLENOL 0.5 MMOL/ML IV SOLN
6.0000 mL | Freq: Once | INTRAVENOUS | Status: AC | PRN
  Administered 2023-03-03: 6 mL via INTRAVENOUS

## 2023-03-06 ENCOUNTER — Encounter: Payer: Self-pay | Admitting: Podiatry

## 2023-03-06 ENCOUNTER — Ambulatory Visit (INDEPENDENT_AMBULATORY_CARE_PROVIDER_SITE_OTHER): Payer: Medicare Other | Admitting: Podiatry

## 2023-03-06 DIAGNOSIS — L600 Ingrowing nail: Secondary | ICD-10-CM

## 2023-03-06 DIAGNOSIS — B070 Plantar wart: Secondary | ICD-10-CM | POA: Diagnosis not present

## 2023-03-07 ENCOUNTER — Encounter: Payer: Self-pay | Admitting: Podiatry

## 2023-03-07 NOTE — Progress Notes (Addendum)
Subjective:   Patient ID: Sharon Harrell, female   DOB: 72 y.o.   MRN: [id number]   HPI Patient states doing good with ingrown toenail correction left big toe and concerned about lesion plantar aspect right that she wants evaluated   ROS      Objective:  Physical Exam  Neurovascular status intact left ingrown toenail medial border is healing well crusted over no erythema edema or drainage noted with a lesion subsecond metatarsal right moderately painful when pressed and has just grown recently     Assessment:  Doing well post ingrown toenail resection left with lesion right that upon debridement does show pinpoint bleeding     Plan:  For left continue conservative care but healing grade and for the right we will watch it and may require treatment in future

## 2023-03-19 ENCOUNTER — Encounter: Payer: Self-pay | Admitting: Internal Medicine

## 2023-03-20 ENCOUNTER — Telehealth: Payer: Self-pay

## 2023-03-20 NOTE — Telephone Encounter (Signed)
Per message received via MyChart, Pt NOT taking Crestor 5 mg, and wanted it removed from her Med list.    Crestor removed today, 03/20/2023 per Pt request.  Will make Pt aware of this via MyChart message.

## 2023-04-15 NOTE — Progress Notes (Unsigned)
72 y.o. G37P2002 Married White or Caucasian Not Hispanic or Latino female here for annual exam.  H/O hysterectomy.  Patient states that she is having urgency with urination. It started ~6 months ago. She has frequency and urgency to void, voids normal amounts. Drinks decaf coffee and tea.   FH of breast cancer, she has seen Genetics and had negative genetic counseling. I have messaged the [employer] because I didn't see her risk of breast cancer, previously is was estimated over 20%.     [name] last menstrual period was 08/18/1982 (approximate).          Sexually active: No.  The current method of family planning is status post hysterectomy.    Exercising: Yes.     Walking  Smoker:  no  Health Maintenance: Pap:  unsure  History of abnormal Pap:  no Breast MRI:  03/04/23 bi-rads 1 neg  01/30/23 Mammogram and U/s benign BMD:   01/30/23 osteopenic T score -2.3 Colonoscopy: 07/14/19 f/u 10 years  TDaP:  02/25/2019 Gardasil: n/a   reports that she quit smoking about 51 years ago. Her smoking use included cigarettes. She has a 2.00 pack-year smoking history. She has never used smokeless tobacco. She reports current alcohol use. She reports that she does not use drugs. 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 She has 2 kids and 5 grand children.   Past Medical History:  Diagnosis Date   Cataract    DDD (degenerative disc disease), cervical 2006   Depression 1/06   resolved   Diverticulitis    Diverticulosis of colon 12/28/2019   Multiple large mouth colonoscopy finding   Dysphonia    Frequent PVCs    Hyperlipidemia    Hypothyroidism    Nocturnal leg cramps 06/06/2014   Non-seasonal allergic rhinitis    Osteoporosis 2017   SUI (stress urinary incontinence, female) 12/07   Vitamin D deficiency disease 11/07    Past Surgical History:  Procedure Laterality Date   BREAST EXCISIONAL BIOPSY Right 1991   benign   COMBINED HYSTERECTOMY VAGINAL W/ MMK / A&P REPAIR  1983    HAND SURGERY Left 10/2009   S-L    LEFT HEART CATH AND CORONARY ANGIOGRAPHY N/A 08/31/2019   Procedure: LEFT HEART CATH AND CORONARY ANGIOGRAPHY;  Surgeon: Sw[place], Peter M, MD;  Location: [facility];  Service: Cardiovascular;  Laterality: N/A;   Left needle biopsy     LIPOSUCTION     TONSILLECTOMY  as child   WISDOM TOOTH EXTRACTION      Current Outpatient Medications  Medication Sig Dispense Refill   Calcium Carbonate (CALCIUM 600 PO) Take by mouth. 1200 mg     Cholecalciferol (VITAMIN D-3) 5000 UNITS TABS Take 5,000 Units by mouth daily.      CVS MAGNESIUM CITRATE PO Take 250 mg by mouth daily.      Cyanocobalamin (B-12) 5000 MCG SUBL Place under the tongue. Taking twice per month.     famotidine (PEPCID) 20 MG tablet Take 20 mg by mouth at bedtime. PRN     fluticasone (FLONASE) 50 MCG/ACT nasal spray Place 1 spray into both nostrils daily as needed for allergies.      levothyroxine (SYNTHROID) 75 MCG tablet Take 1 tablet (75 mcg total) by mouth daily. 90 tablet 3   metoprolol succinate (TOPROL-XL) 25 MG 24 hr tablet Take 1 tablet by mouth once daily 90 tablet 3   metroNIDAZOLE (METROCREAM) 0.75 % cream Apply 1 application topically daily as needed (rosacea).  mometasone (ELOCON) 0.1 % cream Apply 1 Application topically daily as needed (itchy ears). 45 g 3   Multiple Vitamins-Minerals (HAIR SKIN AND NAILS FORMULA PO) Take by mouth.     olopatadine (PATANOL) 0.1 % ophthalmic solution Place 1 drop into both eyes 2 (two) times daily as needed for allergies.      No current facility-administered medications for this visit.    Family History  Problem Relation Age of Onset   Cervical cancer Mother 32   Osteoarthritis Mother    Dementia Mother    Breast cancer Mother        dx early 3s; bilateral   Endometriosis Sister    Heart disease Brother    Stroke Brother    Throat cancer Maternal Uncle 6   Cancer Maternal Uncle 57       unknown type; ? pancreatic   Lung  cancer Maternal Uncle 74   Stroke Maternal Grandmother    Cancer Cousin 48       tonsil; maternal female cousin   Breast cancer Cousin 60       DCIS; maternal female cousin   Colon polyps Son        one polyp; before age 61   Skin cancer Son 40       non-melanoma; shoulder    Review of Systems  All other systems reviewed and are negative.   Exam:   BP 106/68   Pulse 77   Wt 128 lb (58.1 kg)   LMP 08/18/1982 (Approximate)   SpO2 100%   BMI 20.66 kg/m   Weight change: @WEIGHTCHANGE @ Height:      Ht Readings from Last 3 Encounters:  12/20/22 5\' 6"  (1.676 m)  09/16/22 5\' 6"  (1.676 m)  10/01/21 5\' 6"  (1.676 m)    General appearance: alert, cooperative and appears stated age Head: Normocephalic, without obvious abnormality, atraumatic Neck: no adenopathy, supple, symmetrical, trachea midline and thyroid normal to inspection and palpation Lungs: clear to auscultation bilaterally Cardiovascular: regular rate and rhythm Breasts: normal appearance, no masses or tenderness Abdomen: soft, non-tender; non distended,  no masses,  no organomegaly Extremities: extremities normal, atraumatic, no cyanosis or edema Skin: Skin color, texture, turgor normal. No rashes or lesions Lymph nodes: Cervical, supraclavicular, and axillary nodes normal. No abnormal inguinal nodes palpated Neurologic: Grossly normal   Pelvic: External genitalia:  no lesions              Urethra:  normal appearing urethra with no masses, tenderness or lesions              Bartholins and Skenes: normal                 Vagina: atrophic appearing vagina with normal color and discharge, no lesions              Cervix: absent               Bimanual Exam:  Uterus:  uterus absent              Adnexa: no mass, fullness, tenderness               Rectovaginal: Confirms               Anus:  normal sphincter tone, no lesions  Carolynn Serve, CMA chaperoned for the exam.  1. Encounter for breast and pelvic  examination Discussed breast self exam Discussed calcium and vit D intake Labs with primary Colonoscopy UTD DEXA with primary  2. Urgency of urination Discussed cutting back on caffeine, option of medication - Urinalysis, Complete - Urine Culture  3. Increased risk of breast cancer +FH, negative genetic testing I have reached out to the genetic counselor about Carroll risk of breast cancer so we can determine if she needs to continue having breast MRI's.  Addendum: The Geneticist sent me a note stating her TC risk of breast cancer is now 9%, she can stop doing the breast MRI's.

## 2023-04-17 ENCOUNTER — Telehealth: Payer: Self-pay | Admitting: Obstetrics and Gynecology

## 2023-04-17 ENCOUNTER — Encounter: Payer: Self-pay | Admitting: Obstetrics and Gynecology

## 2023-04-17 ENCOUNTER — Encounter: Payer: Self-pay | Admitting: Genetic Counselor

## 2023-04-17 ENCOUNTER — Ambulatory Visit (INDEPENDENT_AMBULATORY_CARE_PROVIDER_SITE_OTHER): Payer: Medicare Other | Admitting: Obstetrics and Gynecology

## 2023-04-17 VITALS — BP 106/68 | HR 77 | Wt 128.0 lb

## 2023-04-17 DIAGNOSIS — R3915 Urgency of urination: Secondary | ICD-10-CM | POA: Diagnosis not present

## 2023-04-17 DIAGNOSIS — Z9189 Other specified personal risk factors, not elsewhere classified: Secondary | ICD-10-CM

## 2023-04-17 DIAGNOSIS — Z01419 Encounter for gynecological examination (general) (routine) without abnormal findings: Secondary | ICD-10-CM

## 2023-04-17 DIAGNOSIS — R202 Paresthesia of skin: Secondary | ICD-10-CM | POA: Insufficient documentation

## 2023-04-17 LAB — URINALYSIS, COMPLETE
Bacteria, UA: NONE SEEN /HPF
Bilirubin Urine: NEGATIVE
Casts: NONE SEEN /LPF
Crystals: NONE SEEN /HPF
Glucose, UA: NEGATIVE
Hgb urine dipstick: NEGATIVE
Hyaline Cast: NONE SEEN /LPF
Ketones, ur: NEGATIVE
Leukocytes,Ua: NEGATIVE
Nitrite: NEGATIVE
Protein, ur: NEGATIVE
RBC / HPF: NONE SEEN /HPF (ref 0–2)
Specific Gravity, Urine: 1.015 (ref 1.001–1.035)
WBC, UA: NONE SEEN /HPF (ref 0–5)
Yeast: NONE SEEN /HPF
pH: 5.5 (ref 5.0–8.0)

## 2023-04-17 NOTE — Patient Instructions (Signed)

## 2023-04-17 NOTE — Telephone Encounter (Signed)
Mychart message sent, recalculated TC breast cancer risk is now 9%. No further MRI's are indicated.

## 2023-04-17 NOTE — Progress Notes (Signed)
Updated Tyrer Cuzick score calculated.  Her estimated lifetime risk for breast cancer is 9% (not 'high risk' per NCCN).

## 2023-04-18 LAB — URINE CULTURE
MICRO NUMBER:: 15020086
Result:: NO GROWTH
SPECIMEN QUALITY:: ADEQUATE

## 2023-07-10 ENCOUNTER — Other Ambulatory Visit: Payer: Self-pay

## 2023-07-10 ENCOUNTER — Encounter (HOSPITAL_BASED_OUTPATIENT_CLINIC_OR_DEPARTMENT_OTHER): Payer: Self-pay

## 2023-07-10 ENCOUNTER — Emergency Department (HOSPITAL_BASED_OUTPATIENT_CLINIC_OR_DEPARTMENT_OTHER)
Admission: EM | Admit: 2023-07-10 | Discharge: 2023-07-10 | Disposition: A | Discharge status: Home / Self Care | Payer: Medicare Other | Attending: Emergency Medicine | Admitting: Emergency Medicine

## 2023-07-10 ENCOUNTER — Emergency Department (HOSPITAL_BASED_OUTPATIENT_CLINIC_OR_DEPARTMENT_OTHER): Payer: Medicare Other | Admitting: Radiology

## 2023-07-10 ENCOUNTER — Telehealth: Payer: Self-pay | Admitting: Family Medicine

## 2023-07-10 DIAGNOSIS — Z1152 Encounter for screening for COVID-19: Secondary | ICD-10-CM | POA: Insufficient documentation

## 2023-07-10 DIAGNOSIS — R0602 Shortness of breath: Secondary | ICD-10-CM | POA: Diagnosis not present

## 2023-07-10 DIAGNOSIS — R0789 Other chest pain: Secondary | ICD-10-CM | POA: Insufficient documentation

## 2023-07-10 DIAGNOSIS — R079 Chest pain, unspecified: Secondary | ICD-10-CM | POA: Diagnosis not present

## 2023-07-10 LAB — CBC WITH DIFFERENTIAL/PLATELET
Abs Immature Granulocytes: 0.01 10*3/uL (ref 0.00–0.07)
Basophils Absolute: 0 10*3/uL (ref 0.0–0.1)
Basophils Relative: 0 %
Eosinophils Absolute: 0.1 10*3/uL (ref 0.0–0.5)
Eosinophils Relative: 1 %
HCT: 40.1 % (ref 36.0–46.0)
Hemoglobin: 14 g/dL (ref 12.0–15.0)
Immature Granulocytes: 0 %
Lymphocytes Relative: 30 %
Lymphs Abs: 1.4 10*3/uL (ref 0.7–4.0)
MCH: 31 pg (ref 26.0–34.0)
MCHC: 34.9 g/dL (ref 30.0–36.0)
MCV: 88.7 fL (ref 80.0–100.0)
Monocytes Absolute: 0.4 10*3/uL (ref 0.1–1.0)
Monocytes Relative: 8 %
Neutro Abs: 2.8 10*3/uL (ref 1.7–7.7)
Neutrophils Relative %: 61 %
Platelets: 216 10*3/uL (ref 150–400)
RBC: 4.52 MIL/uL (ref 3.87–5.11)
RDW: 12.2 % (ref 11.5–15.5)
WBC: 4.6 10*3/uL (ref 4.0–10.5)
nRBC: 0 % (ref 0.0–0.2)

## 2023-07-10 LAB — RESP PANEL BY RT-PCR (RSV, FLU A&B, COVID)  RVPGX2
Influenza A by PCR: NEGATIVE
Influenza B by PCR: NEGATIVE
Resp Syncytial Virus by PCR: NEGATIVE
SARS Coronavirus 2 by RT PCR: NEGATIVE

## 2023-07-10 LAB — BASIC METABOLIC PANEL
Anion gap: 7 (ref 5–15)
BUN: 20 mg/dL (ref 8–23)
CO2: 29 mmol/L (ref 22–32)
Calcium: 9.7 mg/dL (ref 8.9–10.3)
Chloride: 101 mmol/L (ref 98–111)
Creatinine, Ser: 0.78 mg/dL (ref 0.44–1.00)
GFR, Estimated: >60 mL/min (ref >60)
Glucose, Bld: 77 mg/dL (ref 70–99)
Potassium: 3.8 mmol/L (ref 3.5–5.1)
Sodium: 137 mmol/L (ref 135–145)

## 2023-07-10 LAB — BRAIN NATRIURETIC PEPTIDE: B Natriuretic Peptide: 94.3 pg/mL (ref 0.0–100.0)

## 2023-07-10 LAB — TROPONIN I (HIGH SENSITIVITY): Troponin I (High Sensitivity): 3 ng/L (ref <18)

## 2023-07-10 LAB — D-DIMER, QUANTITATIVE: D-Dimer, Quant: 0.27 ug{FEU}/mL (ref 0.00–0.50)

## 2023-07-10 NOTE — ED Notes (Signed)
Discharge paperwork given and verbally understood. 

## 2023-07-10 NOTE — Telephone Encounter (Signed)
Noted, thank you

## 2023-07-10 NOTE — Telephone Encounter (Signed)
FYI: This call has been transferred to triage nurse: the Triage Nurse. Once the result note has been entered staff can address the message at that time.  Patient called in with the following symptoms:  Red Word: Chest pressure and burning, shortness of breath, can't catch breath at times, breathing is different, possible wheezing , cough   Please advise at Mobile [phone] (mobile)  Message is routed to Provider Pool.

## 2023-07-10 NOTE — ED Provider Notes (Signed)
[place] EMERGENCY DEPARTMENT AT [facility] Provider Note   CSN: [id number] Arrival date & time: 07/10/23  1157     History  Chief Complaint  Patient presents with   Shortness of Breath   Chest Pain    Sharon Harrell is a 72 y.o. female.  Patient here with some intermittent burning and pressure in her chest and upper back.  Maybe for 1 month.  Recent cardiac workup was unremarkable.  History of PVCs.  She denies any recent surgery or travel.  No blood clot history.  No cancer history.  Not on estrogen.  Takes metoprolol for PVCs.  Denies any abdominal pain, nausea vomiting diarrhea.  No syncope.  Nothing makes it worse or better.  Does take medicine for acid reflux.  The history is provided by the patient.       Home Medications Prior to Admission medications   Medication Sig Start Date End Date Taking? Authorizing Provider  Calcium Carbonate (CALCIUM 600 PO) Take by mouth. 1200 mg    [provider]  Cholecalciferol (VITAMIN D-3) 5000 UNITS TABS Take 5,000 Units by mouth daily.     [provider]  CVS MAGNESIUM CITRATE PO Take 250 mg by mouth daily.     [provider]  Cyanocobalamin (B-12) 5000 MCG SUBL Place under the tongue. Taking twice per month.    [provider]  famotidine (PEPCID) 20 MG tablet Take 20 mg by mouth at bedtime. PRN    [provider]  fluticasone (FLONASE) 50 MCG/ACT nasal spray Place 1 spray into both nostrils daily as needed for allergies.     [provider]  levothyroxine (SYNTHROID) 75 MCG tablet Take 1 tablet (75 mcg total) by mouth daily. 09/16/22   Willow Ora, MD  metoprolol succinate (TOPROL-XL) 25 MG 24 hr tablet Take 1 tablet by mouth once daily 01/06/23   Marinus Maw, MD  metroNIDAZOLE (METROCREAM) 0.75 % cream Apply 1 application topically daily as needed (rosacea).     [provider]  mometasone (ELOCON) 0.1 % cream Apply 1 Application topically daily as  needed (itchy ears). 09/16/22   Willow Ora, MD  Multiple Vitamins-Minerals (HAIR SKIN AND NAILS FORMULA PO) Take by mouth.    [provider]  olopatadine (PATANOL) 0.1 % ophthalmic solution Place 1 drop into both eyes 2 (two) times daily as needed for allergies.  09/16/16   [provider]      Allergies    Patient has no known allergies.    Review of Systems   Review of Systems  Physical Exam Updated Vital Signs BP (!) 102/59   Pulse (!) 56   Temp 97.9 F (36.6 C)   Resp 15   LMP 08/18/1982 (Approximate)   SpO2 97%  Physical Exam Vitals and nursing note reviewed.  Constitutional:      General: She is not in acute distress.    Appearance: She is well-developed.  HENT:     Head: Normocephalic and atraumatic.  Eyes:     Extraocular Movements: Extraocular movements intact.     Conjunctiva/sclera: Conjunctivae normal.     Pupils: Pupils are equal, round, and reactive to light.  Cardiovascular:     Rate and Rhythm: Regular rhythm.     Pulses: Normal pulses.     Heart sounds: Normal heart sounds. No murmur heard. Pulmonary:     Effort: Pulmonary effort is normal. No respiratory distress.     Breath sounds: Normal breath sounds.  No decreased breath sounds, wheezing or rhonchi.  Abdominal:     Palpations: Abdomen is soft.     Tenderness: There is no abdominal tenderness.  Musculoskeletal:        General: No swelling. Normal range of motion.     Cervical back: Normal range of motion and neck supple.     Right lower leg: No edema.     Left lower leg: No edema.  Skin:    General: Skin is warm and dry.     Capillary Refill: Capillary refill takes less than 2 seconds.  Neurological:     General: No focal deficit present.     Mental Status: She is alert.  Psychiatric:        Mood and Affect: Mood normal.     ED Results / Procedures / Treatments   Labs (all labs ordered are listed, but only abnormal results are displayed) Labs Reviewed  RESP PANEL  BY RT-PCR (RSV, FLU A&B, COVID)  [id number]  CBC WITH DIFFERENTIAL/PLATELET  BASIC METABOLIC PANEL  D-DIMER, QUANTITATIVE  BRAIN NATRIURETIC PEPTIDE  TROPONIN I (HIGH SENSITIVITY)    EKG EKG Interpretation Date/Time:  Thursday July 10 2023 12:09:02 EDT Ventricular Rate:  63 PR Interval:  126 QRS Duration:  90 QT Interval:  422 QTC Calculation: 431 R Axis:   14  Text Interpretation: Sinus rhythm with occasional Premature ventricular complexes Anterior infarct , age undetermined Abnormal ECG When compared with ECG of 21-Apr-2021 08:12, PREVIOUS ECG IS PRESENT Confirmed by Virgina Norfolk [phone]) on 07/10/2023 12:31:59 PM  Radiology DG Chest 2 View  Result Date: 07/10/2023 CLINICAL DATA:  Chest pain and shortness of breath EXAM: CHEST - 2 VIEW COMPARISON:  X-ray 12/20/2022 FINDINGS: The heart size and mediastinal contours are within normal limits. No consolidation, pneumothorax or effusion. No edema. The visualized skeletal structures are unremarkable. IMPRESSION: No acute cardiopulmonary disease. Electronically Signed   By: Karen Kays M.D.   On: 07/10/2023 13:19    Procedures Procedures    Medications Ordered in ED Medications - No data to display  ED Course/ Medical Decision Making/ A&P                                 Medical Decision Making Amount and/or Complexity of Data Reviewed Labs: ordered. Radiology: ordered.   Sharon Harrell is here with chest pain shortness of breath.  Symptoms on and off for few weeks.  History of acid reflux, diverticulitis, PVCs.  Unremarkable vitals.  No fever.  EKG shows sinus bradycardia.  No ischemic changes.  She had heart catheterization 4 years ago that showed normal coronaries.  She had coronary CT about 6 months ago that was unremarkable per my review and interpretation.  Overall differential is likely MSK or GI related discomfort but will evaluate for ACS, PE, electrolyte abnormality.  Overall CBC, BMP, D-dimer, troponin, chest x-ray  ordered.  Patient very well-appearing.  Overall troponin normal.  D-dimer normal.  I reviewed interpreted the labs.  No significant anemia, electrolyte abnormality kidney injury or leukocytosis.  Chest x-ray per my review and interpretation shows no evidence of pneumonia pneumothorax.  Overall she is very well-appearing.  I have no concern for cardiac process or pulmonary process.  Recommend follow-up with primary care doctor to discuss may be some further optimization of GI medications.  Some rest and hydration.  Overall I do not think there is an emergent process.  Her vascular  study on the CT coronary study was unremarkable and I have no concern for dissection or other acute process.  She understands return precautions.  Discharged in good condition.  This chart was dictated using voice recognition software.  Despite best efforts to proofread,  errors can occur which can change the documentation meaning.         Final Clinical Impression(s) / ED Diagnoses Final diagnoses:  Atypical chest pain    Rx / DC Orders ED Discharge Orders     None         Virgina Norfolk, DO 07/10/23 1420

## 2023-07-10 NOTE — Telephone Encounter (Signed)
Following patient declining to go to the ED, Sharon Harrell attempted to gain advice on issue from Dr. Ruthine Dose but she was unavailable. Due to this, Sharon Harrell transferred patient over to me. I was able to reason with patient about going to the ED and she agreed. I recommended [facility] ED to her and she agreed to comply. States her husband is taking her.

## 2023-07-10 NOTE — Telephone Encounter (Signed)
Please see triage note and advise.  ?

## 2023-07-10 NOTE — Telephone Encounter (Signed)
In addition to Havlyn's note, I did speak with Dr. Jon Billings who agreed with Triage outcome prior to transfer to [facility].

## 2023-07-10 NOTE — Telephone Encounter (Signed)
Final Disposition: Go to ED NOW   Patient Name First: Sharon Last: Harrell Gender: Female DOB: Apr 07, 1951 Age: 72 Y 3 M 9 D Return Phone Number: [phone] (Primary) Address[place] [address] Client [facility] - Administrator, [facility] at [facility] Provider Asencion Partridge- MD Contact Type Call Who Is Calling Patient / Member / Family / Caregiver Call Type Triage / Clinical Caller Name Patrica Spratt Relationship To Patient Self Return Phone Number [phone] (Primary) Chief Complaint CHEST PAIN - pain, pressure, heaviness or tightness Reason for Call Symptomatic / Request for Health Information Initial Comment Caller states they have chest pressure and burning. They also have shortness of breath, wheezing, and cough. Translation No Nurse Assessment Nurse: Henri Medal, RN, Amy Date/Time (Eastern Time): 07/10/2023 8:58:03 AM Confirm and document reason for call. If symptomatic, describe symptoms. ---Caller states she is having chest pain that is really hard to describe. She had a total workup in Feb. on her heart, front & back. No blockages. Sees a dr for electrical side. Dx with frequent PVC's- on Metoprolol. Found 2 spots on upper lobes of lungs. Had all kinds of breath tests. Had to chase dr. down to get every report. Not going back to him. She thinks the pain is coming from the lungs. It burns very high where the lungs start, straight across chest at the clavicle. She feels it in the front & back. Then it also burns at the bottom of the chest/ribs where the lungs end, in front of chest & back of chest in same spot, like it is going straight through. It is not the whole chest, just in the upper area & lower area. When she has this, she will do an EKG with her Apple Watch. Results are always inconclusive. When not having it, it says no A-fib detected. The burning seems to be triggered  whenever she engages her arms, like when she is pulling the empty trash cans up her drive. Before all this started, she had a bit of a cough. Has had it for about 2 months. The last month she has started getting SOB with exertion. She used to not make any noise when SOB, but in the last month, it sounds like she is wheezing when SOB. Started Prilosec a month ago. On 8/13 her watch said her HR was below 40bpm for   Nurse Assessment 10 minutes. This was at 9:08 AM & she did not feel anything odd. HR right now is 71. Does the patient have any new or worsening symptoms? ---Yes Will a triage be completed? ---Yes Related visit to physician within the last 2 weeks? ---No Does the PT have any chronic conditions? (i.e. diabetes, asthma, this includes High risk factors for pregnancy, etc.) ---Yes List chronic conditions. ---Hypothyroidism, PVC's Is this a behavioral health or substance abuse call? ---No Guidelines Guideline Title Affirmed Question Affirmed Notes Nurse Date/Time Lamount Cohen Time) Chest Pain [1] Chest pain lasts > 5 minutes AND [2] occurred in past 3 days (72 hours) (Exception: Feels exactly the same as previously diagnosed heartburn and has accompanying sour taste in mouth.) Lovelace, RN, Amy 07/10/2023 9:04:25 AM Disp. Time Lamount Cohen Time) Disposition Final User 07/10/2023 8:52:54 AM Send to Urgent Queue Talmadge Coventry 07/10/2023 9:15:54 AM Go to ED Now (or PCP triage) Yes Lovelace, RN, Amy Final Disposition 07/10/2023 9:15:54 AM Go to ED Now (or PCP triage) Yes Lovelace, RN, Amy Caller Disagree/Comply Disagree Caller Understands Yes  PreDisposition InappropriateToAsk Care Advice Given Per Guideline GO TO ED NOW (OR PCP TRIAGE): * IF NO PCP (PRIMARY CARE PROVIDER) SECOND-LEVEL TRIAGE: You need to be seen within the next hour. Go to the ED/UCC at _____________ Hospital. Leave as soon as you can. CARE ADVICE given per Chest Pain (Adult) guideline.  Comments User:  Lutricia Horsfall, RN Date/Time Lamount Cohen Time): 07/10/2023 9:25:31 AM Patient stated she did not call for Sx. She was calling back because she got a message about a F/U appt. that needs rescheduled, but she did allow me to triage. Referrals REFERRED TO PCP OFFICE

## 2023-07-10 NOTE — ED Triage Notes (Addendum)
Pt c/o CP, "intermittent burning & a little pressure/ tightness upper chest w radiation to back, associated SHOB x72mo." Pt denies anticoagulation States she just had a full workup in Feb Hx PVCs, states there is "nothing wrong w the heart muscle."

## 2023-07-11 ENCOUNTER — Encounter: Payer: Self-pay | Admitting: Internal Medicine

## 2023-07-17 ENCOUNTER — Ambulatory Visit: Payer: Medicare Other | Admitting: Family Medicine

## 2023-07-22 ENCOUNTER — Telehealth: Payer: Self-pay

## 2023-07-22 NOTE — Telephone Encounter (Signed)
Transition Care Management Unsuccessful Follow-up Telephone Call  Date of discharge and from where:  [facility] 8/22  Attempts:  2nd Attempt  Reason for unsuccessful TCM follow-up call:  No answer/busy   Sharon Harrell [place]  [facility], [facility] Guide, Phone: [phone] Website: [email or web address]

## 2023-07-22 NOTE — Telephone Encounter (Signed)
Transition Care Management Unsuccessful Follow-up Telephone Call  Date of discharge and from where:  [facility] 8/22  Attempts:  1st Attempt  Reason for unsuccessful TCM follow-up call:  No answer/busy   Lenard Forth [place]  [facility], [facility] Guide, Phone: [phone] Website: [email or web address]

## 2023-07-29 NOTE — Telephone Encounter (Signed)
I would be happy to see her.  Please put her in a consult slot, new patient for RB.  Does not need to be a blocked slot or nodule slot.  Use my next available opening for standard pulmonary consult.  Thank you very much

## 2023-08-14 ENCOUNTER — Telehealth: Payer: Self-pay | Admitting: Internal Medicine

## 2023-08-14 NOTE — Telephone Encounter (Signed)
Spoke with Pt. Pt HR has been dropping into the 40/50s for short periods of time per her watch. Pt denies symptoms other than low energy.  Pt said she does not feel bad and feels "normal" during other times. Pt had a ED visit on 8/22 and was referred to Dr Delton Coombes. Told Pt Dr Ladona Ridgel was not in the office for a few weeks so I could see if I could get her in with a APP in our office or she could try her PCP. Pt stated she would try her PCP.

## 2023-08-14 NOTE — Telephone Encounter (Signed)
STAT if HR is under 50 or over 120 (normal HR is 60-100 beats per minute)  What is your heart rate?   Mid 50's  Do you have a log of your heart rate readings (document readings)?   No  Do you have any other symptoms?   No energy  Patient stated she has be having lower HR reading more often recently and is concerned that she has been having no energy.

## 2023-08-18 ENCOUNTER — Encounter: Payer: Self-pay | Admitting: Family Medicine

## 2023-08-18 ENCOUNTER — Ambulatory Visit (INDEPENDENT_AMBULATORY_CARE_PROVIDER_SITE_OTHER): Payer: Medicare Other | Admitting: Family Medicine

## 2023-08-18 VITALS — BP 128/64 | HR 36 | Temp 97.8°F | Ht 66.0 in | Wt 122.6 lb

## 2023-08-18 DIAGNOSIS — R0600 Dyspnea, unspecified: Secondary | ICD-10-CM | POA: Diagnosis not present

## 2023-08-18 DIAGNOSIS — I493 Ventricular premature depolarization: Secondary | ICD-10-CM | POA: Diagnosis not present

## 2023-08-18 DIAGNOSIS — E039 Hypothyroidism, unspecified: Secondary | ICD-10-CM

## 2023-08-18 DIAGNOSIS — R001 Bradycardia, unspecified: Secondary | ICD-10-CM

## 2023-08-18 DIAGNOSIS — T50905A Adverse effect of unspecified drugs, medicaments and biological substances, initial encounter: Secondary | ICD-10-CM | POA: Diagnosis not present

## 2023-08-18 LAB — TSH: TSH: 1.78 u[IU]/mL (ref 0.35–5.50)

## 2023-08-18 MED ORDER — METOPROLOL SUCCINATE ER 25 MG PO TB24: 12.5000 mg | ORAL_TABLET | Freq: Every day | ORAL | Status: DC | PRN

## 2023-08-18 NOTE — Progress Notes (Signed)
Subjective  CC:  Chief Complaint  Patient presents with   Hypothyroidism   Hyperlipidemia   lack of energy    Pt states that her pulse gets to under 40 for the past couple of months, pt feels very tired.     HPI: Sharon Harrell is a 72 y.o. female who presents to the office today to address the problems listed above in the chief complaint. 72 year old female with history of frequent PVCs on Toprol-XL 25 daily, recent history of shortness of breath that has been worsening, chest and upper abdominal burning: She had a cardiology evaluation in February which consisted of a exercise treadmill test that showed a possible ST depression and follow-up coronary calcium score was normal.  Cardiology was not concerned of ischemia.  She did have a normal cardiac cath 4 years ago.  She also had an ER visit in August for similar symptoms.  Evaluation was very reassuring at that time as well.  However, today she is in because she has noted that her heart rate is in the low 30s and 40s at times.  Her husband checks her pulse and started she.  She does not have symptoms except for feeling quite sluggish recently.  She is on a chronic H2 blocker twice daily for history of LPR and reflux.  She last saw GI doctor 4 years ago, Dr. Loreta Ave.  She denies pleuritic chest pain or positional symptoms.  She denies orthopnea or lightheadedness.  No syncopal episodes. I reviewed recent cardiology evaluations including calcium coronary scoring, ETT results, EKGs dating back to 2020.  I reviewed recent lab work.  I reviewed PFTs which were normal except for a diffusing capacity mismatch.  Assessment  1. Bradycardia, drug induced   2. Frequent PVCs   3. Dyspnea, unspecified type   4. Acquired hypothyroidism      Plan  Bradycardia and history of frequent PVCs on beta-blocker: Beta-blocker likely contributing.  Question if pulse rate are being collected correctly given multiple PVCs.  EKG shows heart rate of 69 here in the  office today although I did check a heart rate which was bradycardic at times.  She is asymptomatic outside of fatigue.  Recommend decreasing Toprol-XL to 12.5 mg daily and monitoring symptom control.  May need a heart monitor to help clarify.  No ischemic changes noted today on EKG. Dyspnea: Chronic and likely multifactorial Burning sensations could be related to LPR and GERD.  Recommend follow-up with GI.  Continue Pepcid.  Patient declines PPI use Need to chek TSH, last was checked in October 2023 and was normal.  I spent a total of 46 minutes for this patient encounter. Time spent included preparation, face-to-face counseling with the patient and coordination of care, review of chart and records, and documentation of the encounter.  Follow up: As scheduled for CPE 09/24/2023  Orders Placed This Encounter  Procedures   TSH   EKG 12-Lead   No orders of the defined types were placed in this encounter.     I reviewed the patients updated PMH, FH, and SocHx.    Patient Active Problem List   Diagnosis Date Noted   Dyspnea 12/20/2022    Priority: High   At high risk for breast cancer 09/12/2021    Priority: High   Acquired hypothyroidism 12/28/2019    Priority: High   Mixed hyperlipidemia 08/31/2019    Priority: High   Frequent PVCs 10/07/2018    Priority: High   Osteoporosis 07/16/2017  Priority: High   Esophageal dysphagia 07/01/2019    Priority: Medium    Gastroesophageal reflux disease 07/01/2019    Priority: Medium    Chronic idiopathic constipation 07/01/2019    Priority: Medium    Family history of breast cancer 02/21/2018    Priority: Medium    Laryngopharyngeal reflux (LPR) 07/16/2017    Priority: Medium    Notalgia paresthetica 04/17/2023    Priority: Low   Primary osteoarthritis of right hand 04/20/2020    Priority: Low   Allergic conjunctivitis 12/28/2019    Priority: Low   Diverticulosis of colon 12/28/2019    Priority: Low   Rosacea 12/28/2019     Priority: Low   Chronic allergic rhinitis 02/21/2018    Priority: Low   Hepatic cyst 02/21/2018    Priority: Low   Nocturnal leg cramps 06/06/2014    Priority: Low   Genetic testing 08/14/2021   Urgency incontinence 04/17/2021   Vitreous degeneration of both eyes 02/18/2019   No outpatient medications have been marked as taking for the 08/18/23 encounter (Office Visit) with Willow Ora, MD.    Allergies: Patient has No Known Allergies. Family History: Patient family history includes Breast cancer in her mother; Breast cancer (age of onset: 53) in her cousin; Cancer (age of onset: 96) in her cousin; Cancer (age of onset: 61) in her maternal uncle; Cervical cancer (age of onset: 23) in her mother; Colon polyps in her son; Dementia in her mother; Endometriosis in her sister; Heart disease in her brother; Lung cancer (age of onset: 50) in her maternal uncle; Osteoarthritis in her mother; Skin cancer (age of onset: 43) in her son; Stroke in her brother and maternal grandmother; Throat cancer (age of onset: 25) in her maternal uncle. Social History:  Patient  reports that she quit smoking about 51 years ago. Her smoking use included cigarettes. She started smoking about 59 years ago. She has a 2 pack-year smoking history. She has never used smokeless tobacco. She reports current alcohol use. She reports that she does not use drugs.  Review of Systems: Constitutional: Negative for fever malaise or anorexia Cardiovascular: negative for chest pain Respiratory: negative for SOB or persistent cough Gastrointestinal: negative for abdominal pain  Objective  Vitals: BP 128/64   Pulse (!) 36   Temp 97.8 F (36.6 C)   Ht 5\' 6"  (1.676 m)   Wt 122 lb 9.6 oz (55.6 kg)   LMP 08/18/1982 (Approximate)   SpO2 97%   BMI 19.79 kg/m  General: no acute distress , A&Ox3, appears well HEENT: PEERL, conjunctiva normal, neck is supple Cardiovascular:  RRR with frequent PVCs, at times bradycardic, no  murmur Respiratory:  Good breath sounds bilaterally, CTAB with normal respiratory effort Skin:  Warm, no rashes  EKG: Heart rate 59, similar to past EKGs.  No ischemic changes  Commons side effects, risks, benefits, and alternatives for medications and treatment plan prescribed today were discussed, and the patient expressed understanding of the given instructions. Patient is instructed to call or message via MyChart if he/she has any questions or concerns regarding our treatment plan. No barriers to understanding were identified. We discussed Red Flag symptoms and signs in detail. Patient expressed understanding regarding what to do in case of urgent or emergency type symptoms.  Medication list was reconciled, printed and provided to the patient in AVS. Patient instructions and summary information was reviewed with the patient as documented in the AVS. This note was prepared with assistance of Dragon voice recognition  software. Occasional wrong-word or sound-a-like substitutions may have occurred due to the inherent limitations of voice recognition software

## 2023-08-25 NOTE — Progress Notes (Signed)
See mychart note Dear Ms. Gancarz, Your thyroid level is perfect.  Sincerely, Dr. Mardelle Matte

## 2023-09-24 ENCOUNTER — Encounter: Payer: Self-pay | Admitting: Family Medicine

## 2023-09-24 ENCOUNTER — Ambulatory Visit: Payer: Medicare Other | Admitting: Family Medicine

## 2023-09-24 ENCOUNTER — Other Ambulatory Visit: Payer: Self-pay | Admitting: Family Medicine

## 2023-09-24 ENCOUNTER — Ambulatory Visit: Payer: Medicare Other | Attending: Family Medicine

## 2023-09-24 VITALS — BP 100/56 | HR 77 | Temp 98.2°F | Ht 66.0 in | Wt 123.4 lb

## 2023-09-24 DIAGNOSIS — I493 Ventricular premature depolarization: Secondary | ICD-10-CM

## 2023-09-24 DIAGNOSIS — K219 Gastro-esophageal reflux disease without esophagitis: Secondary | ICD-10-CM

## 2023-09-24 DIAGNOSIS — M810 Age-related osteoporosis without current pathological fracture: Secondary | ICD-10-CM

## 2023-09-24 DIAGNOSIS — E782 Mixed hyperlipidemia: Secondary | ICD-10-CM

## 2023-09-24 DIAGNOSIS — E559 Vitamin D deficiency, unspecified: Secondary | ICD-10-CM

## 2023-09-24 DIAGNOSIS — E039 Hypothyroidism, unspecified: Secondary | ICD-10-CM | POA: Diagnosis not present

## 2023-09-24 MED ORDER — LEVOTHYROXINE SODIUM 75 MCG PO TABS: 75.0000 ug | ORAL_TABLET | Freq: Every day | ORAL | 3 refills | Status: DC

## 2023-09-24 NOTE — Progress Notes (Unsigned)
Enrolled for [employer] to mail a ZIO XT long term holter monitor to the patients address on file.   EP to read.

## 2023-09-24 NOTE — Patient Instructions (Signed)
Please return in 12 months for your annual complete physical; please come fasting.     If you have any questions or concerns, please don't hesitate to send me a message via MyChart or call the office at [phone]. Thank you for visiting with [place] today! It's our pleasure caring for you.   VISIT SUMMARY:  During today's visit, we discussed your ongoing episodes of palpitations and fatigue, as well as your intermittent upper abdominal discomfort. We reviewed your history of thyroid disease and current management, and we also addressed your chronic constipation. Your overall health status appears stable, but we have made some adjustments to your treatment plan to better manage your symptoms.  YOUR PLAN:  -BRADYCARDIA: Bradycardia is a condition where your heart rate is slower than normal. We will order a heart monitor to better understand your heart rate patterns and have discontinued metoprolol. Please follow up with your cardiologist in February.  -GASTROESOPHAGEAL REFLUX DISEASE (GERD): GERD is a condition where stomach acid frequently flows back into the tube connecting your mouth and stomach, causing discomfort. Continue taking famotidine 20 mg daily, and if your symptoms persist, follow up with a GI doctor.  -HYPOTHYROIDISM: Hypothyroidism is a condition where your thyroid gland doesn't produce enough thyroid hormone. Your thyroid function is stable, and we will refill your thyroid medication.  -CONSTIPATION: Constipation is a condition where you have infrequent or difficult bowel movements. Continue managing with stool softeners and Metamucil as needed.  -GENERAL HEALTH MAINTENANCE: Your general health maintenance includes taking 5000 IU of vitamin D and 1200 mg of calcium daily. Your fasting glucose and triglycerides are normal. Please schedule a lab appointment for fasting labs.  INSTRUCTIONS:  Please schedule a follow-up appointment with your cardiologist in February and with your  pulmonologist in December. Additionally, schedule a lab appointment for fasting labs.

## 2023-09-24 NOTE — Progress Notes (Signed)
Subjective  Chief Complaint  Patient presents with   Annual Exam    Pt here for Annual Exam and is not currently fasting    Hypothyroidism   Hyperlipidemia    HPI: Sharon Harrell is a 72 y.o. female who presents to [facility] [place] today for a Female Wellness Visit. She also has the concerns and/or needs as listed above in the chief complaint. These will be addressed in addition to the Health Maintenance Visit.   Wellness Visit: annual visit with health maintenance review and exam  HM: screens are all current. Declines the flu vaccine today. Feeling well overall.  Chronic disease f/u and/or acute problem visit: (deemed necessary to be done in addition to the wellness visit): History of Present Illness The patient, with a history of frequent pvcs and recently bradycardia, presents with persistent episodes of palpitations and fatigue. They report a sensation of strong heartbeats and a tired, heavy feeling during these episodes. Despite discontinuing metoprolol for two weeks, the patient continues to experience these symptoms. They note that their heart rate, as measured by a personal device, frequently drops into the 40s and high 30s. she will see the pulmonologist for eval in december.   In addition to their cardiac concerns, the patient reports intermittent discomfort in the upper abdomen, described as a stinging, burning sensation with a feeling of constriction. This symptom has been present for approximately six months and does not appear to be associated with any specific triggers or positions. GERD is well controlled on famotidine 20mg  daily. chronic constipation is stable   osteopenia: I personally reviewed dexa w/ osteopenia T =-2.3 on calcium 600 and vit D 5000 IU daily  The patient also mentions a history of thyroid disease, which is currently managed with medication. They report no significant changes in their thyroid-related symptoms.  The patient's overall  health status appears stable, although they express concern about potential changes in their cardiac condition.  Assessment  1. Mixed hyperlipidemia   2. Frequent PVCs   3. Acquired hypothyroidism   4. Laryngopharyngeal reflux (LPR)   5. Gastroesophageal reflux disease without esophagitis   6. Age-related osteoporosis without current pathological fracture   7. Vitamin D deficiency      Plan  Female Wellness Visit: Age appropriate Health Maintenance and Prevention measures were discussed with patient. Included topics are cancer screening recommendations, ways to keep healthy (see AVS) including dietary and exercise recommendations, regular eye and dental care, use of seat belts, and avoidance of moderate alcohol use and tobacco use.  BMI: discussed patient's BMI and encouraged positive lifestyle modifications to help get to or maintain a target BMI. HM needs and immunizations were addressed and ordered. See below for orders. See HM and immunization section for updates. Routine labs and screening tests ordered including cmp, cbc and lipids where appropriate. Discussed recommendations regarding Vit D and calcium supplementation (see AVS)  Chronic disease management visit and/or acute problem visit: Assessment & Plan Bradycardia Persistent low heart rate (40s, occasionally high 30s) despite discontinuation of metoprolol two weeks ago. Symptoms include palpitations and fatigue. Differential diagnosis includes tachy-brady syndrome. Discussed accuracy of heart rate measurements from the watch and potential need for a heart monitor to differentiate between actual bradycardia and missed beats due to PVCs. Evaluated potential for increased PVCs off metoprolol and need for alternative medication. - Order heart monitor - Discontinue metoprolol - Follow up with cardiologist in February, sooner if sxs worsen  Gastroesophageal Reflux Disease (GERD) Intermittent  epigastric pain for six months,  non-positional, occurring at various times including when lying down. Managed with famotidine 20 mg daily. No current reflux symptoms. Discussed potential gastric etiology and need for GI follow-up if symptoms persist. - Continue famotidine 20 mg daily - Follow up with GI doctor if symptoms persist  Hypothyroidism Thyroid function stable as of September. No new symptoms. Current medication regimen includes thyroid medication. - Refill thyroid medication  Constipation Chronic issue with small, hard stools. Managed with stool softeners and Metamucil as needed. - Continue current management with stool softeners and Metamucil as needed  General Health Maintenance Takes 5000 IU of vitamin D and 1200 mg of calcium daily. No issues with fasting glucose or triglycerides. - Schedule lab appointment for fasting labs,  Follow-up: 12 mo for cpe - Schedule follow-up appointment with cardiologist in February - Schedule follow-up appointment with pulmonologist in December.  Orders Placed This Encounter  Procedures   CBC with Differential/Platelet   Comprehensive metabolic panel   Lipid panel   VITAMIN D 25 Hydroxy (Vit-D Deficiency, Fractures)   Holter monitor - 48 hour   Meds ordered this encounter  Medications   levothyroxine (SYNTHROID) 75 MCG tablet    Sig: Take 1 tablet (75 mcg total) by mouth daily.    Dispense:  90 tablet    Refill:  3      Body mass index is 19.92 kg/m. Wt Readings from Last 3 Encounters:  09/24/23 123 lb 6.4 oz (56 kg)  08/18/23 122 lb 9.6 oz (55.6 kg)  04/17/23 128 lb (58.1 kg)     Patient Active Problem List   Diagnosis Date Noted Date Diagnosed   Dyspnea 12/20/2022     Priority: High    12/2022: dyspnea eval: false positive ETT then low risk Cardiac calcium score: Coronary calcium score is 2, which places the patient in the 41st percentile for age and sex matched control. Crestor 5 started for primary prevention PFTs were ok.  Dr. [place]    At high  risk for breast cancer 09/12/2021     Priority: High   Acquired hypothyroidism 12/28/2019     Priority: High   Mixed hyperlipidemia 08/31/2019     Priority: High   Frequent PVCs 10/07/2018     Priority: High    Normal cardiac cath and nl EF 08/2019, Dr. [place] 2024: dyspnea eval: false positive ETT then low risk Cardiac calcium score: Coronary calcium score is 2, which places the patient in the 41st percentile for age and sex matched control. Crestor 5 started for primary prevention PFTs were ok.     Osteoporosis 07/16/2017     Priority: High    Dr. Oscar La: 2017: T = -2.5, started meds: f/u 2019 improved T = - 2.1 on fosamax. F/u dexa 2021: T - -2.2, stopped fosamax after years Dexa 2024: lowest T right fem neck -2.3. stable. Continue same. No RX meds recommended.     Esophageal dysphagia 07/01/2019     Priority: Medium     Followed by  Dr. Loreta Ave last seen 06/08/19 EGD ordered.  EGD preformed on [id number] impression: normal appearing widely patent esophagus and GEJ-Z line was measured at 40 cm. Mild antral gastritis. Normal examined duodenum. No specimens collected.     Gastroesophageal reflux disease 07/01/2019     Priority: Medium     Followed by  Dr. Loreta Ave last seen 06/08/19    Chronic idiopathic constipation 07/01/2019     Priority: Medium     Followed by  Dr. Loreta Ave last seen 06/08/19    Family history of breast cancer 02/21/2018     Priority: Medium     Lifetime risk is greater than 20%. She has history of a benign surgical excisional biopsy of the right breast in 1991.    Laryngopharyngeal reflux (LPR) 07/16/2017     Priority: Medium    Notalgia paresthetica 04/17/2023     Priority: Low   Primary osteoarthritis of right hand 04/20/2020     Priority: Low   Allergic conjunctivitis 12/28/2019     Priority: Low   Diverticulosis of colon 12/28/2019     Priority: Low    Multiple large mouth colonoscopy finding    Rosacea 12/28/2019     Priority: Low   Chronic allergic  rhinitis 02/21/2018     Priority: Low   Hepatic cyst 02/21/2018     Priority: Low   Nocturnal leg cramps 06/06/2014     Priority: Low   Genetic testing 08/14/2021     Negative hereditary cancer genetic testing: no pathogenic variants detected in [facility] Multi-Cancer +RNA Panel. Variants of uncertain significance in PMS2 at c.1510G>C (p.Glu504Gln).  The report date is August 13, 2021.   The Multi-Cancer + RNA Panel offered by [facility] includes sequencing and/or deletion/duplication analysis of the following 84 genes:  AIP*, ALK, APC*, ATM*, AXIN2*, BAP1*, BARD1*, BLM*, BMPR1A*, BRCA1*, BRCA2*, BRIP1*, CASR, CDC73*, CDH1*, CDK4, CDKN1B*, CDKN1C*, CDKN2A, CEBPA, CHEK2*, CTNNA1*, DICER1*, DIS3L2*, EGFR, EPCAM, FH*, FLCN*, GATA2*, GPC3, GREM1, HOXB13, HRAS, KIT, MAX*, MEN1*, MET, MITF, MLH1*, MSH2*, MSH3*, MSH6*, MUTYH*, NBN*, NF1*, NF2*, NTHL1*, PALB2*, PDGFRA, PHOX2B, PMS2*, POLD1*, POLE*, POT1*, PRKAR1A*, PTCH1*, PTEN*, RAD50*, RAD51C*, RAD51D*, RB1*, RECQL4, RET, RUNX1*, SDHA*, SDHAF2*, SDHB*, SDHC*, SDHD*, SMAD4*, SMARCA4*, SMARCB1*, SMARCE1*, STK11*, SUFU*, TERC, TERT, TMEM127*, Tp53*, TSC1*, TSC2*, VHL*, WRN*, and WT1.  RNA analysis is performed for * genes.     Urgency incontinence 04/17/2021    Vitreous degeneration of both eyes 02/18/2019    Health Maintenance  Topic Date Due   Medicare Annual Wellness (AWV)  02/08/2022   INFLUENZA VACCINE  02/16/2024 (Originally 06/19/2023)   MAMMOGRAM  03/02/2024   DEXA SCAN  01/29/2025   DTaP/Tdap/Td (2 - Tdap) 02/24/2029   Colonoscopy  07/13/2029   Pneumonia Vaccine 46+ Years old  Completed   Hepatitis C Screening  Completed   Zoster Vaccines- Shingrix  Completed   HPV VACCINES  Aged Out   COVID-19 Vaccine  Discontinued   Immunization History  Administered Date(s) Administered   Influenza, High Dose Seasonal PF 08/22/2017, 08/31/2018, 09/08/2019   Influenza-Unspecified 08/22/2017   PFIZER(Purple Top)SARS-COV-2 Vaccination 01/29/2020,  02/23/2020   Pneumococcal Conjugate-13 11/02/2018   Pneumococcal Polysaccharide-23 12/28/2019   Pneumococcal-Unspecified 10/18/2020   Td 02/25/2019   Zoster Recombinant(Shingrix) 10/16/2020, 02/15/2021   Zoster, Live 04/18/2012   We updated and reviewed the patient's past history in detail and it is documented below. Allergies: Patient has No Known Allergies. Past Medical History Patient  has a past medical history of Cataract, DDD (degenerative disc disease), cervical (2006), Depression (11/2004), Diverticulosis of colon (12/28/2019), Dysphonia, Frequent PVCs, Hyperlipidemia, Hypothyroidism, Nocturnal leg cramps (06/06/2014), Non-seasonal allergic rhinitis, Osteoporosis (2017), SUI (stress urinary incontinence, female) (10/2006), and Vitamin D deficiency disease (09/2006). Past Surgical History Patient  has a past surgical history that includes Hand surgery (Left, 10/2009); Combined hysterectomy vaginal w/ MMK / A&P repair (1610); Tonsillectomy (as child); Wisdom tooth extraction; Liposuction; LEFT HEART CATH AND CORONARY ANGIOGRAPHY (N/A, 08/31/2019); Breast excisional biopsy (Right, 1991); and Left needle biopsy. Family History: Patient  family history includes Breast cancer in her mother; Breast cancer (age of onset: 64) in her cousin; Cancer (age of onset: 37) in her cousin; Cancer (age of onset: 44) in her maternal uncle; Cervical cancer (age of onset: 47) in her mother; Colon polyps in her son; Dementia in her mother; Endometriosis in her sister; Heart disease in her brother; Lung cancer (age of onset: 34) in her maternal uncle; Osteoarthritis in her mother; Skin cancer (age of onset: 8) in her son; Stroke in her brother and maternal grandmother; Throat cancer (age of onset: 70) in her maternal uncle. Social History:  Patient  reports that she quit smoking about 51 years ago. Her smoking use included cigarettes. She started smoking about 59 years ago. She has a 2 pack-year smoking history.  She has never used smokeless tobacco. She reports current alcohol use. She reports that she does not use drugs.  Review of Systems: Constitutional: negative for fever or malaise Ophthalmic: negative for photophobia, double vision or loss of vision Cardiovascular: negative for chest pain, dyspnea on exertion, or new LE swelling Respiratory: negative for SOB or persistent cough Gastrointestinal: negative for abdominal pain, change in bowel habits or melena Genitourinary: negative for dysuria or gross hematuria, no abnormal uterine bleeding or disharge Musculoskeletal: negative for new gait disturbance or muscular weakness Integumentary: negative for new or persistent rashes, no breast lumps Neurological: negative for TIA or stroke symptoms Psychiatric: negative for SI or delusions Allergic/Immunologic: negative for hives  Patient Care Team    Relationship Specialty Notifications Start End  Willow Ora, MD PCP - General Family Medicine  12/28/19   [facility] Consulting Physician Otolaryngology  07/16/17   Holly Bodily (Inactive) Physician Assistant Hematology and Oncology  09/25/17   Charna Elizabeth, MD Consulting Physician Gastroenterology  07/01/19   Marinus Maw, MD Consulting Physician Cardiology  12/28/19   Romualdo Bolk, MD (Inactive) Consulting Physician Obstetrics and Gynecology  12/28/19   Bufford Buttner, MD Consulting Physician Dermatology  12/28/19   York Spaniel, MD (Inactive) Consulting Physician Neurology  12/28/19     Objective  Vitals: BP (!) 100/56   Pulse 77   Temp 98.2 F (36.8 C)   Ht 5\' 6"  (1.676 m)   Wt 123 lb 6.4 oz (56 kg)   LMP 08/18/1982 (Approximate)   SpO2 97%   BMI 19.92 kg/m  General:  Well developed, well nourished, no acute distress  Psych:  Alert and orientedx3,normal mood and affect HEENT:  Normocephalic, atraumatic, non-icteric sclera,  supple neck without adenopathy, mass or  thyromegaly Cardiovascular:  Normal S1, S2, RRR without gallop, rub or murmur, + ectopic beats Respiratory:  Good breath sounds bilaterally, CTAB with normal respiratory effort Gastrointestinal: normal bowel sounds, soft, non-tender, no noted masses. No HSM MSK: extremities without edema, joints without erythema or swelling Neurologic:    Mental status is normal.  Gross motor and sensory exams are normal.  No tremor  Commons side effects, risks, benefits, and alternatives for medications and treatment plan prescribed today were discussed, and the patient expressed understanding of the given instructions. Patient is instructed to call or message via MyChart if he/she has any questions or concerns regarding our treatment plan. No barriers to understanding were identified. We discussed Red Flag symptoms and signs in detail. Patient expressed understanding regarding what to do in case of urgent or emergency type symptoms.  Medication list was reconciled, printed and provided to the patient in AVS. Patient  instructions and summary information was reviewed with the patient as documented in the AVS. This note was prepared with assistance of Dragon voice recognition software. Occasional wrong-word or sound-a-like substitutions may have occurred due to the inherent limitations of voice recognition software

## 2023-09-25 ENCOUNTER — Ambulatory Visit: Payer: Medicare Other | Admitting: Podiatry

## 2023-09-25 ENCOUNTER — Encounter: Payer: Self-pay | Admitting: Podiatry

## 2023-09-25 VITALS — Ht 66.0 in | Wt 123.4 lb

## 2023-09-25 DIAGNOSIS — L600 Ingrowing nail: Secondary | ICD-10-CM | POA: Diagnosis not present

## 2023-09-25 NOTE — Patient Instructions (Signed)

## 2023-09-26 NOTE — Progress Notes (Addendum)
Subjective:   Patient ID: Sharon Harrell, female   DOB: 72 y.o.   MRN: [id number]   HPI Patient states she had an ingrown done previously and she is concerned about the nail structural and also has developed a small area on the medial side that is irritated and catches socks.  This is all a result of damage to the nail structure from injury sustained on April 29, 2022   ROS      Objective:  Physical Exam  Neurovascular status intact there is a small spicule on the medial side of the left hallux that is sharp and slightly tender but localized with the rest of the nail showing slight crack line but within normal parameters     Assessment:  Ingrown toenail deformity of the left hallux spicule formation with cell migration     Plan:  H&P reviewed and were going to go ahead and fix this permanently I did do block anesthetic of the left hallux sterile prep was done remove spicule clean the area out applied chemical to kill any remaining cells advised on soaks reappoint as needed

## 2023-09-28 DIAGNOSIS — I493 Ventricular premature depolarization: Secondary | ICD-10-CM

## 2023-10-03 ENCOUNTER — Other Ambulatory Visit (INDEPENDENT_AMBULATORY_CARE_PROVIDER_SITE_OTHER): Payer: Medicare Other

## 2023-10-03 DIAGNOSIS — E782 Mixed hyperlipidemia: Secondary | ICD-10-CM | POA: Diagnosis not present

## 2023-10-03 DIAGNOSIS — E559 Vitamin D deficiency, unspecified: Secondary | ICD-10-CM | POA: Diagnosis not present

## 2023-10-03 DIAGNOSIS — K219 Gastro-esophageal reflux disease without esophagitis: Secondary | ICD-10-CM | POA: Diagnosis not present

## 2023-10-03 LAB — LIPID PANEL
Cholesterol: 215 mg/dL — ABNORMAL HIGH (ref 0–200)
HDL: 70.9 mg/dL (ref >39.00)
LDL Cholesterol: 126 mg/dL — ABNORMAL HIGH (ref 0–99)
NonHDL: 143.95
Total CHOL/HDL Ratio: 3
Triglycerides: 89 mg/dL (ref 0.0–149.0)
VLDL: 17.8 mg/dL (ref 0.0–40.0)

## 2023-10-03 LAB — CBC WITH DIFFERENTIAL/PLATELET
Basophils Absolute: 0 10*3/uL (ref 0.0–0.1)
Basophils Relative: 0.5 % (ref 0.0–3.0)
Eosinophils Absolute: 0.1 10*3/uL (ref 0.0–0.7)
Eosinophils Relative: 1.7 % (ref 0.0–5.0)
HCT: 42.8 % (ref 36.0–46.0)
Hemoglobin: 14.6 g/dL (ref 12.0–15.0)
Lymphocytes Relative: 26.5 % (ref 12.0–46.0)
Lymphs Abs: 1.3 10*3/uL (ref 0.7–4.0)
MCHC: 34.2 g/dL (ref 30.0–36.0)
MCV: 91.3 fL (ref 78.0–100.0)
Monocytes Absolute: 0.4 10*3/uL (ref 0.1–1.0)
Monocytes Relative: 7.5 % (ref 3.0–12.0)
Neutro Abs: 3.1 10*3/uL (ref 1.4–7.7)
Neutrophils Relative %: 63.8 % (ref 43.0–77.0)
Platelets: 229 10*3/uL (ref 150.0–400.0)
RBC: 4.69 Mil/uL (ref 3.87–5.11)
RDW: 12.6 % (ref 11.5–15.5)
WBC: 4.9 10*3/uL (ref 4.0–10.5)

## 2023-10-03 LAB — COMPREHENSIVE METABOLIC PANEL
ALT: 15 U/L (ref 0–35)
AST: 18 U/L (ref 0–37)
Albumin: 4.2 g/dL (ref 3.5–5.2)
Alkaline Phosphatase: 75 U/L (ref 39–117)
BUN: 16 mg/dL (ref 6–23)
CO2: 29 meq/L (ref 19–32)
Calcium: 9.1 mg/dL (ref 8.4–10.5)
Chloride: 102 meq/L (ref 96–112)
Creatinine, Ser: 0.89 mg/dL (ref 0.40–1.20)
GFR: 65.19 mL/min (ref >60.00)
Glucose, Bld: 89 mg/dL (ref 70–99)
Potassium: 4.3 meq/L (ref 3.5–5.1)
Sodium: 138 meq/L (ref 135–145)
Total Bilirubin: 0.7 mg/dL (ref 0.2–1.2)
Total Protein: 6.5 g/dL (ref 6.0–8.3)

## 2023-10-03 LAB — VITAMIN D 25 HYDROXY (VIT D DEFICIENCY, FRACTURES): VITD: 88.92 ng/mL (ref 30.00–100.00)

## 2023-10-08 DIAGNOSIS — I493 Ventricular premature depolarization: Secondary | ICD-10-CM | POA: Diagnosis not present

## 2023-10-09 NOTE — Progress Notes (Signed)
Labs reviewed.  The 10-year ASCVD risk score (Arnett DK, et al., 2019) is: 6.5%   Values used to calculate the score:     Age: 72 years     Sex: Female     Is Non-Hispanic African American: No     Diabetic: No     Tobacco smoker: No     Systolic Blood Pressure: 100 mmHg     Is BP treated: No     HDL Cholesterol: 70.9 mg/dL     Total Cholesterol: 215 mg/dL  Dear Sharon Harrell, Thank you for allowing me to care for you at your recent office visit.  I wanted to let you know that I have reviewed your lab test results and am happy to report that they look good.  Your cholesterol levels have improved. There are no electrolyte abnormalities and your kidney, liver and blood counts are normal. Your vitamin D level is high normal. No changes are needed at this time.   Sincerely, Dr. Mardelle Matte

## 2023-10-29 ENCOUNTER — Ambulatory Visit: Payer: Medicare Other | Admitting: Emergency Medicine

## 2023-10-29 ENCOUNTER — Encounter: Payer: Self-pay | Admitting: Emergency Medicine

## 2023-10-29 VITALS — BP 132/68 | HR 44 | Temp 98.2°F | Ht 66.0 in | Wt 123.0 lb

## 2023-10-29 DIAGNOSIS — R0602 Shortness of breath: Secondary | ICD-10-CM

## 2023-10-29 DIAGNOSIS — I493 Ventricular premature depolarization: Secondary | ICD-10-CM

## 2023-10-29 NOTE — Progress Notes (Signed)
Subjective:    Patient ID: Sharon Harrell, female    DOB: 10/23/1951, 72 y.o.   MRN: [id number]  HPI  The patient is a 72 year old individual with a history of hyperthyroidism, hyperlipidemia, allergic rhinitis, diverticulosis, and depression. She presents with complaints of shortness of breath and chest discomfort, which have been ongoing since at least the summer of 2023. The patient has a history of intermittent bradycardia and premature ventricular contractions (PVCs), which were diagnosed a few years ago and have been managed with metoprolol.  The patient reports a change in her symptoms about a year and a half ago, with increased awareness of PVCs and difficulty achieving deep breaths, particularly at night. She describes a sensation of heat or burning in the chest, extending from the front to the back, which is more noticeable during physical activity such as walking. The patient has noticed a decrease in her walking speed and endurance, which she attributes to these symptoms.  The patient also reports a sensation of soreness or burning under the rib on the left side, which is intermittent and not associated with any particular triggers. She has been taking famotidine daily for an unspecified period, but does not report any symptoms of gastroesophageal reflux disease (GERD).  The patient has a history of COVID-19 infection, with the most recent episode occurring about a year ago. She notes that the onset of PVCs coincided with her recovery from COVID-19. Pulmonary function tests performed in February of the current year showed normal airflows and possible mild restriction, with slightly decreased diffusion capacity that corrected to the normal range when adjusted for alveolar volume. A coronary CT scan performed later that month showed a coronary calcium score of 2 with no extracardiac findings.    RADIOLOGY Coronary CT: Coronary calcium score of 2. No extra cardiac findings.  (01/14/2023) Chest x-ray: Normal (06/2023)  DIAGNOSTIC Pulmonary function testing: Normal air flows without bronchodilator response. FEV1 2.61 L (106% predicted). Lung volume suggested possible restriction with decreased RV but normal TLC. Diffusion capacity slightly decreased but corrected to normal range when adjusted for alveolar volume. Subtle curve to flow volume loop suggesting mild obstruction. (01/03/2023)  Review of Systems As per HPI  Past Medical History:  Diagnosis Date   Cataract    DDD (degenerative disc disease), cervical 2006   Depression 11/2004   resolved   Diverticulosis of colon 12/28/2019   Multiple large mouth colonoscopy finding   Dysphonia    Frequent PVCs    Hyperlipidemia    Hypothyroidism    Nocturnal leg cramps 06/06/2014   Non-seasonal allergic rhinitis    Osteoporosis 2017   SUI (stress urinary incontinence, female) 10/2006   Vitamin D deficiency disease 09/2006     Family History  Problem Relation Age of Onset   Cervical cancer Mother 60   Osteoarthritis Mother    Dementia Mother    Breast cancer Mother        dx early 16s; bilateral   Endometriosis Sister    Heart disease Brother    Stroke Brother    Throat cancer Maternal Uncle 55   Cancer Maternal Uncle 66       unknown type; ? pancreatic   Lung cancer Maternal Uncle 79   Stroke Maternal Grandmother    Cancer Cousin 20       tonsil; maternal female cousin   Breast cancer Cousin 35       DCIS; maternal female cousin   Colon polyps Son  one polyp; before age 53   Skin cancer Son 36       non-melanoma; shoulder     Social History   Socioeconomic History   Marital status: Married    Spouse name: Not on file   Number of children: 2   Years of education: college-1   Highest education level: Not on file  Occupational History   Occupation: Retired     Associate Professor: [employer]  Tobacco Use   Smoking status: Former    Current packs/day: 0.00    Average packs/day:  0.3 packs/day for 8.0 years (2.0 ttl pk-yrs)    Types: Cigarettes    Start date: 11/19/1963    Quit date: 11/19/1971    Years since quitting: 51.9   Smokeless tobacco: Never  Vaping Use   Vaping status: Never Used  Substance and Sexual Activity   Alcohol use: Yes    Comment: socially   Drug use: No   Sexual activity: Not Currently    Partners: Male    Birth control/protection: Surgical, Post-menopausal    Comment: hysterectomy  Other Topics Concern   Not on file  Social History Narrative   Recently retired Feb 2021   Social Determinants of Health   Financial Resource Strain: Low Risk  (02/08/2021)   Overall Financial Resource Strain (CARDIA)    Difficulty of Paying Living Expenses: Not hard at all  Food Insecurity: No Food Insecurity (02/08/2021)   Hunger Vital Sign    Worried About Running Out of Food in the Last Year: Never true    Ran Out of Food in the Last Year: Never true  Transportation Needs: No Transportation Needs (02/08/2021)   PRAPARE - Administrator, Civil Service (Medical): No    Lack of Transportation (Non-Medical): No  Physical Activity: Sufficiently Active (02/08/2021)   Exercise Vital Sign    Days of Exercise per Week: 7 days    Minutes of Exercise per Session: 50 min  Stress: Stress Concern Present (02/08/2021)   [employer] of Occupational Health - Occupational Stress Questionnaire    Feeling of Stress : To some extent  Social Connections: Moderately Isolated (02/08/2021)   Social Connection and Isolation Panel [NHANES]    Frequency of Communication with Friends and Family: More than three times a week    Frequency of Social Gatherings with Friends and Family: More than three times a week    Attends Religious Services: Never    Database administrator or Organizations: No    Attends Banker Meetings: Never    Marital Status: Married  Catering manager Violence: Not At Risk (02/08/2021)   Humiliation, Afraid, Rape, and Kick  questionnaire    Fear of Current or Ex-Partner: No    Emotionally Abused: No    Physically Abused: No    Sexually Abused: No     No Known Allergies   Outpatient Medications Prior to Visit  Medication Sig Dispense Refill   Calcium Carbonate (CALCIUM 600 PO) Take by mouth. 1200 mg     Cholecalciferol (VITAMIN D-3) 5000 UNITS TABS Take 5,000 Units by mouth daily.      [employer]S MAGNESIUM CITRATE PO Take 250 mg by mouth daily.      Cyanocobalamin (B-12) 5000 MCG SUBL Place under the tongue. Taking twice per month.     famotidine (PEPCID) 20 MG tablet Take 20 mg by mouth at bedtime. PRN     fluticasone (FLONASE) 50 MCG/ACT nasal spray Place 1 spray into both  nostrils daily as needed for allergies.      levothyroxine (SYNTHROID) 75 MCG tablet Take 1 tablet (75 mcg total) by mouth daily. 90 tablet 3   metroNIDAZOLE (METROCREAM) 0.75 % cream Apply 1 application topically daily as needed (rosacea).      mometasone (ELOCON) 0.1 % cream Apply 1 Application topically daily as needed (itchy ears). 45 g 3   olopatadine (PATANOL) 0.1 % ophthalmic solution Place 1 drop into both eyes 2 (two) times daily as needed for allergies.      No facility-administered medications prior to visit.        Objective:   Physical Exam  Vitals:   10/29/23 1532  BP: 132/68  Pulse: (!) 44  Temp: 98.2 F (36.8 C)  TempSrc: Oral  SpO2: 97%  Weight: 123 lb (55.8 kg)  Height: 5\' 6"  (1.676 m)   Gen: Pleasant, well-nourished, in no distress,  normal affect  ENT: No lesions,  mouth clear,  oropharynx clear, no postnasal drip  Neck: No JVD, no stridor  Lungs: No use of accessory muscles, no crackles or wheezing on normal respiration, no wheeze on forced expiration  Cardiovascular: RRR, heart sounds normal, no murmur or gallops, no peripheral edema  Musculoskeletal: No deformities, no cyanosis or clubbing  Neuro: alert, awake, non focal  Skin: Warm, no lesions or rash      Assessment & Plan:   Dyspnea Shortness of Breath and Chest Discomfort Patient reports a change in functional capacity, difficulty with deep breaths, and a burning sensation in the chest. Previous pulmonary function testing showed normal airflows with possible restriction and subtle evidence of obstruction on flow-volume loop. Patient has a history of PVCs and is on Metoprolol. No clear history of active GERD although the burning sensation could be reflux related. -Order a cardiopulmonary exercise test to assess for any cardiac or pulmonary limitations on functional capacity. -Order a dedicated CT scan of the chest to assess for any evidence of abnormality or post-inflammatory changes, possibly related to previous COVID-19 infections. -consider empiric PPI going forward depending on w/u  Follow-up Review results of cardiopulmonary exercise test and CT scan to determine next steps in management.  Frequent PVCs Premature Ventricular Contractions (PVCs) Patient reports increased awareness of PVCs and has been on Metoprolol 25mg  in past. No recent exacerbations reported. -as per Dr Lubertha Basque plans   Levy Pupa, MD, PhD 10/29/2023, 4:57 PM [facility] [phone] or if no answer before 7:00PM call [phone] For any issues after 7:00PM please call eLink [phone]

## 2023-10-29 NOTE — Assessment & Plan Note (Addendum)
Premature Ventricular Contractions (PVCs) Patient reports increased awareness of PVCs and has been on Metoprolol 25mg  in past. No recent exacerbations reported. -as per Dr Lubertha Basque plans

## 2023-10-29 NOTE — Patient Instructions (Signed)
We reviewed your pulmonary function testing today.  This shows grossly normal airflows although I believe you would benefit from further evaluation with cardiopulmonary exercise testing.  We will arrange for this today. We will arrange for a high-resolution CT scan of the chest Please follow-up with Dr. Delton Coombes next available after these test so we can review those results together.

## 2023-10-29 NOTE — Assessment & Plan Note (Addendum)
Shortness of Breath and Chest Discomfort Patient reports a change in functional capacity, difficulty with deep breaths, and a burning sensation in the chest. Previous pulmonary function testing showed normal airflows with possible restriction and subtle evidence of obstruction on flow-volume loop. Patient has a history of PVCs and is on Metoprolol. No clear history of active GERD although the burning sensation could be reflux related. -Order a cardiopulmonary exercise test to assess for any cardiac or pulmonary limitations on functional capacity. -Order a dedicated CT scan of the chest to assess for any evidence of abnormality or post-inflammatory changes, possibly related to previous COVID-19 infections. -consider empiric PPI going forward depending on w/u  Follow-up Review results of cardiopulmonary exercise test and CT scan to determine next steps in management.

## 2023-11-03 ENCOUNTER — Telehealth: Payer: Self-pay | Admitting: Emergency Medicine

## 2023-11-04 ENCOUNTER — Ambulatory Visit: Payer: Medicare Other | Admitting: Internal Medicine

## 2023-11-04 NOTE — Telephone Encounter (Signed)
Lmam for cpx to be scheduled [phone]

## 2023-11-05 NOTE — Telephone Encounter (Signed)
Spoke to the patient to let them know they are on a wait list patient is willing to go outside the system to do the test

## 2023-11-07 NOTE — Telephone Encounter (Signed)
Ok, thank you. Is there something that triage needs to do?

## 2023-11-20 ENCOUNTER — Telehealth: Payer: Self-pay | Admitting: Emergency Medicine

## 2023-11-20 NOTE — Telephone Encounter (Signed)
 Patient needs a cadiopulmonary test to be scheduled. Call back number [phone]

## 2023-11-20 NOTE — Telephone Encounter (Signed)
 Ok this message never made it back to the pcc box somehow but we would need to know where to try and send her to

## 2023-11-20 NOTE — Telephone Encounter (Signed)
 Message was taken on 12/20 and sent to triage I will close this message since its a duplicate

## 2023-11-24 ENCOUNTER — Ambulatory Visit (HOSPITAL_COMMUNITY)
Admission: RE | Admit: 2023-11-24 | Discharge: 2023-11-24 | Disposition: A | Discharge status: Home / Self Care | Payer: Medicare Other | Source: Ambulatory Visit | Attending: Emergency Medicine | Admitting: Emergency Medicine

## 2023-11-24 DIAGNOSIS — R0602 Shortness of breath: Secondary | ICD-10-CM | POA: Diagnosis not present

## 2023-11-25 NOTE — Telephone Encounter (Signed)
 Patient has been placed on wait list to have pulmonary exercise test done. She is willing to have done outside of [place]. PCC's would like to know where you would like patient to go?

## 2023-11-25 NOTE — Telephone Encounter (Signed)
 I do not know if  does CPST, but if so we could try there.   I've never referred anyone outside the system for this, but the closest would be [facility], so I would try there to see if they can do sooner.

## 2023-11-26 NOTE — Telephone Encounter (Signed)
 I just spoke with Sharon Harrell with scheduling she is checking to see if this is something that can be done at [facility]

## 2023-11-26 NOTE — Telephone Encounter (Signed)
 Barbara with scheduling just called back after she spoke Glendale Chard over respiratory here at [facility]. Tammy stated they do not do the CPX here

## 2023-11-26 NOTE — Telephone Encounter (Signed)
 Thank you for trying [facility]  Is there anywhere else outside of [place] that she could have this done?

## 2023-11-26 NOTE — Telephone Encounter (Signed)
 PT will like to go to [facility]   Fax: [phone]  Attn: Scheduling Dept   Needs Demographics and Order faxed over to [facility]

## 2023-12-01 ENCOUNTER — Encounter: Payer: Self-pay | Admitting: Emergency Medicine

## 2023-12-02 NOTE — Telephone Encounter (Signed)
 Order has been faxed

## 2023-12-09 ENCOUNTER — Other Ambulatory Visit: Payer: Self-pay | Admitting: Family Medicine

## 2023-12-09 MED ORDER — LEVOTHYROXINE SODIUM 75 MCG PO TABS: 75.0000 ug | ORAL_TABLET | Freq: Every day | ORAL | 3 refills | Status: DC

## 2023-12-09 NOTE — Telephone Encounter (Signed)
Copied from CRM [id number]. Topic: Clinical - Medication Refill >> Dec 09, 2023  9:32 AM Irine Seal wrote: Most Recent Primary Care Visit:  Provider: [facility]  Department: [facility]  Visit Type: LAB  Date: 10/03/2023  Medication: levothyroxine (SYNTHROID) 75 MCG tablet [[id number]]  Has the patient contacted their pharmacy? [facility] pharmacy technician is who called on behalf of the pt  (Agent: If no, request that the patient contact the pharmacy for the refill. If patient does not wish to contact the pharmacy document the reason why and proceed with request.) (Agent: If yes, when and what did the pharmacy advise?)  Is this the correct pharmacy for this prescription? No If no, delete pharmacy and type the correct one.  This is the patient's preferred pharmacy:  [facility]   Has the prescription been filled recently? Yes  Is the patient out of the medication? Yes  Has the patient been seen for an appointment in the last year OR does the patient have an upcoming appointment? Yes  Can we respond through MyChart? Yes  Agent: Please be advised that Rx refills may take up to 3 business days. We ask that you follow-up with your pharmacy.

## 2023-12-11 NOTE — Telephone Encounter (Signed)
Good morning, can we please figure out other locations for this patient to get a Cardiopulmonary exercise test done.

## 2023-12-12 NOTE — Telephone Encounter (Signed)
Pt is aware that the Referral was sent. Will confirm on Monday that the referral was sent and received.

## 2023-12-12 NOTE — Telephone Encounter (Signed)
PT now calling and wonder if she can be referred to [facility]. Even though it is out of network. She knows they are conducted there. Her # is [phone] She is very frustrated over how long it is taking to get a response. Threatens to cancel her appt coming up.

## 2023-12-12 NOTE — Telephone Encounter (Signed)
I checked with [facility] and they will accept this referral. Once the referral has been sent over and confirmed, I'll contact the pt.

## 2023-12-15 NOTE — Telephone Encounter (Signed)
I talked to [facility], and they'll be calling the pt soon to set up the Cardiopulmonary Exercise Test. For the Spirometry BNI, they'll need a new order, and it'll have to be scheduled for a different day. Do you still want the patient to go ahead with the Spirometry BNI?

## 2023-12-15 NOTE — Telephone Encounter (Signed)
I'm sorry, I didn't really there were all of these barriers to getting her tested in a timely manner.  She does need spirometry pre-/post-BD (BNI). It can be done at [facility], wherever makes most sense for the patient/

## 2023-12-16 ENCOUNTER — Other Ambulatory Visit: Payer: Self-pay | Admitting: Family Medicine

## 2023-12-16 DIAGNOSIS — Z1231 Encounter for screening mammogram for malignant neoplasm of breast: Secondary | ICD-10-CM

## 2023-12-18 NOTE — Telephone Encounter (Signed)
Per Dr. Delton Coombes:  I'm sorry, I didn't really there were all of these barriers to getting her tested in a timely manner.  She does need spirometry pre-/post-BD (BNI). It can be done at [facility], wherever makes most sense for the patient/

## 2023-12-18 NOTE — Telephone Encounter (Signed)
It's okay. The patient is very pleased that the situation has been resolved. She would like to have the spirometry pre-/post-BD (BNI) performed at [facility].

## 2024-01-01 ENCOUNTER — Ambulatory Visit: Payer: Medicare Other | Admitting: Emergency Medicine

## 2024-01-01 ENCOUNTER — Telehealth: Payer: Self-pay | Admitting: Emergency Medicine

## 2024-01-01 NOTE — Telephone Encounter (Signed)
PT calling about the cardiopulmonary exercise test with pre and post spirometry per normal protocol. She was confused about the spirometry tests...she thinks. When the [facility] called her he mentioned she'd need these and it was not clear if someone was going to call her to set this testing up. Please call her to advise. TY. [phone]

## 2024-01-05 NOTE — Telephone Encounter (Signed)
PCCs, can you please advise patient on this test being set up for her?  Thank you.

## 2024-01-05 NOTE — Telephone Encounter (Signed)
If you look at the 12/01/23 phone note the pulmonary stress test was done and the patient is wanting the PFT to be done at [facility]

## 2024-01-05 NOTE — Telephone Encounter (Signed)
Patient checking on message for test. Patient phone number is [phone].

## 2024-01-05 NOTE — Telephone Encounter (Signed)
error 

## 2024-01-07 NOTE — Telephone Encounter (Signed)
Called [facility]( for information on the 2nd order that needs to be placed for the patient. Pulmonary Function Lab mentionst hat since this is a bronchdilator order then it would need to be a 2 different orders.

## 2024-01-07 NOTE — Telephone Encounter (Signed)
The number to call for Pulmonary Lab is [phone] for more information. The Cardiopulmonary Function Test's appointment for the pt is occurring in March.

## 2024-01-08 NOTE — Telephone Encounter (Signed)
Sent a message to Triage in earlier telephone encounter for the Pft that needs to be done. Provided a number to Pulmonary Lab for more info if it's needed.

## 2024-01-09 ENCOUNTER — Ambulatory Visit
Admission: RE | Admit: 2024-01-09 | Discharge: 2024-01-09 | Disposition: A | Discharge status: Home / Self Care | Payer: Medicare Other | Source: Ambulatory Visit

## 2024-01-09 DIAGNOSIS — Z1231 Encounter for screening mammogram for malignant neoplasm of breast: Secondary | ICD-10-CM | POA: Diagnosis not present

## 2024-01-29 DIAGNOSIS — R0602 Shortness of breath: Secondary | ICD-10-CM | POA: Diagnosis not present

## 2024-01-29 DIAGNOSIS — Z87891 Personal history of nicotine dependence: Secondary | ICD-10-CM | POA: Diagnosis not present

## 2024-01-30 ENCOUNTER — Encounter: Payer: Self-pay | Admitting: Emergency Medicine

## 2024-01-30 ENCOUNTER — Ambulatory Visit: Payer: Medicare Other | Admitting: Emergency Medicine

## 2024-01-30 VITALS — BP 98/60 | HR 54 | Ht 65.0 in | Wt 124.8 lb

## 2024-01-30 DIAGNOSIS — R0600 Dyspnea, unspecified: Secondary | ICD-10-CM

## 2024-01-30 DIAGNOSIS — K219 Gastro-esophageal reflux disease without esophagitis: Secondary | ICD-10-CM

## 2024-01-30 DIAGNOSIS — Z87891 Personal history of nicotine dependence: Secondary | ICD-10-CM

## 2024-01-30 NOTE — Progress Notes (Signed)
 Subjective:    Patient ID: Sharon Harrell, female    DOB: 11/03/1951, 72 y.o.   MRN: [id number]  HPI  ROV 01/30/2024 --follow-up visit 73 year old woman with former minimal tobacco use whom I saw in December for chest discomfort and shortness of breath.  She had a change in her functional capacity and difficulty taking deep breaths.  I ordered a dedicated CT scan of her chest, cardiopulmonary exercise test to evaluate further.  We also discussed possibly starting empiric therapy for GERD. The burning in the chest is improved. Her functional capacity is unchanged - some SOB w hills. She notices some difficulty taking deep breaths when she lays down supine.   Pulmonary function testing 01/03/2023 reviewed by me, showed normal airflows without a bronchodilator response, normal TLC with a decreased RV suggestive of possible mild restriction, decreased diffusion capacity that corrects to the normal range when adjusted for alveolar volume, overall normal flow-volume loop.  High-resolution CT scan of the chest 11/24/2023 reviewed by me, shows no mediastinal adenopathy, some very subtle biapical pleural-parenchymal scarring with a 5 mm left upper lobe nodule.  No evidence of reticulation, honeycomb change.  Mild dependent atelectasis particularly lower lobes.  CPEX 01/29/24 at [facility] >> not in [place], has not yet been interpreted. She was able to exert for an extended time.     Review of Systems As per HPI  Past Medical History:  Diagnosis Date   Cataract    DDD (degenerative disc disease), cervical 2006   Depression 11/2004   resolved   Diverticulosis of colon 12/28/2019   Multiple large mouth colonoscopy finding   Dysphonia    Frequent PVCs    Hyperlipidemia    Hypothyroidism    Nocturnal leg cramps 06/06/2014   Non-seasonal allergic rhinitis    Osteoporosis 2017   SUI (stress urinary incontinence, female) 10/2006   Vitamin D deficiency disease 09/2006     Family History   Problem Relation Age of Onset   Cervical cancer Mother 79   Osteoarthritis Mother    Dementia Mother    Breast cancer Mother 42 - 16       bilateral   Endometriosis Sister    Throat cancer Maternal Uncle 37   Cancer Maternal Uncle 25       unknown type; ? pancreatic   Lung cancer Maternal Uncle 79   Stroke Maternal Grandmother    Cancer Cousin 76       tonsil; maternal female cousin   Breast cancer Cousin 63       DCIS; maternal female cousin   Heart disease Brother    Stroke Brother    Colon polyps Son        one polyp; before age 40   Skin cancer Son 40       non-melanoma; shoulder     Social History   Socioeconomic History   Marital status: Married    Spouse name: Not on file   Number of children: 2   Years of education: college-1   Highest education level: Not on file  Occupational History   Occupation: Retired     Associate Professor: [employer]  Tobacco Use   Smoking status: Former    Current packs/day: 0.00    Average packs/day: 0.3 packs/day for 8.0 years (2.0 ttl pk-yrs)    Types: Cigarettes    Start date: 11/19/1963    Quit date: 11/19/1971    Years since quitting: 52.2   Smokeless tobacco: Never  Vaping  Use   Vaping status: Never Used  Substance and Sexual Activity   Alcohol use: Yes    Comment: socially   Drug use: No   Sexual activity: Not Currently    Partners: Male    Birth control/protection: Surgical, Post-menopausal    Comment: hysterectomy  Other Topics Concern   Not on file  Social History Narrative   Recently retired Feb 2021   Social Drivers of Health   Financial Resource Strain: Low Risk  (02/08/2021)   Overall Financial Resource Strain (CARDIA)    Difficulty of Paying Living Expenses: Not hard at all  Food Insecurity: No Food Insecurity (02/08/2021)   Hunger Vital Sign    Worried About Running Out of Food in the Last Year: Never true    Ran Out of Food in the Last Year: Never true  Transportation Needs: No Transportation Needs  (02/08/2021)   PRAPARE - Administrator, Civil Service (Medical): No    Lack of Transportation (Non-Medical): No  Physical Activity: Sufficiently Active (02/08/2021)   Exercise Vital Sign    Days of Exercise per Week: 7 days    Minutes of Exercise per Session: 50 min  Stress: Stress Concern Present (02/08/2021)   [employer] of Occupational Health - Occupational Stress Questionnaire    Feeling of Stress : To some extent  Social Connections: Moderately Isolated (02/08/2021)   Social Connection and Isolation Panel [NHANES]    Frequency of Communication with Friends and Family: More than three times a week    Frequency of Social Gatherings with Friends and Family: More than three times a week    Attends Religious Services: Never    Database administrator or Organizations: No    Attends Banker Meetings: Never    Marital Status: Married  Catering manager Violence: Not At Risk (02/08/2021)   Humiliation, Afraid, Rape, and Kick questionnaire    Fear of Current or Ex-Partner: No    Emotionally Abused: No    Physically Abused: No    Sexually Abused: No     No Known Allergies   Outpatient Medications Prior to Visit  Medication Sig Dispense Refill   Calcium Carbonate (CALCIUM 600 PO) Take by mouth. 1200 mg     Cholecalciferol (VITAMIN D-3) 5000 UNITS TABS Take 5,000 Units by mouth daily.      CVS MAGNESIUM CITRATE PO Take 250 mg by mouth daily.      Cyanocobalamin (B-12) 5000 MCG SUBL Place under the tongue. Taking twice per month.     famotidine (PEPCID) 20 MG tablet Take 20 mg by mouth at bedtime. PRN     fluticasone (FLONASE) 50 MCG/ACT nasal spray Place 1 spray into both nostrils daily as needed for allergies.      levothyroxine (SYNTHROID) 75 MCG tablet Take 1 tablet (75 mcg total) by mouth daily. 90 tablet 3   metoprolol succinate (TOPROL-XL) 25 MG 24 hr tablet Take 5 mg by mouth daily.     metroNIDAZOLE (METROCREAM) 0.75 % cream Apply 1 application  topically daily as needed (rosacea).      mometasone (ELOCON) 0.1 % cream Apply 1 Application topically daily as needed (itchy ears). 45 g 3   olopatadine (PATANOL) 0.1 % ophthalmic solution Place 1 drop into both eyes 2 (two) times daily as needed for allergies.      No facility-administered medications prior to visit.        Objective:   Physical Exam  Vitals:   01/30/24 1033  BP: 98/60  Pulse: (!) 54  SpO2: 100%  Weight: 124 lb 12.8 oz (56.6 kg)  Height: 5\' 5"  (1.651 m)    Gen: Pleasant, well-nourished, in no distress,  normal affect  ENT: No lesions,  mouth clear,  oropharynx clear, no postnasal drip  Neck: No JVD, no stridor  Lungs: No use of accessory muscles, no crackles or wheezing on normal respiration, no wheeze on forced expiration  Cardiovascular: RRR, heart sounds normal, no murmur or gallops, no peripheral edema  Musculoskeletal: No deformities, no cyanosis or clubbing  Neuro: alert, awake, non focal  Skin: Warm, no lesions or rash      Assessment & Plan:  Laryngopharyngeal reflux (LPR) Chest burning has improved  Dyspnea No clear etiology with normal PFT and reassuring high-resolution CT chest.  She has some subtle apical parenchymal scarring but do not believe this is enough to manifest as symptoms.  She did undergo a cardiopulmonary exercise test yesterday at [facility].  We will review this study when available, follow-up if she has any progression of her shortness of breath or any new respiratory symptoms.  We reviewed your CT scan of the chest today.  This was a reassuring study. Your pulmonary function testing from last year were reassuring as well We will also review your cardiopulmonary exercise test from [facility] when available. We can discuss your cardiopulmonary exercise test results when available if there are any abnormalities. Please follow Dr. Delton Coombes as needed for any changes in your respiratory status.  Time spent 44 minutes  Levy Pupa, MD, PhD 02/04/2024, 7:05 PM [facility] [phone] or if no answer before 7:00PM call [phone] For any issues after 7:00PM please call eLink [phone]

## 2024-01-30 NOTE — Patient Instructions (Signed)
 We reviewed your CT scan of the chest today.  This was a reassuring study. Your pulmonary function testing from last year were reassuring as well We will also review your cardiopulmonary exercise test from [facility] when available. We can discuss your cardiopulmonary exercise test results when available if there are any abnormalities. Please follow Dr. Delton Coombes as needed for any changes in your respiratory status.

## 2024-02-04 NOTE — Assessment & Plan Note (Signed)
 Chest burning has improved

## 2024-02-04 NOTE — Assessment & Plan Note (Signed)
 No clear etiology with normal PFT and reassuring high-resolution CT chest.  She has some subtle apical parenchymal scarring but do not believe this is enough to manifest as symptoms.  She did undergo a cardiopulmonary exercise test yesterday at [facility].  We will review this study when available, follow-up if she has any progression of her shortness of breath or any new respiratory symptoms.  We reviewed your CT scan of the chest today.  This was a reassuring study. Your pulmonary function testing from last year were reassuring as well We will also review your cardiopulmonary exercise test from [facility] when available. We can discuss your cardiopulmonary exercise test results when available if there are any abnormalities. Please follow Dr. Delton Coombes as needed for any changes in your respiratory status.

## 2024-02-05 DIAGNOSIS — R0602 Shortness of breath: Secondary | ICD-10-CM | POA: Diagnosis not present

## 2024-02-06 ENCOUNTER — Encounter: Payer: Self-pay | Admitting: Emergency Medicine

## 2024-02-10 DIAGNOSIS — D229 Melanocytic nevi, unspecified: Secondary | ICD-10-CM | POA: Diagnosis not present

## 2024-02-10 DIAGNOSIS — L578 Other skin changes due to chronic exposure to nonionizing radiation: Secondary | ICD-10-CM | POA: Diagnosis not present

## 2024-02-10 DIAGNOSIS — D1801 Hemangioma of skin and subcutaneous tissue: Secondary | ICD-10-CM | POA: Diagnosis not present

## 2024-02-10 DIAGNOSIS — L814 Other melanin hyperpigmentation: Secondary | ICD-10-CM | POA: Diagnosis not present

## 2024-02-10 DIAGNOSIS — Z411 Encounter for cosmetic surgery: Secondary | ICD-10-CM | POA: Diagnosis not present

## 2024-02-10 DIAGNOSIS — L821 Other seborrheic keratosis: Secondary | ICD-10-CM | POA: Diagnosis not present

## 2024-02-10 DIAGNOSIS — L570 Actinic keratosis: Secondary | ICD-10-CM | POA: Diagnosis not present

## 2024-02-11 NOTE — Telephone Encounter (Signed)
 Please of the patient know that I reviewed her cardiopulmonary exercise test.  Overall her functional capacity and exercise tolerance was normal.  There may have been some ventilatory reserve that can be consistent with age-related airflow obstruction (mild).  This is good news.  No evidence of underlying lung or heart disease to cause shortness of breath.

## 2024-02-13 ENCOUNTER — Telehealth: Payer: Self-pay

## 2024-02-13 NOTE — Telephone Encounter (Signed)
 Called and spoke with Mindi Junker regarding RB's note. Pt verbalized understanding and had no concerns.   Please of the patient know that I reviewed her cardiopulmonary exercise test.  Overall her functional capacity and exercise tolerance was normal.  There may have been some ventilatory reserve that can be consistent with age-related airflow obstruction (mild).  This is good news.  No evidence of underlying lung or heart disease to cause shortness of breath.     Pt stated her and Dr. Delton Coombes talked last time about pt scheduling an appt based off results. Pt has been feeling well and says she does not need an ov. Closing Mychart encounter. Nothing Further needed.

## 2024-02-17 ENCOUNTER — Other Ambulatory Visit: Payer: Self-pay | Admitting: Internal Medicine

## 2024-02-23 ENCOUNTER — Ambulatory Visit (INDEPENDENT_AMBULATORY_CARE_PROVIDER_SITE_OTHER)

## 2024-02-23 ENCOUNTER — Ambulatory Visit (INDEPENDENT_AMBULATORY_CARE_PROVIDER_SITE_OTHER): Admitting: Podiatry

## 2024-02-23 DIAGNOSIS — L84 Corns and callosities: Secondary | ICD-10-CM

## 2024-02-23 DIAGNOSIS — M79675 Pain in left toe(s): Secondary | ICD-10-CM

## 2024-02-23 DIAGNOSIS — L600 Ingrowing nail: Secondary | ICD-10-CM | POA: Diagnosis not present

## 2024-02-23 NOTE — Progress Notes (Unsigned)
 Chief Complaint  Patient presents with   Toe Pain    Pt stated that she has a place on her second toe that is causing her some pain    HPI: 73 y.o. female presents today noting history of injury to the left great toe on May 01, 2022.  She was seen by Dr. Charlsie Merles.  States that there was injury to the nail and nailbed.  This left her with an ingrown toenail on the medial border of the left great toenail in which he performed a PNA procedure on 02/20/2023 to the medial border of the toe.  She developed some regrowth and he performed a revisional removal of the border on 09/25/2023.  States her nail does not grow anymore.  She has secondary concern of skin lesions submet 5 bilateral.  As she is requesting nail care today stating that has some feel ingrown.  Denies any drainage  Past Medical History:  Diagnosis Date   Cataract    DDD (degenerative disc disease), cervical 2006   Depression 11/2004   resolved   Diverticulosis of colon 12/28/2019   Multiple large mouth colonoscopy finding   Dysphonia    Frequent PVCs    Hyperlipidemia    Hypothyroidism    Nocturnal leg cramps 06/06/2014   Non-seasonal allergic rhinitis    Osteoporosis 2017   SUI (stress urinary incontinence, female) 10/2006   Vitamin D deficiency disease 09/2006    Past Surgical History:  Procedure Laterality Date   BREAST BIOPSY Left 2021   BREAST EXCISIONAL BIOPSY Right 1991   benign   COMBINED HYSTERECTOMY VAGINAL W/ MMK / A&P REPAIR  1983   HAND SURGERY Left 10/2009   S-L    LEFT HEART CATH AND CORONARY ANGIOGRAPHY N/A 08/31/2019   Procedure: LEFT HEART CATH AND CORONARY ANGIOGRAPHY;  Surgeon: Swaziland, Peter M, MD;  Location: [facility] INVASIVE CV LAB;  Service: Cardiovascular;  Laterality: N/A;   Left needle biopsy     LIPOSUCTION     TONSILLECTOMY  as child   WISDOM TOOTH EXTRACTION     No Known Allergies   Physical Exam: Palpable pedal pulses are noted.  No edema is appreciated.  Digital hair is present.   Capillary refill is immediate.  Interspaces are clear of maceration and debris.  There are tiny focal hyperkeratotic lesion submet 5 bilateral without any surrounding erythema or ulceration.  The left hallux nail has evidence of previous avulsion of the medial border, with some resultant dystrophy noted to the nail.  Nails x 10 are slightly elongated.  No evidence of paronychia is noted.  Epicritic sensation is intact  Radiographic Exam:  Normal osseous mineralization. Joint spaces preserved.  No fracture noted.  Bipartite tibial sesamoid bone in position 3.  Medial angulation of the fifth toe at the level of the DIPJ   Assessment/Plan of Care: 1. Pain of toe of left foot   2. Ingrown toenail   3. Corns     DG FOOT COMPLETE LEFT  Discussed clinical and radiographic findings with patient today.  The nails x 10 were trimmed as a courtesy today.  Patient does not have qualifying class findings for Medicare to cover the service in the future, so an ABN will need to be signed if she would like to proceed with any type of routine footcare going forward, as this will not be covered by her insurance.  The hyperkeratotic lesions bilateral submet 5 were shaved uneventfully with a sterile #313 blade.  Due to the lack of growth and history of injury to the left great toenail, recommended often nail solution to help with the overall appearance of the toenail as well as laser nail treatment.  Informed the patient that quite often the laser nail treatment can help support some regrowth of the nail and also help with nail dystrophy.  Recommend at least 2-3 laser nail treatments for this issue.  Follow-up 1 to 2 months after the final laser nail treatment.   Clerance Lav, DPM, FACFAS [facility]     [address] [place], [place] [address]                Office [phone]  Fax [phone]

## 2024-02-24 ENCOUNTER — Other Ambulatory Visit: Payer: Self-pay | Admitting: Podiatry

## 2024-02-24 DIAGNOSIS — M79675 Pain in left toe(s): Secondary | ICD-10-CM

## 2024-03-09 ENCOUNTER — Ambulatory Visit: Attending: Internal Medicine | Admitting: Internal Medicine

## 2024-03-09 ENCOUNTER — Encounter: Payer: Self-pay | Admitting: Internal Medicine

## 2024-03-09 VITALS — BP 104/68 | HR 88 | Ht 65.5 in | Wt 123.8 lb

## 2024-03-09 DIAGNOSIS — I493 Ventricular premature depolarization: Secondary | ICD-10-CM | POA: Diagnosis not present

## 2024-03-09 NOTE — Patient Instructions (Addendum)
 Medication Instructions:  Your physician recommends that you continue on your current medications as directed. Please refer to the Current Medication list given to you today.  *If you need a refill on your cardiac medications before your next appointment, please call your pharmacy*  Lab Work: None ordered.  You may go to any [facility] Location for your lab work:  [facility] - [address] ([facility]) - [address] [phone] [address]  [place] - [address]   [place]  - [address]   [place] - [address] - [address] [employer]  - [address] [place] - [address] ([facility]   If you have labs (blood work) drawn today and your tests are completely normal, you will receive your results only by: [employer] (if you have MyChart)  If you have any lab test that is abnormal or we need to change your treatment, we will call you or send a MyChart message to review the results.  Testing/Procedures: None ordered.  Follow-Up: At [facility], you and your health needs are our priority.  As part of our continuing mission to provide you with exceptional heart care, we have created designated Provider Care Teams.  These Care Teams include your primary Cardiologist (physician) and Advanced Practice Providers (APPs -  Physician Assistants and Nurse Practitioners) who all work together to provide you with the care you need, when you need it.  Your next appointment:   1 year(s)  The format for your next appointment:   In Person  Provider:   Dr Virl Son one of the following Advanced Practice Providers on your designated Care Team:   Francis Dowse, PA-C Casimiro Needle "Tasley" Scarbro, [place] Earnest Rosier, NP           Valet parking services will be available as well.

## 2024-03-09 NOTE — Progress Notes (Signed)
 HPI [name] returns today for ongoing evaluation of her PVC's and sob. Since I saw her over a year ago, her dyspnea is improved. She is walking abuot 5 times a week. Her palpitations are present mostly at night. She wore a monitor which showed 18.5%. She has been maintained on toprol  25. On the higher dose she cannot feel a difference.She was noted to have diastolic dysfunction by echo 5 years ago. No anginal symptoms. No syncope.  No Known Allergies   Current Outpatient Medications  Medication Sig Dispense Refill   Biotin 5000 MCG CAPS Take 1 capsule by mouth daily.     Calcium  Carbonate (CALCIUM  600 PO) Take by mouth. 1200 mg     Cholecalciferol (VITAMIN D -3) 5000 UNITS TABS Take 5,000 Units by mouth daily.      CVS MAGNESIUM CITRATE PO Take 250 mg by mouth daily.      Cyanocobalamin  (B-12) 5000 MCG SUBL Place under the tongue. Taking twice per month.     famotidine (PEPCID) 20 MG tablet Take 20 mg by mouth at bedtime. PRN     fluticasone (FLONASE) 50 MCG/ACT nasal spray Place 1 spray into both nostrils daily as needed for allergies.      levothyroxine  (SYNTHROID ) 75 MCG tablet Take 1 tablet (75 mcg total) by mouth daily. 90 tablet 3   metoprolol  succinate (TOPROL -XL) 25 MG 24 hr tablet Take 1 tablet by mouth once daily 30 tablet 0   metroNIDAZOLE (METROCREAM) 0.75 % cream Apply 1 application topically daily as needed (rosacea).      mometasone  (ELOCON ) 0.1 % cream Apply 1 Application topically daily as needed (itchy ears). 45 g 3   olopatadine (PATANOL) 0.1 % ophthalmic solution Place 1 drop into both eyes 2 (two) times daily as needed for allergies.      No current facility-administered medications for this visit.     Past Medical History:  Diagnosis Date   Cataract    DDD (degenerative disc disease), cervical 2006   Depression 11/2004   resolved   Diverticulosis of colon 12/28/2019   Multiple large mouth colonoscopy finding   Dysphonia    Frequent PVCs     Hyperlipidemia    Hypothyroidism    Nocturnal leg cramps 06/06/2014   Non-seasonal allergic rhinitis    Osteoporosis 2017   SUI (stress urinary incontinence, female) 10/2006   Vitamin D  deficiency disease 09/2006    ROS:   All systems reviewed and negative except as noted in the HPI.   Past Surgical History:  Procedure Laterality Date   BREAST BIOPSY Left 2021   BREAST EXCISIONAL BIOPSY Right 1991   benign   COMBINED HYSTERECTOMY VAGINAL W/ MMK / A&P REPAIR  1983   HAND SURGERY Left 10/2009   S-L    LEFT HEART CATH AND CORONARY ANGIOGRAPHY N/A 08/31/2019   Procedure: LEFT HEART CATH AND CORONARY ANGIOGRAPHY;  Surgeon: Swaziland, Peter M, MD;  Location: [facility] INVASIVE CV LAB;  Service: Cardiovascular;  Laterality: N/A;   Left needle biopsy     LIPOSUCTION     TONSILLECTOMY  as child   WISDOM TOOTH EXTRACTION       Family History  Problem Relation Age of Onset   Cervical cancer Mother 55   Osteoarthritis Mother    Dementia Mother    Breast cancer Mother 66 - 25       bilateral   Endometriosis Sister    Throat cancer Maternal Uncle 54   Cancer Maternal Uncle  65       unknown type; ? pancreatic   Lung cancer Maternal Uncle 79   Stroke Maternal Grandmother    Cancer Cousin 69       tonsil; maternal female cousin   Breast cancer Cousin 36       DCIS; maternal female cousin   Heart disease Brother    Stroke Brother    Colon polyps Son        one polyp; before age 68   Skin cancer Son 43       non-melanoma; shoulder     Social History   Socioeconomic History   Marital status: Married    Spouse name: Not on file   Number of children: 2   Years of education: college-1   Highest education level: Not on file  Occupational History   Occupation: Retired     Associate Professor: [employer]  Tobacco Use   Smoking status: Former    Current packs/day: 0.00    Average packs/day: 0.3 packs/day for 8.0 years (2.0 ttl pk-yrs)    Types: Cigarettes    Start date: 11/19/1963     Quit date: 11/19/1971    Years since quitting: 52.3   Smokeless tobacco: Never  Vaping Use   Vaping status: Never Used  Substance and Sexual Activity   Alcohol use: Yes    Comment: socially   Drug use: No   Sexual activity: Not Currently    Partners: Male    Birth control/protection: Surgical, Post-menopausal    Comment: hysterectomy  Other Topics Concern   Not on file  Social History Narrative   Recently retired Feb 2021   Social Drivers of Health   Financial Resource Strain: Low Risk  (02/08/2021)   Overall Financial Resource Strain (CARDIA)    Difficulty of Paying Living Expenses: Not hard at all  Food Insecurity: No Food Insecurity (02/08/2021)   Hunger Vital Sign    Worried About Running Out of Food in the Last Year: Never true    Ran Out of Food in the Last Year: Never true  Transportation Needs: No Transportation Needs (02/08/2021)   PRAPARE - Administrator, Civil Service (Medical): No    Lack of Transportation (Non-Medical): No  Physical Activity: Sufficiently Active (02/08/2021)   Exercise Vital Sign    Days of Exercise per Week: 7 days    Minutes of Exercise per Session: 50 min  Stress: Stress Concern Present (02/08/2021)   [employer] of Occupational Health - Occupational Stress Questionnaire    Feeling of Stress : To some extent  Social Connections: Moderately Isolated (02/08/2021)   Social Connection and Isolation Panel [NHANES]    Frequency of Communication with Friends and Family: More than three times a week    Frequency of Social Gatherings with Friends and Family: More than three times a week    Attends Religious Services: Never    Database administrator or Organizations: No    Attends Banker Meetings: Never    Marital Status: Married  Catering manager Violence: Not At Risk (02/08/2021)   Humiliation, Afraid, Rape, and Kick questionnaire    Fear of Current or Ex-Partner: No    Emotionally Abused: No    Physically Abused:  No    Sexually Abused: No     BP 104/68   Pulse 88   Ht 5' 5.5" (1.664 m)   Wt 123 lb 12.8 oz (56.2 kg)   LMP 08/18/1982 (Approximate)   SpO2  99%   BMI 20.29 kg/m   Physical Exam:  Well appearing NAD HEENT: Unremarkable Neck:  No JVD, no thyromegally Lymphatics:  No adenopathy Back:  No CVA tenderness Lungs:  Clear with no wheezes HEART:  Regular rate rhythm, no murmurs, no rubs, no clicks Abd:  soft, positive bowel sounds, no organomegally, no rebound, no guarding Ext:  2 plus pulses, no edema, no cyanosis, no clubbing Skin:  No rashes no nodules Neuro:  CN II through XII intact, motor grossly intact  EKG - nsr with PVC's which appear to be outflow tract.  DEVICE  Normal device function.  See PaceArt for details.   Assess/Plan:  1. Exertional dyspnea - her symptoms are stable she walked almost 8 minutes on a Bruce protocol with no ischemic changes. 2. PVC's - her symptoms are stable but she has 18.5% burden. She will continue toprol . We discussed adding flecainide as well as catheter ablation. For now she will continue the toprol . If she gets worse we will add flecainide and titrate up.    Pete Brand Naia Ruff,MD

## 2024-03-11 ENCOUNTER — Encounter: Payer: Self-pay | Admitting: Internal Medicine

## 2024-03-15 ENCOUNTER — Other Ambulatory Visit: Payer: Self-pay | Admitting: Internal Medicine

## 2024-04-27 ENCOUNTER — Telehealth: Payer: Self-pay | Admitting: Podiatry

## 2024-04-27 NOTE — Telephone Encounter (Signed)
 Called patient to reschedule her laser appointment but she is requesting some literature to look over so she can further educate herself on the benefits of the treatment. Is there anything you would like me to send over or perhaps somewhere patient could go online to look? Please advise, Thanks

## 2024-05-07 ENCOUNTER — Other Ambulatory Visit

## 2024-05-26 ENCOUNTER — Ambulatory Visit
Admission: RE | Admit: 2024-05-26 | Discharge: 2024-05-26 | Disposition: A | Discharge status: Home / Self Care | Source: Ambulatory Visit | Attending: Nurse Practitioner | Admitting: Nurse Practitioner

## 2024-05-26 ENCOUNTER — Other Ambulatory Visit: Payer: Self-pay

## 2024-05-26 VITALS — BP 124/65 | HR 63 | Temp 98.5°F | Resp 20

## 2024-05-26 DIAGNOSIS — H5789 Other specified disorders of eye and adnexa: Secondary | ICD-10-CM | POA: Diagnosis not present

## 2024-05-26 MED ORDER — OLOPATADINE HCL 0.7 % OP SOLN: OPHTHALMIC | 0 refills | Status: AC

## 2024-05-26 NOTE — Discharge Instructions (Signed)
 Use eyedrops as prescribed.  Recommend using them in both eyes. Cool compresses to the eyes to help with pain or swelling. May continue Systane eyedrops to help keep the eyes moist and lubricated. Strict handwashing when applying medication.   Avoid rubbing or manipulating the eyes while symptoms persist. Discard any eye make-up that were using prior to your symptoms starting. Follow-up if symptoms do not improve.

## 2024-05-26 NOTE — ED Triage Notes (Signed)
 Pt reports left eye itching for last several days. Pt reports feels like something is in it. Denies any known injury or foreign body. Denies any visual changes.

## 2024-05-26 NOTE — ED Provider Notes (Signed)
 [facility]    CSN: [id number] Arrival date & time: 05/26/24  1455      History   Chief Complaint Chief Complaint  Patient presents with   Eye Problem    HPI Sharon CONTINO is a 73 y.o. female.   The history is provided by the patient.   Patient presents for complaints of left eye itching for the past several days.  Patient reports they feels like pressure behind the left eye.  She denies pain, but states that there is some slight discomfort.  Patient denies fever, chills, eye drainage, swelling, redness, visual changes, or light sensitivity.  Patient reports that she also has some itching in her ears.  Per review of her chart, patient's chart does reveal chronic allergic rhinitis.  States she has been using Systane eyedrops for her symptoms with minimal relief.  Patient reports that she wears eyeglasses only.  Past Medical History:  Diagnosis Date   Cataract    DDD (degenerative disc disease), cervical 2006   Depression 11/2004   resolved   Diverticulosis of colon 12/28/2019   Multiple large mouth colonoscopy finding   Dysphonia    Frequent PVCs    Hyperlipidemia    Hypothyroidism    Nocturnal leg cramps 06/06/2014   Non-seasonal allergic rhinitis    Osteoporosis 2017   SUI (stress urinary incontinence, female) 10/2006   Vitamin D  deficiency disease 09/2006    Patient Active Problem List   Diagnosis Date Noted   Notalgia paresthetica 04/17/2023   Dyspnea 12/20/2022   At high risk for breast cancer 09/12/2021   Genetic testing 08/14/2021   Urgency incontinence 04/17/2021   Primary osteoarthritis of right hand 04/20/2020   Acquired hypothyroidism 12/28/2019   Allergic conjunctivitis 12/28/2019   Diverticulosis of colon 12/28/2019   Rosacea 12/28/2019   Mixed hyperlipidemia 08/31/2019   Esophageal dysphagia 07/01/2019   Gastroesophageal reflux disease 07/01/2019   Chronic idiopathic constipation 07/01/2019   Vitreous degeneration of both eyes  02/18/2019   Frequent PVCs 10/07/2018   Chronic allergic rhinitis 02/21/2018   Family history of breast cancer 02/21/2018   Hepatic cyst 02/21/2018   Laryngopharyngeal reflux (LPR) 07/16/2017   Osteoporosis 07/16/2017   Nocturnal leg cramps 06/06/2014    Past Surgical History:  Procedure Laterality Date   BREAST BIOPSY Left 2021   BREAST EXCISIONAL BIOPSY Right 1991   benign   COMBINED HYSTERECTOMY VAGINAL W/ MMK / A&P REPAIR  1983   HAND SURGERY Left 10/2009   S-L    LEFT HEART CATH AND CORONARY ANGIOGRAPHY N/A 08/31/2019   Procedure: LEFT HEART CATH AND CORONARY ANGIOGRAPHY;  Surgeon: [place], Peter M, MD;  Location: [facility] INVASIVE CV LAB;  Service: Cardiovascular;  Laterality: N/A;   Left needle biopsy     LIPOSUCTION     TONSILLECTOMY  as child   WISDOM TOOTH EXTRACTION      OB History     Gravida  2   Para  2   Term  2   Preterm  0   AB  0   Living  2      SAB  0   IAB  0   Ectopic  0   Multiple  0   Live Births  2            Home Medications    Prior to Admission medications   Medication Sig Start Date End Date Taking? Authorizing Provider  Olopatadine  HCl 0.7 % SOLN Apply 1 drop in the left  eye twice daily until symptoms improve. 05/26/24  Yes Leath-Warren, Etta PARAS, NP  Biotin 5000 MCG CAPS Take 1 capsule by mouth daily.    [provider]  Calcium  Carbonate (CALCIUM  600 PO) Take by mouth. 1200 mg    [provider]  Cholecalciferol (VITAMIN D -3) 5000 UNITS TABS Take 5,000 Units by mouth daily.     [provider]  [facility]S MAGNESIUM CITRATE PO Take 250 mg by mouth daily.     [provider]  Cyanocobalamin  (B-12) 5000 MCG SUBL Place under the tongue. Taking twice per month.    [provider]  famotidine (PEPCID) 20 MG tablet Take 20 mg by mouth at bedtime. PRN    [provider]  fluticasone (FLONASE) 50 MCG/ACT nasal spray Place 1 spray into both nostrils daily as needed for allergies.      [provider]  levothyroxine  (SYNTHROID ) 75 MCG tablet Take 1 tablet (75 mcg total) by mouth daily. 12/09/23   Jodie Lavern CROME, MD  metoprolol  succinate (TOPROL -XL) 25 MG 24 hr tablet Take 1 tablet (25 mg total) by mouth daily. 03/18/24   Waddell Danelle ORN, MD  metroNIDAZOLE (METROCREAM) 0.75 % cream Apply 1 application topically daily as needed (rosacea).     [provider]  mometasone  (ELOCON ) 0.1 % cream Apply 1 Application topically daily as needed (itchy ears). 09/16/22   Jodie Lavern CROME, MD    Family History Family History  Problem Relation Age of Onset   Cervical cancer Mother 40   Osteoarthritis Mother    Dementia Mother    Breast cancer Mother 60 - 3       bilateral   Endometriosis Sister    Throat cancer Maternal Uncle 59   Cancer Maternal Uncle 23       unknown type; ? pancreatic   Lung cancer Maternal Uncle 79   Stroke Maternal Grandmother    Cancer Cousin 54       tonsil; maternal female cousin   Breast cancer Cousin 40       DCIS; maternal female cousin   Heart disease Brother    Stroke Brother    Colon polyps Son        one polyp; before age 71   Skin cancer Son 38       non-melanoma; shoulder    Social History Social History   Tobacco Use   Smoking status: Former    Current packs/day: 0.00    Average packs/day: 0.3 packs/day for 8.0 years (2.0 ttl pk-yrs)    Types: Cigarettes    Start date: 11/19/1963    Quit date: 11/19/1971    Years since quitting: 52.5   Smokeless tobacco: Never  Vaping Use   Vaping status: Never Used  Substance Use Topics   Alcohol use: Yes    Comment: socially   Drug use: No     Allergies   Patient has no known allergies.   Review of Systems Review of Systems Per HPI  Physical Exam Triage Vital Signs ED Triage Vitals  Encounter Vitals Group     BP 05/26/24 1516 124/65     Girls Systolic BP Percentile --      Girls Diastolic BP Percentile --      Boys Systolic BP Percentile --      Boys Diastolic  BP Percentile --      Pulse Rate 05/26/24 1516 63     Resp 05/26/24 1516 20     Temp 05/26/24 1516 98.5  F (36.9 C)     Temp Source 05/26/24 1516 Oral     SpO2 05/26/24 1516 94 %     Weight --      Height --      Head Circumference --      Peak Flow --      Pain Score 05/26/24 1515 0     Pain Loc --      Pain Education --      Exclude from Growth Chart --    No data found.  Updated Vital Signs BP 124/65 (BP Location: Right Arm)   Pulse 63   Temp 98.5 F (36.9 C) (Oral)   Resp 20   LMP 08/18/1982 (Approximate)   SpO2 94%   Visual Acuity Right Eye Distance:   Left Eye Distance:   Bilateral Distance:    Right Eye Near:   Left Eye Near:    Bilateral Near:     Physical Exam Vitals and nursing note reviewed.  Constitutional:      General: She is not in acute distress.    Appearance: Normal appearance.  HENT:     Head: Normocephalic.     Right Ear: Tympanic membrane, ear canal and external ear normal.     Left Ear: Ear canal and external ear normal.  Eyes:     General: Lids are normal. Vision grossly intact. Gaze aligned appropriately. No visual field deficit.       Right eye: No foreign body, discharge or hordeolum.        Left eye: No foreign body, discharge or hordeolum.     Extraocular Movements: Extraocular movements intact.     Right eye: Normal extraocular motion and no nystagmus.     Left eye: Normal extraocular motion and no nystagmus.     Conjunctiva/sclera: Conjunctivae normal.     Left eye: Left conjunctiva is not injected. No chemosis, exudate or hemorrhage.    Pupils: Pupils are equal, round, and reactive to light.  Pulmonary:     Effort: Pulmonary effort is normal.  Musculoskeletal:     Cervical back: Normal range of motion.  Skin:    General: Skin is warm and dry.  Neurological:     General: No focal deficit present.     Mental Status: She is alert and oriented to person, place, and time.  Psychiatric:        Mood and Affect: Mood normal.         Behavior: Behavior normal.      UC Treatments / Results  Labs (all labs ordered are listed, but only abnormal results are displayed) Labs Reviewed - No data to display  EKG   Radiology No results found.  Procedures Procedures (including critical care time)  Medications Ordered in UC Medications - No data to display  Initial Impression / Assessment and Plan / UC Course  I have reviewed the triage vital signs and the nursing notes.  Pertinent labs & imaging results that were available during my care of the patient were reviewed by me and considered in my medical decision making (see chart for details).  On exam, patient with no obvious eye redness, drainage, periorbital swelling, present.  She also does not not have any obvious upper respiratory symptoms.  Symptoms consistent with possible allergic rhinitis.  Will treat with Pataday  0.7% eyedrops for patient to apply twice daily.  Supportive care recommendations were provided and discussed with the patient to include over-the-counter analgesics, cool compresses to the eye, and to  continue Systane eyedrops to keep the eye moist and lubricated.  Discussed indications with patient regarding follow-up.  Patient was in agreement with this plan of care and verbalizes understanding.  All questions were answered.  Patient stable for discharge.  Final Clinical Impressions(s) / UC Diagnoses   Final diagnoses:  Irritation of left eye     Discharge Instructions      Use eyedrops as prescribed.  Recommend using them in both eyes. Cool compresses to the eyes to help with pain or swelling. May continue Systane eyedrops to help keep the eyes moist and lubricated. Strict handwashing when applying medication.   Avoid rubbing or manipulating the eyes while symptoms persist. Discard any eye make-up that were using prior to your symptoms starting. Follow-up if symptoms do not improve.      ED Prescriptions     Medication Sig  Dispense Auth. Provider   Olopatadine  HCl 0.7 % SOLN Apply 1 drop in the left eye twice daily until symptoms improve. 5 mL Leath-Warren, Etta PARAS, NP      PDMP not reviewed this encounter.   Gilmer Etta PARAS, NP 05/26/24 1609

## 2024-09-09 NOTE — Telephone Encounter (Signed)
 VARIANT RECLASSIFICATION -DOWNGRADE  I spoke with Sharon Harrell to discuss her reclassified testing results. Sharon Harrell reached out after getting an email from [facility] that there was an update in her genetic testing results. 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   A copy of her updated lab report has been sent to her via Mychart as well as a copy of her re-calculated Tyrer Cuzick score. We discussed that her estimated lifetime risk for breast cancer is 9%. Which is not considered high-risk per NCCN. A copy of the updated Sharon Harrell was also sent via MyChart. 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 The change in variant classification was made as a result of re-review of the evidence in light of new variant interpretation guidelines and/or new information.  Updated Lab Report:

## 2024-09-14 ENCOUNTER — Telehealth: Payer: Self-pay

## 2024-09-14 NOTE — Telephone Encounter (Signed)
 Copied from CRM [id number]. Topic: Clinical - Request for Lab/Test Order >> Sep 14, 2024  1:55 PM Tinnie C wrote: Reason for CRM: Pt wants blood work to be done prior to coming in for appt 11/6 so she can go over results with Dr. Jodie. She is asking for any labs that Dr. Jodie recommends, as well as checking b12 and vitamin D  and potassium. She wants to know if there is any lab that might explain why she has been cramping so much all over her body. Her callback # is [phone]  Message sent to pcp to address

## 2024-09-15 NOTE — Telephone Encounter (Signed)
 Spoke with pt to let her know labs will have to be done on the day of appt

## 2024-09-23 ENCOUNTER — Ambulatory Visit: Payer: Medicare Other | Admitting: Family Medicine

## 2024-09-23 ENCOUNTER — Encounter: Payer: Self-pay | Admitting: Family Medicine

## 2024-09-23 VITALS — BP 150/70 | HR 63 | Temp 97.8°F | Ht 65.0 in | Wt 121.2 lb

## 2024-09-23 DIAGNOSIS — I493 Ventricular premature depolarization: Secondary | ICD-10-CM | POA: Diagnosis not present

## 2024-09-23 DIAGNOSIS — E782 Mixed hyperlipidemia: Secondary | ICD-10-CM | POA: Diagnosis not present

## 2024-09-23 DIAGNOSIS — Z803 Family history of malignant neoplasm of breast: Secondary | ICD-10-CM

## 2024-09-23 DIAGNOSIS — K5904 Chronic idiopathic constipation: Secondary | ICD-10-CM

## 2024-09-23 DIAGNOSIS — E039 Hypothyroidism, unspecified: Secondary | ICD-10-CM | POA: Diagnosis not present

## 2024-09-23 DIAGNOSIS — M810 Age-related osteoporosis without current pathological fracture: Secondary | ICD-10-CM | POA: Diagnosis not present

## 2024-09-23 DIAGNOSIS — K219 Gastro-esophageal reflux disease without esophagitis: Secondary | ICD-10-CM

## 2024-09-23 DIAGNOSIS — R252 Cramp and spasm: Secondary | ICD-10-CM

## 2024-09-23 DIAGNOSIS — Z9189 Other specified personal risk factors, not elsewhere classified: Secondary | ICD-10-CM

## 2024-09-23 DIAGNOSIS — E559 Vitamin D deficiency, unspecified: Secondary | ICD-10-CM | POA: Diagnosis not present

## 2024-09-23 LAB — CBC WITH DIFFERENTIAL/PLATELET
Basophils Absolute: 0 K/uL (ref 0.0–0.1)
Basophils Relative: 0.3 % (ref 0.0–3.0)
Eosinophils Absolute: 0 K/uL (ref 0.0–0.7)
Eosinophils Relative: 0.7 % (ref 0.0–5.0)
HCT: 41 % (ref 36.0–46.0)
Hemoglobin: 14 g/dL (ref 12.0–15.0)
Lymphocytes Relative: 22.5 % (ref 12.0–46.0)
Lymphs Abs: 1.2 K/uL (ref 0.7–4.0)
MCHC: 34.3 g/dL (ref 30.0–36.0)
MCV: 90.6 fl (ref 78.0–100.0)
Monocytes Absolute: 0.4 K/uL (ref 0.1–1.0)
Monocytes Relative: 6.8 % (ref 3.0–12.0)
Neutro Abs: 3.7 K/uL (ref 1.4–7.7)
Neutrophils Relative %: 69.7 % (ref 43.0–77.0)
Platelets: 206 K/uL (ref 150.0–400.0)
RBC: 4.52 Mil/uL (ref 3.87–5.11)
RDW: 12.7 % (ref 11.5–15.5)
WBC: 5.4 K/uL (ref 4.0–10.5)

## 2024-09-23 LAB — COMPREHENSIVE METABOLIC PANEL WITH GFR
ALT: 18 U/L (ref 0–35)
AST: 19 U/L (ref 0–37)
Albumin: 4.3 g/dL (ref 3.5–5.2)
Alkaline Phosphatase: 72 U/L (ref 39–117)
BUN: 18 mg/dL (ref 6–23)
CO2: 30 meq/L (ref 19–32)
Calcium: 9.1 mg/dL (ref 8.4–10.5)
Chloride: 100 meq/L (ref 96–112)
Creatinine, Ser: 0.78 mg/dL (ref 0.40–1.20)
GFR: 75.85 mL/min (ref >60.00)
Glucose, Bld: 82 mg/dL (ref 70–99)
Potassium: 5.2 meq/L — ABNORMAL HIGH (ref 3.5–5.1)
Sodium: 138 meq/L (ref 135–145)
Total Bilirubin: 1 mg/dL (ref 0.2–1.2)
Total Protein: 6.5 g/dL (ref 6.0–8.3)

## 2024-09-23 LAB — LIPID PANEL
Cholesterol: 214 mg/dL — ABNORMAL HIGH (ref 0–200)
HDL: 77.3 mg/dL (ref >39.00)
LDL Cholesterol: 122 mg/dL — ABNORMAL HIGH (ref 0–99)
NonHDL: 136.48
Total CHOL/HDL Ratio: 3
Triglycerides: 71 mg/dL (ref 0.0–149.0)
VLDL: 14.2 mg/dL (ref 0.0–40.0)

## 2024-09-23 LAB — B12 AND FOLATE PANEL
Folate: >23.7 ng/mL (ref >5.9)
Vitamin B-12: 548 pg/mL (ref 211–911)

## 2024-09-23 LAB — T3, FREE: T3, Free: 3 pg/mL (ref 2.3–4.2)

## 2024-09-23 LAB — TSH: TSH: 0.95 u[IU]/mL (ref 0.35–5.50)

## 2024-09-23 LAB — T4, FREE: Free T4: 1.16 ng/dL (ref 0.60–1.60)

## 2024-09-23 LAB — VITAMIN D 25 HYDROXY (VIT D DEFICIENCY, FRACTURES): VITD: 75.1 ng/mL (ref 30.00–100.00)

## 2024-09-23 LAB — CK: Total CK: 63 U/L (ref 17–177)

## 2024-09-23 MED ORDER — CYCLOBENZAPRINE HCL 10 MG PO TABS: 10.0000 mg | ORAL_TABLET | Freq: Every evening | ORAL | 0 refills | Status: AC | PRN

## 2024-09-23 NOTE — Progress Notes (Signed)
 Subjective  Chief Complaint  Patient presents with   Annual Exam    Pt here for Annual exam and is currently fasting     HPI: Sharon Harrell is a 73 y.o. female who presents to [facility] [place] today for a Female Wellness Visit. She also has the concerns and/or needs as listed above in the chief complaint. These will be addressed in addition to the Health Maintenance Visit.   Wellness Visit: annual visit with health maintenance review and exam  HM: last colonoscopy 2020. Mammo current.  Overall breast cancer risk has been downgraded from 20% to 9%.  This is great news.  Genetic testing has shown her variation to be a normal variant.  No longer needing breast MRIs.  Mammograms are currently up-to-date.  Happy about this.  Last bone density shows stability of Fosamax , remains in the osteopenic range after history of this treated with Fosamax .  Walks for exercise.  Weight is down since giving up sugar.  Weight is down 15 pounds in the last year and a half, but has been stable for over a year.  She feels physically well.  Eats clean.  Chronic disease f/u and/or acute problem visit: (deemed necessary to be done in addition to the wellness visit): Discussed the use of AI scribe software for clinical note transcription with the patient, who gave verbal consent to proceed.  History of Present Illness CORTEZ STEELMAN is a 73 year old female who presents for an annual physical exam.  Muscle cramps and myalgias - Longstanding muscle cramps worsening over the last 30 years evaluated by rheumatologist and neurologist. - Described as 'gripping vice type things' around ankles, knees, thighs; sometimes affecting hands and diaphragm - Cramps are severe enough to interrupt sleep - Bruising occurs after episodes - Previously evaluated by specialists, including a neurologist - Currently taking magnesium citrate 200 mg - Previously stopped potassium due to elevated levels; considering  resuming if levels normalize - Admits to not drinking much fluid during the day.  Limited stretching. - Denies restless leg symptoms.  Dyspnea and fatigue - Shortness of breath and fatigue present - Pulmonary and cardiology evaluations have been done.  PFTs look good overall.  Chest x-ray was normal.  Cardiology eval showed frequent PVCs now on beta-blocker.  Negative stress testing.  Palpitations - History of palpitations, particularly when lying down - Previously monitored by cardiology; increase in PVCs noted - Continues to experience palpitations but has adapted to symptoms - Maintain on metoprolol  for now.  Peripheral cyanosis and suspected raynaud's phenomenon - Cold hands and feet, turning purple or blue - Suspects Raynaud's phenomenon - Condition noted to be worsening by her husband, a certified hand therapist  Chronic constipation stable.  GERD is stable on PPI. Due for lipid recheck    Assessment  1. Acquired hypothyroidism   2. Frequent PVCs   3. At high risk for breast cancer   4. Chronic idiopathic constipation   5. Family history of breast cancer   6. Gastroesophageal reflux disease without esophagitis   7. Laryngopharyngeal reflux (LPR)   8. Age-related osteoporosis without current pathological fracture   9. Mixed hyperlipidemia   10. Vitamin D  deficiency   11. Muscle cramps      Plan  Female Wellness Visit: Age appropriate Health Maintenance and Prevention measures were discussed with patient. Included topics are cancer screening recommendations, ways to keep healthy (see AVS) including dietary and exercise recommendations, regular eye and dental care,  use of seat belts, and avoidance of moderate alcohol use and tobacco use.  BMI: discussed patient's BMI and encouraged positive lifestyle modifications to help get to or maintain a target BMI. HM needs and immunizations were addressed and ordered. See below for orders. See HM and immunization section for  updates. Routine labs and screening tests ordered including cmp, cbc and lipids where appropriate. Discussed recommendations regarding Vit D and calcium  supplementation (see AVS)  Chronic disease management visit and/or acute problem visit: Assessment and Plan Assessment & Plan Adult Wellness Visit Routine adult wellness visit with discussion on general health maintenance, including screenings and lifestyle modifications. Addressed concerns about colonoscopy screening and fecal occult blood test. Discussed the importance of hydration and stretching for muscle cramps. - Ordered fecal occult blood test - Encouraged hydration and stretching exercises - Discussed colonoscopy screening options  Chronic leg cramps with sleep disturbance and bruising Chronic leg cramps for 25 years, recently escalating in severity, causing sleep disturbance and bruising. Previous treatments include magnesium citrate and potassium, with potassium discontinued due to hyperkalemia. Current symptoms include excruciating cramps, bruising, and discoloration of extremities. Differential includes dehydration, tight muscles, and possible Raynaud's phenomenon. Discussed potential benefits of quinine in tonic water and muscle relaxers for symptom relief. - Encouraged hydration and stretching exercises - Recommended tonic water with quinine for muscle cramps - Will consider muscle relaxers for symptom relief - Will check muscle enzymes and electrolytes - Will consider massage therapy  Ventricular premature depolarizations (PVCs) Increased PVCs with previous cardiology evaluation. Current management includes monitoring and potential medication adjustment. Discussed challenges of ablation due to precise targeting requirements. - Continue monitoring PVCs stable on metoprolol  25 daily - Cards considering medication adjustment if necessary including flecainide versus ablation if worsens.  Hypothyroidism: Weight loss and worsening  leg cramps, recheck TSH.  Dose may need to be adjusted due to weight loss. Potential need for medication adjustment due to recent weight loss. Discussed importance of protein and healthy fats in diet. - Will adjust thyroid  medication if necessary  Raynaud's phenomenon (suspected) Suspected Raynaud's phenomenon due to discoloration of extremities and symptoms of cold intolerance.  Recheck fasting lipids. Continue Pepcid for GERD  I personally spent a total of 50 minutes in the care of the patient today including preparing to see the patient, getting/reviewing separately obtained history, performing a medically appropriate exam/evaluation, counseling and educating, placing orders, documenting clinical information in the EHR, independently interpreting results, communicating results, and coordinating care.   Follow up: 1 year for complete physical Orders Placed This Encounter  Procedures   CBC with Differential/Platelet   Comprehensive metabolic panel with GFR   Lipid panel   TSH   VITAMIN D  25 Hydroxy (Vit-D Deficiency, Fractures)   T3, free   T4, free   CK   Iron, TIBC and Ferritin Panel   B12 and Folate Panel   No orders of the defined types were placed in this encounter.     Body mass index is 20.17 kg/m. Wt Readings from Last 3 Encounters:  09/23/24 121 lb 3.2 oz (55 kg)  03/09/24 123 lb 12.8 oz (56.2 kg)  01/30/24 124 lb 12.8 oz (56.6 kg)     Patient Active Problem List   Diagnosis Date Noted   Dyspnea 12/20/2022    Priority: High    12/2022: dyspnea eval: false positive ETT then low risk Cardiac calcium  score: Coronary calcium  score is 2, which places the patient in the 41st percentile for age and sex matched control. Crestor   5 started for primary prevention PFTs were ok.  Dr. Jordan    At high risk for breast cancer 09/12/2021    Priority: High   Acquired hypothyroidism 12/28/2019    Priority: High   Mixed hyperlipidemia 08/31/2019    Priority: High    Frequent PVCs 10/07/2018    Priority: High    Cardiac monitor: 18% burden managed with toprol  xl 25. Would consider flecanaide or ablation if worsens, 20205 Normal cardiac cath and nl EF 08/2019, Dr. Jordan 2024: dyspnea eval: false positive ETT then low risk Cardiac calcium  score: Coronary calcium  score is 2, which places the patient in the 41st percentile for age and sex matched control. Crestor  5 started for primary prevention PFTs were ok.     Osteoporosis 07/16/2017    Priority: High    Dr. Jannis: 2017: T = -2.5, started meds: f/u 2019 improved T = - 2.1 on fosamax . F/u dexa 2021: T - -2.2, stopped fosamax  after years Dexa 2024: lowest T right fem neck -2.3. stable. Continue same. No RX meds recommended.     Urgency incontinence 04/17/2021    Priority: Medium    Esophageal dysphagia 07/01/2019    Priority: Medium     Followed by  Dr. Kristie last seen 06/08/19 EGD ordered.  EGD preformed on [id number] impression: normal appearing widely patent esophagus and GEJ-Z line was measured at 40 cm. Mild antral gastritis. Normal examined duodenum. No specimens collected.     Gastroesophageal reflux disease 07/01/2019    Priority: Medium     Followed by  Dr. Kristie last seen 06/08/19    Chronic idiopathic constipation 07/01/2019    Priority: Medium     Followed by  Dr. Kristie last seen 06/08/19    Family history of breast cancer 02/21/2018    Priority: Medium     Lifetime risk is greater than 20%. She has history of a benign surgical excisional biopsy of the right breast in 1991.    Laryngopharyngeal reflux (LPR) 07/16/2017    Priority: Medium    Notalgia paresthetica 04/17/2023    Priority: Low   Genetic testing 08/14/2021    Priority: Low    Negative hereditary cancer genetic testing: no pathogenic variants detected in [facility] Multi-Cancer +RNA Panel. Variants of uncertain significance in PMS2 at c.1510G>C (p.Glu504Gln) was reclassified to benign. The change in variant classification was  made as a result of re-review of the evidence in light of new variant interpretation guidelines and/or new information.The report date is June 16, 2024.   The Multi-Cancer + RNA Panel offered by [facility] includes sequencing and/or deletion/duplication analysis of the following 84 genes:  AIP*, ALK, APC*, ATM*, AXIN2*, BAP1*, BARD1*, BLM*, BMPR1A*, BRCA1*, BRCA2*, BRIP1*, CASR, CDC73*, CDH1*, CDK4, CDKN1B*, CDKN1C*, CDKN2A, CEBPA, CHEK2*, CTNNA1*, DICER1*, DIS3L2*, EGFR, EPCAM, FH*, FLCN*, GATA2*, GPC3, GREM1, HOXB13, HRAS, KIT, MAX*, MEN1*, MET, MITF, MLH1*, MSH2*, MSH3*, MSH6*, MUTYH*, NBN*, NF1*, NF2*, NTHL1*, PALB2*, PDGFRA, PHOX2B, PMS2*, POLD1*, POLE*, POT1*, PRKAR1A*, PTCH1*, PTEN*, RAD50*, RAD51C*, RAD51D*, RB1*, RECQL4, RET, RUNX1*, SDHA*, SDHAF2*, SDHB*, SDHC*, SDHD*, SMAD4*, SMARCA4*, SMARCB1*, SMARCE1*, STK11*, SUFU*, TERC, TERT, TMEM127*, Tp53*, TSC1*, TSC2*, VHL*, WRN*, and WT1.  RNA analysis is performed for * genes.     Primary osteoarthritis of right hand 04/20/2020    Priority: Low   Allergic conjunctivitis 12/28/2019    Priority: Low   Diverticulosis of colon 12/28/2019    Priority: Low    Multiple large mouth colonoscopy finding    Rosacea 12/28/2019    Priority: Low  Vitreous degeneration of both eyes 02/18/2019    Priority: Low   Chronic allergic rhinitis 02/21/2018    Priority: Low   Hepatic cyst 02/21/2018    Priority: Low   Nocturnal leg cramps 06/06/2014    Priority: Low   Health Maintenance  Topic Date Due   Medicare Annual Wellness (AWV)  02/08/2022   Influenza Vaccine  02/15/2025 (Originally 06/18/2024)   Mammogram  01/08/2025   DEXA SCAN  01/29/2025   DTaP/Tdap/Td (2 - Tdap) 02/24/2029   Colonoscopy  07/13/2029   Pneumococcal Vaccine: 50+ Years  Completed   Hepatitis C Screening  Completed   Zoster Vaccines- Shingrix  Completed   Meningococcal B Vaccine  Aged Out   COVID-19 Vaccine  Discontinued   Immunization History  Administered Date(s)  Administered   INFLUENZA, HIGH DOSE SEASONAL PF 08/22/2017, 08/31/2018, 09/08/2019   Influenza-Unspecified 08/22/2017   PFIZER(Purple Top)SARS-COV-2 Vaccination 01/29/2020, 02/23/2020   Pneumococcal Conjugate-13 11/02/2018   Pneumococcal Polysaccharide-23 12/28/2019   Pneumococcal-Unspecified 10/18/2020   Td 02/25/2019   Zoster Recombinant(Shingrix) 10/16/2020, 02/15/2021   Zoster, Live 04/18/2012   We updated and reviewed the patient's past history in detail and it is documented below. Allergies: Patient has no known allergies. Past Medical History Patient  has a past medical history of Cataract, DDD (degenerative disc disease), cervical (2006), Depression (11/2004), Diverticulosis of colon (12/28/2019), Dysphonia, Frequent PVCs, Hyperlipidemia, Hypothyroidism, Nocturnal leg cramps (06/06/2014), Non-seasonal allergic rhinitis, Osteoporosis (2017), SUI (stress urinary incontinence, female) (10/2006), and Vitamin D  deficiency disease (09/2006). Past Surgical History Patient  has a past surgical history that includes Hand surgery (Left, 10/2009); Combined hysterectomy vaginal w/ MMK / A&P repair (8016); Tonsillectomy (as child); Wisdom tooth extraction; Liposuction; LEFT HEART CATH AND CORONARY ANGIOGRAPHY (N/A, 08/31/2019); Breast excisional biopsy (Right, 1991); Left needle biopsy; and Breast biopsy (Left, 2021). Family History: Patient family history includes Breast cancer (age of onset: 5) in her cousin; Breast cancer (age of onset: 7 - 67) in her mother; Cancer (age of onset: 31) in her cousin; Cancer (age of onset: 37) in her maternal uncle; Cervical cancer (age of onset: 70) in her mother; Colon polyps in her son; Dementia in her mother; Endometriosis in her sister; Heart disease in her brother; Lung cancer (age of onset: 22) in her maternal uncle; Osteoarthritis in her mother; Skin cancer (age of onset: 63) in her son; Stroke in her brother and maternal grandmother; Throat cancer (age of  onset: 4) in her maternal uncle. Social History:  Patient  reports that she quit smoking about 52 years ago. Her smoking use included cigarettes. She started smoking about 60 years ago. She has a 2 pack-year smoking history. She has never used smokeless tobacco. She reports current alcohol use. She reports that she does not use drugs.  Review of Systems: Constitutional: negative for fever or malaise Ophthalmic: negative for photophobia, double vision or loss of vision Cardiovascular: negative for chest pain, dyspnea on exertion, or new LE swelling Respiratory: negative for SOB or persistent cough Gastrointestinal: negative for abdominal pain, change in bowel habits or melena Genitourinary: negative for dysuria or gross hematuria, no abnormal uterine bleeding or disharge Musculoskeletal: negative for new gait disturbance or muscular weakness Integumentary: negative for new or persistent rashes, no breast lumps Neurological: negative for TIA or stroke symptoms Psychiatric: negative for SI or delusions Allergic/Immunologic: negative for hives  Patient Care Team    Relationship Specialty Notifications Start End  Jodie Lavern CROME, MD PCP - General Family Medicine  12/28/19   [facility] Consulting Physician Otolaryngology  07/16/17   Glendia Randine CHRISTELLA DEVONNA (Inactive) Physician Assistant Hematology and Oncology  09/25/17   Kristie Lamprey, MD Consulting Physician Gastroenterology  07/01/19   Waddell Danelle ORN, MD Consulting Physician Cardiology  12/28/19   Jannis Kate Norris, MD Consulting Physician Obstetrics and Gynecology  12/28/19   Court Pulling, MD Consulting Physician Dermatology  12/28/19   Jenel Carlin POUR, MD (Inactive) Consulting Physician Neurology  12/28/19     Objective  Vitals: BP (!) 150/70   Pulse 63   Temp 97.8 F (36.6 C)   Ht 5' 5 (1.651 m)   Wt 121 lb 3.2 oz (55 kg)   LMP 08/18/1982 (Approximate)   SpO2 98%   BMI 20.17 kg/m  General:  Well  developed, well nourished, no acute distress  Psych:  Alert and orientedx3,normal mood and affect HEENT:  Normocephalic, atraumatic, non-icteric sclera,  supple neck without adenopathy, mass or thyromegaly Cardiovascular:  Normal S1, S2, RRR without gallop, rub or murmur Respiratory:  Good breath sounds bilaterally, CTAB with normal respiratory effort Gastrointestinal: normal bowel sounds, soft, non-tender, no noted masses. No HSM MSK: extremities without edema, joints without erythema or swelling Neurologic:    Mental status is normal.  Gross motor and sensory exams are normal.  No tremor  Commons side effects, risks, benefits, and alternatives for medications and treatment plan prescribed today were discussed, and the patient expressed understanding of the given instructions. Patient is instructed to call or message via MyChart if he/she has any questions or concerns regarding our treatment plan. No barriers to understanding were identified. We discussed Red Flag symptoms and signs in detail. Patient expressed understanding regarding what to do in case of urgent or emergency type symptoms.  Medication list was reconciled, printed and provided to the patient in AVS. Patient instructions and summary information was reviewed with the patient as documented in the AVS. This note was prepared with assistance of Dragon voice recognition software. Occasional wrong-word or sound-a-like substitutions may have occurred due to the inherent limitations of voice recognition software

## 2024-09-23 NOTE — Patient Instructions (Signed)
 Please return in 12 months for your annual complete physical; please come fasting.   I will release your lab results to you on your MyChart account with further instructions. You may see the results before I do, but when I review them I will send you a message with my report or have my assistant call you if things need to be discussed. Please reply to my message with any questions. Thank you!   If you have any questions or concerns, please don't hesitate to send me a message via MyChart or call the office at [phone]. Thank you for visiting with us  today! It's our pleasure caring for you.    VISIT SUMMARY: Today, you had your annual physical exam where we discussed several health concerns including muscle cramps, shortness of breath, palpitations, weight loss, and cold extremities. We also reviewed your general health maintenance and screenings.  YOUR PLAN: -CHRONIC LEG CRAMPS WITH SLEEP DISTURBANCE AND BRUISING: Chronic leg cramps can cause severe pain, sleep disturbances, and bruising. We discussed the importance of staying hydrated and doing regular stretching exercises. You may also try drinking tonic water with quinine to help with the cramps. We will check your muscle enzymes and electrolytes, and consider muscle relaxers and massage therapy if needed.  -VENTRICULAR PREMATURE DEPOLARIZATIONS (PVCS): PVCs are extra heartbeats that can cause palpitations. We will continue to monitor your condition and may adjust your medication if necessary.  -HYPOTHYROIDISM: Hypothyroidism is when your thyroid  gland doesn't produce enough hormones. Given your recent weight loss, we may need to adjust your thyroid  medication. Make sure to include protein and healthy fats in your diet.  -RAYNAUD'S PHENOMENON (SUSPECTED): Raynaud's phenomenon is a condition where cold or stress causes your extremities to turn blue or purple. We suspect you may have this condition based on your symptoms.  -GENERAL HEALTH  MAINTENANCE: We discussed the importance of regular health screenings and lifestyle modifications. We ordered a fecal occult blood test and discussed colonoscopy screening options. Staying hydrated and doing regular stretching exercises are important for your overall health.  INSTRUCTIONS: Please follow up with the pulmonary specialist in January as scheduled. Continue with the recommended hydration and stretching exercises. Try tonic water with quinine for muscle cramps and monitor your symptoms. We will review your muscle enzymes and electrolytes, and consider further treatments if necessary. If you notice any changes in your symptoms or have any concerns, please contact our office.                      Contains text generated by Abridge.                                 Contains text generated by Abridge.

## 2024-09-24 ENCOUNTER — Encounter: Payer: Self-pay | Admitting: Family Medicine

## 2024-09-24 LAB — IRON,TIBC AND FERRITIN PANEL
%SAT: 40 % (ref 16–45)
Ferritin: 135 ng/mL (ref 16–288)
Iron: 129 ug/dL (ref 45–160)
TIBC: 324 ug/dL (ref 250–450)

## 2024-09-28 ENCOUNTER — Ambulatory Visit: Payer: Self-pay | Admitting: Family Medicine

## 2024-09-28 ENCOUNTER — Other Ambulatory Visit: Payer: Self-pay

## 2024-09-28 DIAGNOSIS — E875 Hyperkalemia: Secondary | ICD-10-CM

## 2024-09-28 NOTE — Progress Notes (Signed)
 Please call patient:need to repeat a BMP due to possible high potassium level.  Please have pt back. Notify tammy. And let her know all other lab results are normal.

## 2024-09-30 ENCOUNTER — Other Ambulatory Visit (INDEPENDENT_AMBULATORY_CARE_PROVIDER_SITE_OTHER)

## 2024-09-30 DIAGNOSIS — E875 Hyperkalemia: Secondary | ICD-10-CM

## 2024-09-30 LAB — BASIC METABOLIC PANEL WITH GFR
BUN: 18 mg/dL (ref 6–23)
CO2: 27 meq/L (ref 19–32)
Calcium: 9 mg/dL (ref 8.4–10.5)
Chloride: 99 meq/L (ref 96–112)
Creatinine, Ser: 0.82 mg/dL (ref 0.40–1.20)
GFR: 71.42 mL/min (ref >60.00)
Glucose, Bld: 87 mg/dL (ref 70–99)
Potassium: 4 meq/L (ref 3.5–5.1)
Sodium: 135 meq/L (ref 135–145)

## 2024-10-01 ENCOUNTER — Ambulatory Visit: Payer: Self-pay | Admitting: Family Medicine

## 2024-10-01 NOTE — Progress Notes (Signed)
 See mychart note Dear Ms. Lumsden, Thank you for returning to recheck your potassium levels. Your blood test results are all normal now.  Sincerely, Dr. Jodie

## 2024-10-20 ENCOUNTER — Telehealth: Payer: Self-pay | Admitting: Student

## 2024-10-20 DIAGNOSIS — I493 Ventricular premature depolarization: Secondary | ICD-10-CM

## 2024-10-20 NOTE — Telephone Encounter (Signed)
 Pt is asking the heart monitor Dr. Waddell wanted her to wear be mailed so she can wear it in preporation for her 11/23/24 appt. Please advise.

## 2024-10-21 ENCOUNTER — Ambulatory Visit: Attending: Student

## 2024-10-21 DIAGNOSIS — I493 Ventricular premature depolarization: Secondary | ICD-10-CM

## 2024-10-21 NOTE — Progress Notes (Unsigned)
 Enrolled patient for a 7 day Zio XT monitor to be mailed to patients home   Sharon Harrell to read

## 2024-10-21 NOTE — Telephone Encounter (Signed)
 Called and spoke to pt. Zio order placed. Informed pt to check her Mychart messages. Reviewed basic instructions with pt; she is familiar with the monitor, as she had one last year. Will see [place], PA on 11/23/24.

## 2024-11-04 ENCOUNTER — Other Ambulatory Visit: Payer: Self-pay | Admitting: Family Medicine

## 2024-11-16 DIAGNOSIS — I493 Ventricular premature depolarization: Secondary | ICD-10-CM

## 2024-11-17 ENCOUNTER — Ambulatory Visit: Payer: Self-pay | Admitting: Student

## 2024-11-19 ENCOUNTER — Other Ambulatory Visit: Payer: Self-pay | Admitting: Family Medicine

## 2024-11-23 ENCOUNTER — Ambulatory Visit: Attending: Student | Admitting: Student

## 2024-11-23 ENCOUNTER — Encounter: Payer: Self-pay | Admitting: Student

## 2024-11-23 VITALS — BP 134/72 | HR 64 | Ht 65.0 in | Wt 123.4 lb

## 2024-11-23 DIAGNOSIS — R9431 Abnormal electrocardiogram [ECG] [EKG]: Secondary | ICD-10-CM

## 2024-11-23 DIAGNOSIS — I493 Ventricular premature depolarization: Secondary | ICD-10-CM

## 2024-11-23 NOTE — Patient Instructions (Signed)
 Medication Instructions:  Your physician recommends that you continue on your current medications as directed. Please refer to the Current Medication list given to you today.  *If you need a refill on your cardiac medications before your next appointment, please call your pharmacy*  Lab Work: None ordered If you have labs (blood work) drawn today and your tests are completely normal, you will receive your results only by: MyChart Message (if you have MyChart) OR A paper copy in the mail If you have any lab test that is abnormal or we need to change your treatment, we will call you to review the results.  Follow-Up: At [facility], you and your health needs are our priority.  As part of our continuing mission to provide you with exceptional heart care, our providers are all part of one team.  This team includes your primary Cardiologist (physician) and Advanced Practice Providers or APPs (Physician Assistants and Nurse Practitioners) who all work together to provide you with the care you need, when you need it.  Your next appointment:   Next available  Provider:   Donnice Primus, MD

## 2024-11-23 NOTE — Progress Notes (Addendum)
" °  Electrophysiology Office Note:   Date:  11/23/2024  ID:  Sharon Harrell, DOB 1951-05-20, MRN [id number]  Primary Cardiologist: None Electrophysiologist: Donnice DELENA Primus, MD   Electrophysiologist:  Donnice DELENA Primus, MD      History of Present Illness:   Sharon Harrell is a 74 y.o. female with h/o PVCs and DOE seen today for routine electrophysiology followup.   Seen today in follow up for Dr. Waddell.   Report initially came through with significant improvement of her PVCs.  Subsequent review shows that she had >21% of PVCs during her monitor wear time.   Pt has continued fatigue and is more aware of her PVCs then she has been prior.       She is very frustrated on the conflicting reports and requests to see the MD only to review treatment options.   Review of systems complete and found to be negative unless listed in HPI.   EP Information / Studies Reviewed:    EKG is not ordered today. EKG from 03/09/2024 reviewed which showed NSR with frequent PVCs       Arrhythmia/Device History Monitor 10/2024  Initial report read as 1.1% PAC's and less than 1% PVC's.  > Full Zio report shows 21% PVCs.    Have sent for correction.      Physical Exam:   VS:  BP 134/72   Pulse 64   Ht 5' 5 (1.651 m)   Wt 123 lb 6.4 oz (56 kg)   LMP 08/18/1982   SpO2 98%   BMI 20.53 kg/m    Wt Readings from Last 3 Encounters:  11/23/24 123 lb 6.4 oz (56 kg)  09/23/24 121 lb 3.2 oz (55 kg)  03/09/24 123 lb 12.8 oz (56.2 kg)    GEN: No acute distress Normal respiratory effort.  ASSESSMENT AND PLAN:    PVCs Burden up to 21% on full report -> Have reached out to have corrected. Pt has previously tried to increase her Toprol  without improvement of burden or symptoms.  Dr. Waddell had planned to consider flecainide  vs ablation if burden continued to trend up.   Pt elected no further discussion of treatment options with this provider to due to conflicting reports and will be scheduled ASAP to see  MD only.      Exertional dyspnea Stable, multi-factorial Stress test unremarkable and cath 08/2019 with no CAD.  Follow up with Dr. Primus next available.  No charge for todays visit.   Signed, Ozell Prentice Passey, PA-C  "

## 2024-11-24 ENCOUNTER — Telehealth: Payer: Self-pay | Admitting: Student in an Organized Health Care Education/Training Program

## 2024-11-24 ENCOUNTER — Telehealth: Payer: Self-pay | Admitting: *Deleted

## 2024-11-24 DIAGNOSIS — I5021 Acute systolic (congestive) heart failure: Secondary | ICD-10-CM

## 2024-11-24 DIAGNOSIS — I493 Ventricular premature depolarization: Secondary | ICD-10-CM

## 2024-11-24 MED ORDER — FLECAINIDE ACETATE 50 MG PO TABS: 50.0000 mg | ORAL_TABLET | Freq: Two times a day (BID) | ORAL | 0 refills | Status: DC

## 2024-11-24 NOTE — Telephone Encounter (Signed)
 Spoke with patient to discuss concerns related to her heart monitor results. Explained that a ticket has been opened for result correction. Pt reports that she spoke with Dr. Almetta over the phone and he reviewed the actual results with her, which was helpful. She has an upcoming appointment with Dr. Almetta on 12/07/24. Advised I will reach back out once the monitor result has been corrected. Pt verbalizes understanding and denies additional needs at this time.

## 2024-11-24 NOTE — Telephone Encounter (Signed)
 Spoke to Mrs. Kalil and her husband at length tonight about management options for her PVCs.  She likely has shortness of breath and potentially fatigue attributable to her frequent PVCs although she understands it is difficult to assess this without reducing the PVC burden.  We discussed different options for management of her PVCs including continued observation, antiarrhythmic options and ablation.  First I would like to get a TTE to assess for LV function as she has stable but frequent dyspnea on exertion.  I explained that I think her PVC origin is likely LCC which means ~70% success.  The 3 factors to consider for ablation success are PVC frequency, ability to reach the focus, and depth of the focus.  Her PVC frequency and ability to reach the [facility] shouldn't be an issue.  Whether I am able to terminate or just suppress the PVCs he is completely dependent on whether it is endocardial or mid myocardial origin.  She would like to start with antiarrhythmic options first and see if they have any effect.  Will start on flecainide  50 mg twice daily in addition to her currently prescribed Toprol -XL 25 mg daily.  I have sent her 30 days to the pharmacy.  Plan to uptitrate at our visit in 2 weeks if she does not have any side effects related to the medication.  She and her husband understand the plan and are agreeable to proceed.  No obstructive CAD on cCT in 2024. Will get an exercise treadmill once she has been on the flecainide  for 3-4 weeks.

## 2024-11-25 ENCOUNTER — Other Ambulatory Visit: Payer: Self-pay | Admitting: Student in an Organized Health Care Education/Training Program

## 2024-11-25 DIAGNOSIS — I493 Ventricular premature depolarization: Secondary | ICD-10-CM

## 2024-12-01 ENCOUNTER — Encounter: Payer: Self-pay | Admitting: Podiatry

## 2024-12-01 ENCOUNTER — Ambulatory Visit: Admitting: Podiatry

## 2024-12-01 DIAGNOSIS — D2371 Other benign neoplasm of skin of right lower limb, including hip: Secondary | ICD-10-CM

## 2024-12-01 DIAGNOSIS — D2372 Other benign neoplasm of skin of left lower limb, including hip: Secondary | ICD-10-CM | POA: Diagnosis not present

## 2024-12-01 NOTE — Progress Notes (Signed)
"  °  Subjective:  Patient ID: Sharon Harrell, female    DOB: 1951/06/05,   MRN: [id number]  Chief Complaint  Patient presents with   Plantar Warts    I get these spurs or something on the bottom of my feet.  They're all over my feet.  I think they are seed warts.  I also want to know why my feet turn bluish/black.    74 y.o. female presents for concern of multiple lesions on the bottom of both of her feet.  She relates they are painful and tender to palpation.  She has been seen in the past for them and had them trimmed out and this seems to help.   Denies any other pedal complaints. Denies n/v/f/c.   Past Medical History:  Diagnosis Date   Cataract    DDD (degenerative disc disease), cervical 2006   Depression 11/2004   resolved   Diverticulosis of colon 12/28/2019   Multiple large mouth colonoscopy finding   Dysphonia    Frequent PVCs    Hyperlipidemia    Hypothyroidism    Nocturnal leg cramps 06/06/2014   Non-seasonal allergic rhinitis    Osteoporosis 2017   SUI (stress urinary incontinence, female) 10/2006   Vitamin D  deficiency disease 09/2006    Objective:  Physical Exam: Vascular: DP/PT pulses 2/4 bilateral. CFT <3 seconds. Normal hair growth on digits. No edema.  Skin. No lacerations or abrasions bilateral feet.  Hyperkeratotic core lesions noted to plantar fourth digit on the right and plantar second digit on the left as well as plantar fifth metatarsal heads and plantar second metatarsal heads bilateral.  Cord lesions with disruption of skin lines noted. Musculoskeletal: MMT 5/5 bilateral lower extremities in DF, PF, Inversion and Eversion. Deceased ROM in DF of ankle joint.  Neurological: Sensation intact to light touch.   Assessment:   1. Benign neoplasm of skin of right foot   2. Benign neoplasm of skin of foot, left      Plan:  Patient was evaluated and treated and all questions answered. -Discussed benign skin lesions with patient and treatment options.   -Hyperkeratotic tissue was debrided with chisel without incident.  -Applied salycylic acid treatment to area with dressing. Advised to remove bandaging tomorrow.  -Encouraged daily moisturizing -Discussed use of pumice stone -Advised good supportive shoes and inserts -Patient to return to office as needed or sooner if condition worsens.   Asberry Failing, DPM    "

## 2024-12-07 ENCOUNTER — Encounter: Payer: Self-pay | Admitting: Student in an Organized Health Care Education/Training Program

## 2024-12-07 ENCOUNTER — Ambulatory Visit: Admitting: Student in an Organized Health Care Education/Training Program

## 2024-12-07 VITALS — BP 120/74 | HR 51 | Ht 65.0 in | Wt 125.6 lb

## 2024-12-07 DIAGNOSIS — I493 Ventricular premature depolarization: Secondary | ICD-10-CM | POA: Diagnosis present

## 2024-12-07 DIAGNOSIS — Z79899 Other long term (current) drug therapy: Secondary | ICD-10-CM | POA: Insufficient documentation

## 2024-12-07 NOTE — Patient Instructions (Signed)
 Medication Instructions:  Your physician recommends that you continue on your current medications as directed. Please refer to the Current Medication list given to you today.  *If you need a refill on your cardiac medications before your next appointment, please call your pharmacy*  Lab Work: None ordered.  If you have labs (blood work) drawn today and your tests are completely normal, you will receive your results only by: MyChart Message (if you have MyChart) OR A paper copy in the mail If you have any lab test that is abnormal or we need to change your treatment, we will call you to review the results.  Testing/Procedures: Your physician has requested that you have an exercise tolerance test. For further information please visit [email or web address] Please also follow instruction sheet, as given.   Follow-Up: At [facility], you and your health needs are our priority.  As part of our continuing mission to provide you with exceptional heart care, our providers are all part of one team.  This team includes your primary Cardiologist (physician) and Advanced Practice Providers or APPs (Physician Assistants and Nurse Practitioners) who all work together to provide you with the care you need, when you need it.  Your next appointment:   12 months with Dr Almetta

## 2024-12-07 NOTE — Progress Notes (Signed)
 " Cardiology Office Note   Date: 12/07/24 ID:  Sharon Harrell, DOB [birth year], MRN [id number] PCP: Jodie Lavern CROME, MD  [facility] Providers Cardiologist:  None Electrophysiologist:  Donnice DELENA Primus, MD   History of Present Illness Sharon Harrell is a 74 y.o. female with symptomatic LCC origin PVCs, hypothyroidism, HLD, prior diverticulosis, and degenerative disc disease who presents for arrhythmia management.  Discussed the use of AI scribe software for clinical note transcription with the patient, who gave verbal consent to proceed. History of Present Illness Sharon Harrell is a 74 year old female with premature ventricular contractions who presents for follow-up regarding her response to flecainide  therapy. She is accompanied by Ubaldo.  She notes a positive impact from the medication, as she is less winded when walking and can keep up with her husband. There is a reduction in the frequency of PVCs, especially when lying down, and she generally feels less short of breath.  She experiences increased hunger, which she attributes to the medication, although she acknowledges she was hungry before starting it. Additionally, she reports occasional mild headaches, which are not severe or constant, and have improved over time.  She is currently taking flecainide  50 mg twice a day, at approximately 9 AM/9 PM. She also takes metoprolol .  Her past medical history includes a previous echocardiogram that indicated grade one diastolic dysfunction. She has not had an echocardiogram in the last six years.  ROS: palpitations  Studies Reviewed  ECG review 12/07/24: SB 51, PR 146, QRS 88, QT/c 464/427, no PVCs (post flecainide )  03/09/24: (11:33:48) NSR 88, PR 150, QRS 84, QT/c 380/459, ventricular trigeminy (pre flecainide )   Zio monitor Result date: 10/26/24-11/02/24 NSR 49-110 bpm, avg 71 bpm PACs 1.1% PVCs 21% Brief runs of SVT, likely AT Ventricular bigeminy and AT correlated to  symptoms Patient had a min HR of 35 bpm, max HR of 200 bpm, and avg HR of 75 bpm. Predominant underlying rhythm was Sinus Rhythm. Slight P wave morphology changes were noted. Intermittent Bundle Branch Block was present. 578 Supraventricular Tachycardia runs occurred, the run with the fastest interval lasting 8 beats with a max rate of 200 bpm, the longest lasting 17 beats with an avg rate of 113 bpm. Some episodes of Supraventricular Tachycardia may be possible Atrial Tachycardia with variable block. Supraventricular Tachycardia was detected within + / - 45 seconds of symptomatic patient event( s) . Isolated SVEs were occasional ( 1. 1% , 8229) , SVE Couplets were occasional ( 1. 1% , 3897) , and SVE Triplets were rare ( < 1. 0% , 1228) . Isolated VEs were frequent ( 21. 0% , 154614) , VE Couplets were rare ( < 1. 0% , 3589) , and VE Triplets were rare ( < 1. 0% , 1906) . Ventricular Bigeminy and Trigeminy were present.  cCT  Result date: 01/14/03 1. Minimal CAD, <25% stenosis, CADRADS 1. 2. Coronary calcium  score is 2, which places the patient in the 41st percentile for age and sex matched control. 3. Normal coronary origins with right dominance.  Physical Exam VS:  BP 120/74 (BP Location: Right Arm, Patient Position: Sitting, Cuff Size: Normal)   Pulse (!) 51   Ht 5' 5 (1.651 m)   Wt 125 lb 9.6 oz (57 kg)   LMP 08/18/1982   SpO2 98%   BMI 20.90 kg/m   Wt Readings from Last 3 Encounters:  12/07/24 125 lb 9.6 oz (57 kg)  11/23/24 123 lb 6.4 oz (  56 kg)  09/23/24 121 lb 3.2 oz (55 kg)    GEN: Well nourished, well developed in no acute distress CARDIAC: RRR, no murmurs, rubs, gallops RESPIRATORY:  Clear to auscultation without rales, wheezing or rhonchi  EXTREMITIES:  No edema; No deformity   ASSESSMENT AND PLAN Sharon Harrell is a 73 y.o. female with symptomatic LCC origin PVCs, hypothyroidism, HLD, prior diverticulosis, and degenerative disc disease who presents for arrhythmia  management. Assessment & Plan Frequent premature ventricular contractions (PVCs) PVCs controlled with flecainide  50 mg BID. EKG shows no PVCs. Origin likely LCC of aortic valve. Ablation considered if flecainide  fails. Long-term flecainide  effective; monitor with echocardiogram and stress test. - Continue flecainide  50 mg BID. - Ordered echocardiogram. - Scheduled stress test. - Continue metoprolol . - Sent 90-day prescription for flecainide  with refills.  Grade 1 diastolic dysfunction Previously noted, likely benign, no intervention needed. Flecainide  does not affect progression. - Monitor with echocardiogram.  Dispo: RTC 1 yr [place] f/u   Signed, Donnice DELENA Primus, MD  "

## 2024-12-08 ENCOUNTER — Other Ambulatory Visit (HOSPITAL_COMMUNITY)

## 2024-12-15 ENCOUNTER — Other Ambulatory Visit: Payer: Self-pay | Admitting: Family Medicine

## 2024-12-15 DIAGNOSIS — Z1231 Encounter for screening mammogram for malignant neoplasm of breast: Secondary | ICD-10-CM

## 2024-12-18 ENCOUNTER — Other Ambulatory Visit: Payer: Self-pay | Admitting: Student in an Organized Health Care Education/Training Program

## 2024-12-18 DIAGNOSIS — I493 Ventricular premature depolarization: Secondary | ICD-10-CM

## 2024-12-20 ENCOUNTER — Encounter: Payer: Self-pay | Admitting: Student in an Organized Health Care Education/Training Program

## 2024-12-30 ENCOUNTER — Ambulatory Visit (HOSPITAL_COMMUNITY)

## 2025-01-05 ENCOUNTER — Ambulatory Visit: Admitting: Podiatry

## 2025-01-10 ENCOUNTER — Ambulatory Visit

## 2025-01-10 DIAGNOSIS — Z1231 Encounter for screening mammogram for malignant neoplasm of breast: Secondary | ICD-10-CM

## 2025-01-13 ENCOUNTER — Encounter (HOSPITAL_COMMUNITY)

## 2025-09-26 ENCOUNTER — Ambulatory Visit: Admitting: Family Medicine
